# Patient Record
Sex: Female | Born: 1950 | Race: Black or African American | Hispanic: No | Marital: Single | Smoking: Never smoker
Health system: Southern US, Community
[De-identification: ages and names within clinical notes are randomized; demographics above are authoritative.]

## PROBLEM LIST (undated history)

## (undated) DIAGNOSIS — I10 Essential (primary) hypertension: Secondary | ICD-10-CM

## (undated) DIAGNOSIS — E785 Hyperlipidemia, unspecified: Secondary | ICD-10-CM

## (undated) DIAGNOSIS — F0390 Unspecified dementia without behavioral disturbance: Secondary | ICD-10-CM

## (undated) DIAGNOSIS — E119 Type 2 diabetes mellitus without complications: Secondary | ICD-10-CM

---

## 2015-07-28 ENCOUNTER — Emergency Department (HOSPITAL_COMMUNITY)
Admission: EM | Admit: 2015-07-28 | Discharge: 2015-07-28 | Disposition: A | Discharge status: Home / Self Care | Payer: Self-pay | Attending: Emergency Medicine | Admitting: Emergency Medicine

## 2015-07-28 ENCOUNTER — Encounter (HOSPITAL_COMMUNITY): Payer: Self-pay

## 2015-07-28 DIAGNOSIS — Z79899 Other long term (current) drug therapy: Secondary | ICD-10-CM | POA: Insufficient documentation

## 2015-07-28 DIAGNOSIS — Z711 Person with feared health complaint in whom no diagnosis is made: Secondary | ICD-10-CM

## 2015-07-28 DIAGNOSIS — E785 Hyperlipidemia, unspecified: Secondary | ICD-10-CM | POA: Insufficient documentation

## 2015-07-28 DIAGNOSIS — I10 Essential (primary) hypertension: Secondary | ICD-10-CM | POA: Insufficient documentation

## 2015-07-28 DIAGNOSIS — E119 Type 2 diabetes mellitus without complications: Secondary | ICD-10-CM | POA: Insufficient documentation

## 2015-07-28 DIAGNOSIS — Z09 Encounter for follow-up examination after completed treatment for conditions other than malignant neoplasm: Secondary | ICD-10-CM | POA: Insufficient documentation

## 2015-07-28 DIAGNOSIS — F319 Bipolar disorder, unspecified: Secondary | ICD-10-CM | POA: Insufficient documentation

## 2015-07-28 DIAGNOSIS — Z7982 Long term (current) use of aspirin: Secondary | ICD-10-CM | POA: Insufficient documentation

## 2015-07-28 HISTORY — DX: Hyperlipidemia, unspecified: E78.5

## 2015-07-28 HISTORY — DX: Essential (primary) hypertension: I10

## 2015-07-28 LAB — CBC WITH DIFFERENTIAL/PLATELET
BASOS ABS: 0 10*3/uL (ref 0.0–0.1)
BASOS PCT: 0 %
EOS PCT: 0 %
Eosinophils Absolute: 0 10*3/uL (ref 0.0–0.7)
HCT: 38.8 % (ref 36.0–46.0)
Hemoglobin: 12 g/dL (ref 12.0–15.0)
Lymphocytes Relative: 34 %
Lymphs Abs: 1.6 10*3/uL (ref 0.7–4.0)
MCH: 27.6 pg (ref 26.0–34.0)
MCHC: 30.9 g/dL (ref 30.0–36.0)
MCV: 89.2 fL (ref 78.0–100.0)
MONO ABS: 0.2 10*3/uL (ref 0.1–1.0)
Monocytes Relative: 5 %
NEUTROS ABS: 2.8 10*3/uL (ref 1.7–7.7)
Neutrophils Relative %: 61 %
PLATELETS: 187 10*3/uL (ref 150–400)
RBC: 4.35 MIL/uL (ref 3.87–5.11)
RDW: 13.1 % (ref 11.5–15.5)
WBC: 4.7 10*3/uL (ref 4.0–10.5)

## 2015-07-28 LAB — LITHIUM LEVEL: Lithium Lvl: 0.22 mmol/L — ABNORMAL LOW (ref 0.60–1.20)

## 2015-07-28 LAB — I-STAT CHEM 8, ED
BUN: 17 mg/dL (ref 6–20)
CALCIUM ION: 1.22 mmol/L (ref 1.13–1.30)
CHLORIDE: 106 mmol/L (ref 101–111)
CREATININE: 0.9 mg/dL (ref 0.44–1.00)
GLUCOSE: 98 mg/dL (ref 65–99)
HCT: 39 % (ref 36.0–46.0)
Hemoglobin: 13.3 g/dL (ref 12.0–15.0)
Potassium: 4 mmol/L (ref 3.5–5.1)
Sodium: 142 mmol/L (ref 135–145)
TCO2: 26 mmol/L (ref 0–100)

## 2015-07-28 LAB — COMPREHENSIVE METABOLIC PANEL
ALBUMIN: 3.6 g/dL (ref 3.5–5.0)
ALT: 34 U/L (ref 14–54)
AST: 21 U/L (ref 15–41)
Alkaline Phosphatase: 89 U/L (ref 38–126)
Anion gap: 8 (ref 5–15)
BUN: 16 mg/dL (ref 6–20)
CHLORIDE: 105 mmol/L (ref 101–111)
CO2: 28 mmol/L (ref 22–32)
Calcium: 9.7 mg/dL (ref 8.9–10.3)
Creatinine, Ser: 0.91 mg/dL (ref 0.44–1.00)
GFR calc Af Amer: >60 mL/min (ref >60)
GFR calc non Af Amer: >60 mL/min (ref >60)
GLUCOSE: 104 mg/dL — AB (ref 65–99)
POTASSIUM: 4.1 mmol/L (ref 3.5–5.1)
Sodium: 141 mmol/L (ref 135–145)
Total Bilirubin: 0.5 mg/dL (ref 0.3–1.2)
Total Protein: 6.4 g/dL — ABNORMAL LOW (ref 6.5–8.1)

## 2015-07-28 LAB — I-STAT TROPONIN, ED: Troponin i, poc: 0 ng/mL (ref 0.00–0.08)

## 2015-07-28 NOTE — Discharge Instructions (Signed)
Your potassium level is normal today.   See your doctor.   Return to ER if you have vomiting, diarrhea, chest pain, shortness of breath, fever.

## 2015-07-28 NOTE — ED Notes (Signed)
[facility]- pt here from [facility] with reported elevated potassium. Pt has no complaints but staff reported her K+ level was 10.

## 2015-07-28 NOTE — ED Provider Notes (Signed)
CSN: [id number]     Arrival date & time 07/28/15  1525 History   First MD Initiated Contact with Patient 07/28/15 1532     Chief Complaint  Patient presents with  . Abnormal Lab     (Consider location/radiation/quality/duration/timing/severity/associated sxs/prior Treatment) The history is provided by the EMS personnel.  Jenna Powell is a 64 y.o. female hx of HTN, HL, DM, bipolar here with elevated potassium. Patient had labs drawn recently and results came that K was greater than 10. Patient unable to give much history. She denies vomiting or diarrhea or chest pain or shortness of breath. Patient is from [place] home.   Level V caveat- condition of patient    Past Medical History  Diagnosis Date  . Hypertension   . Hyperlipidemia    History reviewed. No pertinent past surgical history. History reviewed. No pertinent family history. Social History  Substance Use Topics  . Smoking status: Never Smoker   . Smokeless tobacco: None  . Alcohol Use: No   OB History    No data available     Review of Systems  All other systems reviewed and are negative.     Allergies  Review of patient's allergies indicates no known allergies.  Home Medications   Prior to Admission medications   Medication Sig Start Date End Date Taking? Authorizing Provider  aspirin 81 MG EC tablet Take 81 mg by mouth daily. Swallow whole.   Yes Historical Provider, MD  benztropine (COGENTIN) 1 MG tablet Take 1 mg by mouth 2 (two) times daily.   Yes Historical Provider, MD  Calcium Polycarbophil (FIBER LAXATIVE PO) Take 1 tablet by mouth 2 (two) times daily.   Yes Historical Provider, MD  cloZAPine (CLOZARIL) 100 MG tablet Take 100-200 mg by mouth 2 (two) times daily. Takes 1 tab in am and  2 tabs in pm   Yes Historical Provider, MD  docusate (COLACE) 50 MG/5ML liquid Take 100 mg by mouth 2 (two) times daily as needed for mild constipation.   Yes Historical Provider, MD  lithium carbonate  (LITHOBID) 300 MG CR tablet Take 600 mg by mouth at bedtime.   Yes Historical Provider, MD  metFORMIN (GLUCOPHAGE) 1000 MG tablet Take 1,000 mg by mouth at bedtime.   Yes Historical Provider, MD  mirabegron ER (MYRBETRIQ) 50 MG TB24 tablet Take 50 mg by mouth daily.   Yes Historical Provider, MD  pravastatin (PRAVACHOL) 20 MG tablet Take 20 mg by mouth every evening.    Yes Historical Provider, MD  ramipril (ALTACE) 2.5 MG capsule Take 2.5 mg by mouth daily.   Yes Historical Provider, MD  risperiDONE (RISPERDAL) 3 MG tablet Take 3 mg by mouth every morning.   Yes Historical Provider, MD  risperidone (RISPERDAL) 4 MG tablet Take 4 mg by mouth at bedtime.   Yes Historical Provider, MD  sertraline (ZOLOFT) 50 MG tablet Take 50 mg by mouth daily.   Yes Historical Provider, MD   BP 114/86 mmHg  Pulse 89  Temp(Src) 98.7 F (37.1 C) (Oral)  Resp 18  SpO2 100% Physical Exam  Constitutional:  Chronically ill, NAD   HENT:  Head: Normocephalic.  Mouth/Throat: Oropharynx is clear and moist.  Eyes: Conjunctivae are normal. Pupils are equal, round, and reactive to light.  Neck: Normal range of motion. Neck supple.  Cardiovascular: Normal rate, regular rhythm and normal heart sounds.   Pulmonary/Chest: Effort normal and breath sounds normal. No respiratory distress. She has no wheezes. She has no  rales.  Abdominal: Soft. Bowel sounds are normal. She exhibits no distension. There is no tenderness. There is no rebound and no guarding.  Musculoskeletal: Normal range of motion. She exhibits no edema or tenderness.  Neurological:  Alert, moving all extremities   Skin: Skin is warm and dry.  Psychiatric:  Unable   Nursing note and vitals reviewed.   ED Course  Procedures (including critical care time) Labs Review Labs Reviewed  COMPREHENSIVE METABOLIC PANEL - Abnormal; Notable for the following:    Glucose, Bld 104 (*)    Total Protein 6.4 (*)    All other components within normal limits   LITHIUM LEVEL - Abnormal; Notable for the following:    Lithium Lvl 0.22 (*)    All other components within normal limits  CBC WITH DIFFERENTIAL/PLATELET  I-STAT CHEM 8, ED  I-STAT TROPOININ, ED    Imaging Review No results found. I have personally reviewed and evaluated these images and lab results as part of my medical decision-making.   EKG Interpretation   Date/Time:  Saturday July 28 2015 15:59:28 EST Ventricular Rate:  87 PR Interval:  179 QRS Duration: 84 QT Interval:  295 QTC Calculation: 355 R Axis:   50 Text Interpretation:  Sinus rhythm Borderline low voltage, extremity leads  Abnormal R-wave progression, early transition No previous ECGs available  Confirmed by YAO  MD, DAVID ([id number]) on 07/28/2015 4:03:06 PM      MDM   Final diagnoses:  None   Jenna Powell is a 64 y.o. female here with hyperkalemia. Hemodynamically stable. Not sure if its hemolyzed or not. Will recheck labs, EKG.   5:16 PM  K 4.0. Lithium 0.22 slightly subtherapeutic. Stable for dc.     Wandra Arthurs, MD 07/28/15 [phone]

## 2015-07-28 NOTE — ED Notes (Signed)
PT DEPARTED IN NAD.

## 2015-08-21 ENCOUNTER — Encounter (HOSPITAL_COMMUNITY): Payer: Self-pay | Admitting: *Deleted

## 2015-08-21 ENCOUNTER — Inpatient Hospital Stay (HOSPITAL_COMMUNITY): Payer: Medicare Other

## 2015-08-21 ENCOUNTER — Inpatient Hospital Stay (HOSPITAL_COMMUNITY)
Admission: AD | Admit: 2015-08-21 | Discharge: 2015-08-21 | Disposition: A | Discharge status: Skilled Nursing Facility | Payer: Medicare Other | Source: Ambulatory Visit | Attending: Family Medicine | Admitting: Family Medicine

## 2015-08-21 DIAGNOSIS — N362 Urethral caruncle: Secondary | ICD-10-CM

## 2015-08-21 DIAGNOSIS — I10 Essential (primary) hypertension: Secondary | ICD-10-CM | POA: Diagnosis not present

## 2015-08-21 DIAGNOSIS — R319 Hematuria, unspecified: Secondary | ICD-10-CM | POA: Diagnosis present

## 2015-08-21 DIAGNOSIS — N950 Postmenopausal bleeding: Secondary | ICD-10-CM | POA: Diagnosis not present

## 2015-08-21 DIAGNOSIS — N814 Uterovaginal prolapse, unspecified: Secondary | ICD-10-CM | POA: Diagnosis not present

## 2015-08-21 DIAGNOSIS — E785 Hyperlipidemia, unspecified: Secondary | ICD-10-CM | POA: Insufficient documentation

## 2015-08-21 LAB — URINE MICROSCOPIC-ADD ON

## 2015-08-21 LAB — URINALYSIS, ROUTINE W REFLEX MICROSCOPIC
BILIRUBIN URINE: NEGATIVE
Glucose, UA: NEGATIVE mg/dL
KETONES UR: NEGATIVE mg/dL
NITRITE: NEGATIVE
PH: 7 (ref 5.0–8.0)
PROTEIN: NEGATIVE mg/dL
Specific Gravity, Urine: 1.015 (ref 1.005–1.030)

## 2015-08-21 MED ORDER — SULFAMETHOXAZOLE-TRIMETHOPRIM 800-160 MG PO TABS: 1.0000 | ORAL_TABLET | Freq: Two times a day (BID) | ORAL | Status: DC

## 2015-08-21 NOTE — Progress Notes (Signed)
Jenna Powell CNM in to discuss test results and d/c plan with pt. Will send written info with pt to Assisted living facility.

## 2015-08-21 NOTE — MAU Note (Addendum)
Seeing blood in urine for couple days. Pt arrived via stretcher from assisted living facility. Pt speaks French-Creole but does speak [place] as well.

## 2015-08-21 NOTE — Progress Notes (Signed)
Pt d/c with [facility] to return to [facility]. EMS personnel has d/c papers and prescription. Report called to RN at [facility] by M. Jimmye Norman CNM

## 2015-08-21 NOTE — MAU Provider Note (Signed)
Chief Complaint:  Hematuria  First Provider Initiated Contact at 1200    HPI  Jenna Powell is a 65 y.o. G7P7 who presents to maternity admissions reporting bleeding when urinating.  Has some blood on clothing, but is thinking it is from urination.  States it started a few days ago. Denies pain or difficulty urinating.  She reports no vaginal bleeding, vaginal itching/burning, urinary symptoms, h/a, dizziness, n/v, or fever/chills.    She arrived by ambulance from [facility].   RN Note: Seeing blood in urine for couple days. Pt arrived via stretcher from assisted living facility. Pt speaks French-Creole but does speak [place] as well.          Past Medical History: Past Medical History  Diagnosis Date  . Hypertension   . Hyperlipidemia     Past obstetric history: OB History  Gravida Para Term Preterm AB SAB TAB Ectopic Multiple Living  7         7    # Outcome Date GA Lbr Len/2nd Weight Sex Delivery Anes PTL Lv  7 Gravida           6 Gravida           5 Gravida           4 Gravida           3 Gravida           2 Gravida           1 [place]               Past Surgical History: History reviewed. No pertinent past surgical history.  Family History: History reviewed. No pertinent family history.  Social History: Social History  Substance Use Topics  . Smoking status: Never Smoker   . Smokeless tobacco: None  . Alcohol Use: No    Allergies: No Known Allergies  Meds:  Prescriptions prior to admission  Medication Sig Dispense Refill Last Dose  . aspirin 81 MG EC tablet Take 81 mg by mouth daily. Swallow whole.   08/21/2015 at Unknown time  . benztropine (COGENTIN) 1 MG tablet Take 1 mg by mouth 2 (two) times daily.   08/21/2015 at Unknown time  . Calcium Carbonate-Vitamin D (CALCIUM 600+D) 600-400 MG-UNIT tablet Take 1 tablet by mouth every 12 (twelve) hours.   08/21/2015 at Unknown time  . Calcium Polycarbophil (FIBER LAXATIVE PO) Take 1 tablet by mouth 2  (two) times daily.   08/21/2015 at Unknown time  . cloZAPine (CLOZARIL) 100 MG tablet Take 100-200 mg by mouth 2 (two) times daily. Takes 1 tab in am and  2 tabs in pm   08/21/2015 at Unknown time  . docusate (COLACE) 50 MG/5ML liquid Take 100 mg by mouth 3 (three) times daily.    08/21/2015 at Unknown time  . donepezil (ARICEPT) 10 MG tablet Take 10 mg by mouth at bedtime.   08/20/2015 at Unknown time  . lithium carbonate (LITHOBID) 300 MG CR tablet Take 600 mg by mouth at bedtime.   08/20/2015 at Unknown time  . metFORMIN (GLUCOPHAGE) 1000 MG tablet Take 1,000 mg by mouth at bedtime.   08/20/2015 at Unknown time  . metoprolol tartrate (LOPRESSOR) 25 MG tablet Take 12.5 mg by mouth 2 (two) times daily.   08/21/2015 at Unknown time  . mirabegron ER (MYRBETRIQ) 50 MG TB24 tablet Take 50 mg by mouth daily.   08/20/2015 at Unknown time  . pravastatin (PRAVACHOL) 20 MG tablet Take 20  mg by mouth every evening.    08/20/2015 at Unknown time  . ramipril (ALTACE) 2.5 MG capsule Take 2.5 mg by mouth daily.   08/21/2015 at Unknown time  . risperidone (RISPERDAL) 4 MG tablet Take 4 mg by mouth 2 (two) times daily.    08/21/2015 at Unknown time  . sertraline (ZOLOFT) 50 MG tablet Take 50 mg by mouth daily.   08/21/2015 at Unknown time   I have reviewed patient's Past Medical Hx, Surgical Hx, Family Hx, Social Hx, medications and allergies.   ROS:  Review of Systems  Constitutional: Negative for fever, chills and fatigue.  Gastrointestinal: Negative for nausea, vomiting, abdominal pain, diarrhea and constipation.  Genitourinary: Positive for vaginal bleeding (questionable if it is vaginal or urinary). Negative for dysuria, frequency, flank pain, vaginal discharge, difficulty urinating and pelvic pain.  Musculoskeletal: Negative for myalgias and back pain.  Other systems negative  Physical Exam  Patient Vitals for the past 24 hrs:  BP Temp Pulse Resp SpO2  08/21/15 1118 - - - - 100 %  08/21/15 1113 106/55 mmHg -  73 - -  08/21/15 1111 - 99.2 F (37.3 C) - 18 -   Constitutional: Well-developed, well-nourished female in no acute distress.  Cardiovascular: normal rate and rhythm, no ectopy audible, S1 & S2 heard Respiratory: normal effort, no distress. Lungs CTAB with no wheezes or crackles GI: Abd soft, non-tender.  Nondistended.  No rebound, No guarding.  Bowel Sounds audible  MS: Extremities nontender, no edema, normal ROM Neurologic: Alert and oriented x 4.   Grossly nonfocal. GU: Neg CVAT. Skin:  Warm and Dry Psych:  Affect appropriate.  PELVIC EXAM: Cervix pink, Protruding through introitus, closed, without lesion, scant clear discharge, vaginal walls and external genitalia normal except for a very small reddened tissue on clitoral hood, to patient's right.  Urethra is normal except for small polypoid tissue at posterior edge.  Bimanual exam: Cervix firm, completely prolapsed at rest, but reduces easily, nontender, pelvic exam limited by habitus    Labs: Results for orders placed or performed during the hospital encounter of 08/21/15 (from the past 24 hour(s))  Urinalysis, Routine w reflex microscopic (not at [facility])     Status: Abnormal   Collection Time: 08/21/15 11:35 AM  Result Value Ref Range   Color, Urine YELLOW YELLOW   APPearance CLEAR CLEAR   Specific Gravity, Urine 1.015 1.005 - 1.030   pH 7.0 5.0 - 8.0   Glucose, UA NEGATIVE NEGATIVE mg/dL   Hgb urine dipstick TRACE (A) NEGATIVE   Bilirubin Urine NEGATIVE NEGATIVE   Ketones, ur NEGATIVE NEGATIVE mg/dL   Protein, ur NEGATIVE NEGATIVE mg/dL   Nitrite NEGATIVE NEGATIVE   Leukocytes, UA SMALL (A) NEGATIVE  Urine microscopic-add on     Status: Abnormal   Collection Time: 08/21/15 11:35 AM  Result Value Ref Range   Squamous Epithelial / LPF 0-5 (A) NONE SEEN   WBC, UA 0-5 0 - 5 WBC/hpf   RBC / HPF 0-5 0 - 5 RBC/hpf   Bacteria, UA FEW (A) NONE SEEN    Imaging:   MAU Course/MDM: I have ordered labs as follows:  UA   Imaging ordered: Pelvic ultrasound, primarily to measure endometrial stripe Results reviewed.   Consult Dr Kennon Rounds.  Will not need referral to clinic for endometrial biopsy since endometrium so thin, but will probably need urological evaluation of possible urethral polyp.   Treatments in MAU included Ultrasound.   Pt stable at time of discharge.  Assessment: 1. Postmenopausal vaginal bleeding   2.    Doubt vaginal source of bleeding. Exam was negative (no blood seen) and endometrial stripe was very thin 3.    Uterine Prolapse with cervix protruding.  This could cause some irritation but I see no signs of irritation 4.    Possible urethral polyp or diverticulum  Small area of tissue at 6:00 position on urethra, if it is a polyp, could bleed 5.    Hematuria and small leukocytes in urine, Possible cystitis.   Plan: Discharge home Will send urine to culture Will treat presumptively for UTI just incase  Recommend Referral to Urology to evaluate urethra Rx sent for Bactrim DS for 3 day treatment of possible UTI      Medication List    ASK your doctor about these medications        aspirin 81 MG EC tablet  Take 81 mg by mouth daily. Swallow whole.     benztropine 1 MG tablet  Commonly known as:  COGENTIN  Take 1 mg by mouth 2 (two) times daily.     CALCIUM 600+D 600-400 MG-UNIT tablet  Generic drug:  Calcium Carbonate-Vitamin D  Take 1 tablet by mouth every 12 (twelve) hours.     cloZAPine 100 MG tablet  Commonly known as:  CLOZARIL  Take 100-200 mg by mouth 2 (two) times daily. Takes 1 tab in am and  2 tabs in pm     docusate 50 MG/5ML liquid  Commonly known as:  COLACE  Take 100 mg by mouth 3 (three) times daily.     donepezil 10 MG tablet  Commonly known as:  ARICEPT  Take 10 mg by mouth at bedtime.     FIBER LAXATIVE PO  Take 1 tablet by mouth 2 (two) times daily.     lithium carbonate 300 MG CR tablet  Commonly known as:  LITHOBID  Take 600 mg by mouth at  bedtime.     metFORMIN 1000 MG tablet  Commonly known as:  GLUCOPHAGE  Take 1,000 mg by mouth at bedtime.     metoprolol tartrate 25 MG tablet  Commonly known as:  LOPRESSOR  Take 12.5 mg by mouth 2 (two) times daily.     MYRBETRIQ 50 MG Tb24 tablet  Generic drug:  mirabegron ER  Take 50 mg by mouth daily.     pravastatin 20 MG tablet  Commonly known as:  PRAVACHOL  Take 20 mg by mouth every evening.     ramipril 2.5 MG capsule  Commonly known as:  ALTACE  Take 2.5 mg by mouth daily.     risperidone 4 MG tablet  Commonly known as:  RISPERDAL  Take 4 mg by mouth 2 (two) times daily.     sertraline 50 MG tablet  Commonly known as:  ZOLOFT  Take 50 mg by mouth daily.       Encouraged to return here or to other Urgent Care/ED if she develops worsening of symptoms, increase in pain, fever, or other concerning symptoms.   Hansel Feinstein CNM, MSN Certified Nurse-Midwife 08/21/2015 12:04 PM

## 2015-08-21 NOTE — MAU Note (Signed)
[facility] called to transport pt back to [facility]. Hansel Feinstein CNM calling [place] to give report to nursing staff regarding pt's dx and plan of care

## 2015-08-21 NOTE — Progress Notes (Signed)
Prolapsed uterus.

## 2015-08-21 NOTE — Discharge Instructions (Signed)
Hematuria, Adult Hematuria is blood in your urine. It can be caused by a bladder infection, kidney infection, prostate infection, kidney stone, or cancer of your urinary tract. Infections can usually be treated with medicine, and a kidney stone usually will pass through your urine. If neither of these is the cause of your hematuria, further workup to find out the reason may be needed. It is very important that you tell your health care provider about any blood you see in your urine, even if the blood stops without treatment or happens without causing pain. Blood in your urine that happens and then stops and then happens again can be a symptom of a very serious condition. Also, pain is not a symptom in the initial stages of many urinary cancers. HOME CARE INSTRUCTIONS   Drink lots of fluid, 3-4 quarts a day. If you have been diagnosed with an infection, cranberry juice is especially recommended, in addition to large amounts of water.  Avoid caffeine, tea, and carbonated beverages because they tend to irritate the bladder.  Avoid alcohol because it may irritate the prostate.  Take all medicines as directed by your health care provider.  If you were prescribed an antibiotic medicine, finish it all even if you start to feel better.  If you have been diagnosed with a kidney stone, follow your health care provider's instructions regarding straining your urine to catch the stone.  Empty your bladder often. Avoid holding urine for long periods of time.  After a bowel movement, women should cleanse front to back. Use each tissue only once.  Empty your bladder before and after sexual intercourse if you are a female. SEEK MEDICAL CARE IF:  You develop back pain.  You have a fever.  You have a feeling of sickness in your stomach (nausea) or vomiting.  Your symptoms are not better in 3 days. Return sooner if you are getting worse. SEEK IMMEDIATE MEDICAL CARE IF:   You develop severe vomiting and  are unable to keep the medicine down.  You develop severe back or abdominal pain despite taking your medicines.  You begin passing a large amount of blood or clots in your urine.  You feel extremely weak or faint, or you pass out. MAKE SURE YOU:   Understand these instructions.  Will watch your condition.  Will get help right away if you are not doing well or get worse.   This information is not intended to replace advice given to you by your health care provider. Make sure you discuss any questions you have with your health care provider.   Document Released: 07/21/2005 Document Revised: 08/11/2014 Document Reviewed: 03/21/2013 Elsevier Interactive Patient Education 2016 [employer].  Asymptomatic Bacteriuria, Female Asymptomatic bacteriuria is the presence of a large number of bacteria in your urine without the usual symptoms of burning or frequent urination. The following conditions increase the risk of asymptomatic bacteriuria:  Diabetes mellitus.  Advanced age.  Pregnancy in the first trimester.  Kidney stones.  Kidney transplants.  Leaky kidney tube valve in young children (reflux). Treatment for this condition is not needed in most people and can lead to other problems such as too much yeast and growth of resistant bacteria. However, some people, such as pregnant women, do need treatment to prevent kidney infection. Asymptomatic bacteriuria in pregnancy is also associated with fetal growth restriction, premature labor, and newborn death. HOME CARE INSTRUCTIONS Monitor your condition for any changes. The following actions may help to relieve any discomfort you are  feeling:  Drink enough water and fluids to keep your urine clear or pale yellow. Go to the bathroom more often to keep your bladder empty.  Keep the area around your vagina and rectum clean. Wipe yourself from front to back after urinating. SEEK IMMEDIATE MEDICAL CARE IF:  You develop signs of an infection  such as:  Burning with urination.  Frequency of voiding.  Back pain.  Fever.  You have blood in the urine.  You develop a fever. MAKE SURE YOU:  Understand these instructions.  Will watch your condition.  Will get help right away if you are not doing well or get worse.   This information is not intended to replace advice given to you by your health care provider. Make sure you discuss any questions you have with your health care provider.   Document Released: 07/21/2005 Document Revised: 08/11/2014 Document Reviewed: 01/10/2013 Elsevier Interactive Patient Education [employer].

## 2015-08-22 LAB — URINE CULTURE: SPECIAL REQUESTS: NORMAL

## 2016-01-11 ENCOUNTER — Encounter (HOSPITAL_COMMUNITY): Payer: Self-pay

## 2016-01-11 ENCOUNTER — Emergency Department (HOSPITAL_COMMUNITY)
Admission: EM | Admit: 2016-01-11 | Discharge: 2016-01-11 | Disposition: A | Discharge status: Home / Self Care | Payer: Medicare Other | Attending: Emergency Medicine | Admitting: Emergency Medicine

## 2016-01-11 DIAGNOSIS — Z7982 Long term (current) use of aspirin: Secondary | ICD-10-CM | POA: Diagnosis not present

## 2016-01-11 DIAGNOSIS — Z7984 Long term (current) use of oral hypoglycemic drugs: Secondary | ICD-10-CM | POA: Diagnosis not present

## 2016-01-11 DIAGNOSIS — E785 Hyperlipidemia, unspecified: Secondary | ICD-10-CM | POA: Diagnosis not present

## 2016-01-11 DIAGNOSIS — Z79899 Other long term (current) drug therapy: Secondary | ICD-10-CM | POA: Diagnosis not present

## 2016-01-11 DIAGNOSIS — I10 Essential (primary) hypertension: Secondary | ICD-10-CM | POA: Diagnosis not present

## 2016-01-11 DIAGNOSIS — Z792 Long term (current) use of antibiotics: Secondary | ICD-10-CM | POA: Diagnosis not present

## 2016-01-11 DIAGNOSIS — F0390 Unspecified dementia without behavioral disturbance: Secondary | ICD-10-CM | POA: Insufficient documentation

## 2016-01-11 DIAGNOSIS — R319 Hematuria, unspecified: Secondary | ICD-10-CM | POA: Diagnosis present

## 2016-01-11 HISTORY — DX: Unspecified dementia, unspecified severity, without behavioral disturbance, psychotic disturbance, mood disturbance, and anxiety: F03.90

## 2016-01-11 LAB — CBC WITH DIFFERENTIAL/PLATELET
Basophils Absolute: 0 10*3/uL (ref 0.0–0.1)
Basophils Relative: 0 %
EOS PCT: 0 %
Eosinophils Absolute: 0 10*3/uL (ref 0.0–0.7)
HEMATOCRIT: 36.3 % (ref 36.0–46.0)
Hemoglobin: 11.3 g/dL — ABNORMAL LOW (ref 12.0–15.0)
LYMPHS PCT: 30 %
Lymphs Abs: 1.7 10*3/uL (ref 0.7–4.0)
MCH: 26.9 pg (ref 26.0–34.0)
MCHC: 31.1 g/dL (ref 30.0–36.0)
MCV: 86.4 fL (ref 78.0–100.0)
MONO ABS: 0.4 10*3/uL (ref 0.1–1.0)
MONOS PCT: 7 %
NEUTROS ABS: 3.6 10*3/uL (ref 1.7–7.7)
Neutrophils Relative %: 63 %
PLATELETS: 190 10*3/uL (ref 150–400)
RBC: 4.2 MIL/uL (ref 3.87–5.11)
RDW: 12.5 % (ref 11.5–15.5)
WBC: 5.6 10*3/uL (ref 4.0–10.5)

## 2016-01-11 LAB — BASIC METABOLIC PANEL
Anion gap: 7 (ref 5–15)
BUN: 14 mg/dL (ref 6–20)
CALCIUM: 9.6 mg/dL (ref 8.9–10.3)
CO2: 24 mmol/L (ref 22–32)
CREATININE: 0.91 mg/dL (ref 0.44–1.00)
Chloride: 106 mmol/L (ref 101–111)
GFR calc Af Amer: >60 mL/min (ref >60)
GFR calc non Af Amer: >60 mL/min (ref >60)
Glucose, Bld: 101 mg/dL — ABNORMAL HIGH (ref 65–99)
Potassium: 4.4 mmol/L (ref 3.5–5.1)
Sodium: 137 mmol/L (ref 135–145)

## 2016-01-11 LAB — URINALYSIS, ROUTINE W REFLEX MICROSCOPIC
BILIRUBIN URINE: NEGATIVE
GLUCOSE, UA: NEGATIVE mg/dL
KETONES UR: NEGATIVE mg/dL
Leukocytes, UA: NEGATIVE
Nitrite: NEGATIVE
PH: 6.5 (ref 5.0–8.0)
Protein, ur: NEGATIVE mg/dL
Specific Gravity, Urine: 1.01 (ref 1.005–1.030)

## 2016-01-11 LAB — URINE MICROSCOPIC-ADD ON: WBC, UA: NONE SEEN WBC/hpf (ref 0–5)

## 2016-01-11 NOTE — ED Provider Notes (Signed)
CSN: [id number]     Arrival date & time 01/11/16  1732 History   First MD Initiated Contact with Patient 01/11/16 1737     Chief Complaint  Patient presents with  . Hematuria     (Consider location/radiation/quality/duration/timing/severity/associated sxs/prior Treatment) HPI Comments: Patient with past medical history of hypertension, hyperlipidemia, and dementia presents emergency department from SNF with chief complaint of hematuria. Per the nursing home, the patient has been acting more altered than normal. They note that she had some blood in her urine which was seen in a diaper today. No reported blood in stool. No bleeding on exam. Patient is fluid and several languages, and is able to communicate in English if redirected. She states that she is not in pain. She denies chest pain, shortness breath, or abdominal pain. Denies any symptoms at this time. States that she does need to urinate.  The history is provided by the patient. No language interpreter was used.    Past Medical History  Diagnosis Date  . Hypertension   . Hyperlipidemia   . Dementia    History reviewed. No pertinent past surgical history. History reviewed. No pertinent family history. Social History  Substance Use Topics  . Smoking status: Never Smoker   . Smokeless tobacco: None  . Alcohol Use: No   OB History    Gravida Para Term Preterm AB TAB SAB Ectopic Multiple Living   7         7     Review of Systems  Constitutional: Negative for fever and chills.  Respiratory: Negative for shortness of breath.   Cardiovascular: Negative for chest pain.  Gastrointestinal: Negative for nausea, vomiting, diarrhea and constipation.  Genitourinary: Positive for hematuria. Negative for dysuria.  All other systems reviewed and are negative.     Allergies  Review of patient's allergies indicates no known allergies.  Home Medications   Prior to Admission medications   Medication Sig Start Date End Date Taking?  Authorizing Provider  aspirin 81 MG EC tablet Take 81 mg by mouth daily. Swallow whole.    Historical Provider, MD  benztropine (COGENTIN) 1 MG tablet Take 1 mg by mouth 2 (two) times daily.    Historical Provider, MD  Calcium Carbonate-Vitamin D (CALCIUM 600+D) 600-400 MG-UNIT tablet Take 1 tablet by mouth every 12 (twelve) hours.    Historical Provider, MD  Calcium Polycarbophil (FIBER LAXATIVE PO) Take 1 tablet by mouth 2 (two) times daily.    Historical Provider, MD  cloZAPine (CLOZARIL) 100 MG tablet Take 100-200 mg by mouth 2 (two) times daily. Takes 1 tab in am and  2 tabs in pm    Historical Provider, MD  docusate (COLACE) 50 MG/5ML liquid Take 100 mg by mouth 3 (three) times daily.     Historical Provider, MD  donepezil (ARICEPT) 10 MG tablet Take 10 mg by mouth at bedtime.    Historical Provider, MD  lithium carbonate (LITHOBID) 300 MG CR tablet Take 600 mg by mouth at bedtime.    Historical Provider, MD  metFORMIN (GLUCOPHAGE) 1000 MG tablet Take 1,000 mg by mouth at bedtime.    Historical Provider, MD  metoprolol tartrate (LOPRESSOR) 25 MG tablet Take 12.5 mg by mouth 2 (two) times daily.    Historical Provider, MD  mirabegron ER (MYRBETRIQ) 50 MG TB24 tablet Take 50 mg by mouth daily.    Historical Provider, MD  pravastatin (PRAVACHOL) 20 MG tablet Take 20 mg by mouth every evening.     Historical Provider,  MD  ramipril (ALTACE) 2.5 MG capsule Take 2.5 mg by mouth daily.    Historical Provider, MD  risperidone (RISPERDAL) 4 MG tablet Take 4 mg by mouth 2 (two) times daily.     Historical Provider, MD  sertraline (ZOLOFT) 50 MG tablet Take 50 mg by mouth daily.    Historical Provider, MD  sulfamethoxazole-trimethoprim (BACTRIM DS,SEPTRA DS) 800-160 MG tablet Take 1 tablet by mouth 2 (two) times daily. 08/21/15   Seabron Spates, CNM   BP 111/78 mmHg  Pulse 83  Temp(Src) 97.6 F (36.4 C) (Oral)  Resp 16  Ht 5\' 1"  (1.549 m)  Wt 99.1 kg  BMI 41.30 kg/m2  SpO2 98% Physical Exam   Constitutional: She appears well-developed and well-nourished.  HENT:  Head: Normocephalic and atraumatic.  Eyes: Conjunctivae and EOM are normal. Pupils are equal, round, and reactive to light.  Neck: Normal range of motion. Neck supple.  Cardiovascular: Normal rate and regular rhythm.  Exam reveals no gallop and no friction rub.   No murmur heard. Pulmonary/Chest: Effort normal and breath sounds normal. No respiratory distress. She has no wheezes. She has no rales. She exhibits no tenderness.  CTAB  Abdominal: Soft. Bowel sounds are normal. She exhibits no distension and no mass. There is no tenderness. There is no rebound and no guarding.  No focal abdominal tenderness, no RLQ tenderness or pain at McBurney's point, no RUQ tenderness or Murphy's sign, no left-sided abdominal tenderness, no fluid wave, or signs of peritonitis   Musculoskeletal: Normal range of motion. She exhibits no edema or tenderness.  Neurological: She is alert.  Pleasantly demented  Skin: Skin is warm and dry.  No rashes or lesions  Psychiatric: She has a normal mood and affect. Her behavior is normal. Judgment and thought content normal.  Nursing note and vitals reviewed.   ED Course  Procedures (including critical care time) Results for orders placed or performed during the hospital encounter of 01/11/16  Urinalysis, Routine w reflex microscopic- may I&O cath if menses  Result Value Ref Range   Color, Urine YELLOW YELLOW   APPearance CLEAR CLEAR   Specific Gravity, Urine 1.010 1.005 - 1.030   pH 6.5 5.0 - 8.0   Glucose, UA NEGATIVE NEGATIVE mg/dL   Hgb urine dipstick MODERATE (A) NEGATIVE   Bilirubin Urine NEGATIVE NEGATIVE   Ketones, ur NEGATIVE NEGATIVE mg/dL   Protein, ur NEGATIVE NEGATIVE mg/dL   Nitrite NEGATIVE NEGATIVE   Leukocytes, UA NEGATIVE NEGATIVE  Basic metabolic panel  Result Value Ref Range   Sodium 137 135 - 145 mmol/L   Potassium 4.4 3.5 - 5.1 mmol/L   Chloride 106 101 - 111  mmol/L   CO2 24 22 - 32 mmol/L   Glucose, Bld 101 (H) 65 - 99 mg/dL   BUN 14 6 - 20 mg/dL   Creatinine, Ser 0.91 0.44 - 1.00 mg/dL   Calcium 9.6 8.9 - 10.3 mg/dL   GFR calc non Af Amer >60 >60 mL/min   GFR calc Af Amer >60 >60 mL/min   Anion gap 7 5 - 15  CBC with Differential/Platelet  Result Value Ref Range   WBC 5.6 4.0 - 10.5 K/uL   RBC 4.20 3.87 - 5.11 MIL/uL   Hemoglobin 11.3 (L) 12.0 - 15.0 g/dL   HCT 36.3 36.0 - 46.0 %   MCV 86.4 78.0 - 100.0 fL   MCH 26.9 26.0 - 34.0 pg   MCHC 31.1 30.0 - 36.0 g/dL   RDW 12.5  11.5 - 15.5 %   Platelets 190 150 - 400 K/uL   Neutrophils Relative % 63 %   Neutro Abs 3.6 1.7 - 7.7 K/uL   Lymphocytes Relative 30 %   Lymphs Abs 1.7 0.7 - 4.0 K/uL   Monocytes Relative 7 %   Monocytes Absolute 0.4 0.1 - 1.0 K/uL   Eosinophils Relative 0 %   Eosinophils Absolute 0.0 0.0 - 0.7 K/uL   Basophils Relative 0 %   Basophils Absolute 0.0 0.0 - 0.1 K/uL  Urine microscopic-add on  Result Value Ref Range   Squamous Epithelial / LPF 0-5 (A) NONE SEEN   WBC, UA NONE SEEN 0 - 5 WBC/hpf   RBC / HPF 0-5 0 - 5 RBC/hpf   Bacteria, UA RARE (A) NONE SEEN   No results found.  I have personally reviewed and evaluated these images and lab results as part of my medical decision-making.   MDM   Final diagnoses:  Hematuria    Patient with history of dementia, thought to be acting more confused than normal per nursing home staff. There was concerns over hematuria today. Check labs and urinalysis. Patient is pleasantly demented on exam, but denies any pain.  HGB is baseline.  UA positive for hematuria, but no evidence of infection.  Will check urine culture.  Recommend urology follow-up.  Patient seen by and discussed with Dr. Eulis Foster, who agrees with the plan.   Montine Circle, PA-C 01/11/16 2008  Daleen Bo, MD 01/12/16 [phone]

## 2016-01-11 NOTE — ED Notes (Signed)
Rob PA in room 

## 2016-01-11 NOTE — ED Notes (Signed)
This Rn and EMT assisted patient to bedside commode. Upon cleaning patient this RN noted blood on the kleenex. Pt assisted back to bed.

## 2016-01-11 NOTE — Discharge Instructions (Signed)
You laboratory tests today look good.  There is no evidence of infection in your urine.  You will need to follow-up with a urologist to find what the cause of the blood in your urine is.  Hematuria, Adult Hematuria is blood in your urine. It can be caused by a bladder infection, kidney infection, prostate infection, kidney stone, or cancer of your urinary tract. Infections can usually be treated with medicine, and a kidney stone usually will pass through your urine. If neither of these is the cause of your hematuria, further workup to find out the reason may be needed. It is very important that you tell your health care provider about any blood you see in your urine, even if the blood stops without treatment or happens without causing pain. Blood in your urine that happens and then stops and then happens again can be a symptom of a very serious condition. Also, pain is not a symptom in the initial stages of many urinary cancers. HOME CARE INSTRUCTIONS   Drink lots of fluid, 3-4 quarts a day. If you have been diagnosed with an infection, cranberry juice is especially recommended, in addition to large amounts of water.  Avoid caffeine, tea, and carbonated beverages because they tend to irritate the bladder.  Avoid alcohol because it may irritate the prostate.  Take all medicines as directed by your health care provider.  If you were prescribed an antibiotic medicine, finish it all even if you start to feel better.  If you have been diagnosed with a kidney stone, follow your health care provider's instructions regarding straining your urine to catch the stone.  Empty your bladder often. Avoid holding urine for long periods of time.  After a bowel movement, women should cleanse front to back. Use each tissue only once.  Empty your bladder before and after sexual intercourse if you are a female. SEEK MEDICAL CARE IF:  You develop back pain.  You have a fever.  You have a feeling of sickness  in your stomach (nausea) or vomiting.  Your symptoms are not better in 3 days. Return sooner if you are getting worse. SEEK IMMEDIATE MEDICAL CARE IF:   You develop severe vomiting and are unable to keep the medicine down.  You develop severe back or abdominal pain despite taking your medicines.  You begin passing a large amount of blood or clots in your urine.  You feel extremely weak or faint, or you pass out. MAKE SURE YOU:   Understand these instructions.  Will watch your condition.  Will get help right away if you are not doing well or get worse.   This information is not intended to replace advice given to you by your health care provider. Make sure you discuss any questions you have with your health care provider.   Document Released: 07/21/2005 Document Revised: 08/11/2014 Document Reviewed: 03/21/2013 Elsevier Interactive Patient Education [employer].

## 2016-01-11 NOTE — ED Provider Notes (Signed)
  Face-to-face evaluation   History: She presents for evaluation of blood noted, on routine diaper care. No other reported problem. Patient has dementia. Patient denies pain. She is comfortable.  Physical exam: Alert, elderly female. Abdomen nontender to palpation. No respiratory distress.  Medical screening examination/treatment/procedure(s) were conducted as a shared visit with non-physician practitioner(s) and myself.  I personally evaluated the patient during the encounter  Daleen Bo, MD 01/12/16 [phone]

## 2016-01-11 NOTE — ED Notes (Signed)
Per EMS, Pt from [facility], Blood found in diaper this morning by nursing staff. Staff states that the pt is a little more confused than normal. Pt has dementia. VSS. BP 118/68, HR 70, CBG 104, 97% on RA. Oriented to self.

## 2016-01-13 LAB — URINE CULTURE

## 2016-03-27 ENCOUNTER — Encounter: Payer: Self-pay | Admitting: Obstetrics & Gynecology

## 2016-05-08 ENCOUNTER — Encounter: Payer: Medicare Other | Admitting: Obstetrics & Gynecology

## 2016-10-22 ENCOUNTER — Encounter (HOSPITAL_COMMUNITY): Payer: Self-pay | Admitting: Emergency Medicine

## 2016-10-22 ENCOUNTER — Emergency Department (HOSPITAL_COMMUNITY): Payer: Medicare Other

## 2016-10-22 ENCOUNTER — Emergency Department (HOSPITAL_COMMUNITY)
Admission: EM | Admit: 2016-10-22 | Discharge: 2016-10-22 | Disposition: A | Discharge status: Home / Self Care | Payer: Medicare Other | Attending: Emergency Medicine | Admitting: Emergency Medicine

## 2016-10-22 DIAGNOSIS — R401 Stupor: Secondary | ICD-10-CM | POA: Diagnosis not present

## 2016-10-22 DIAGNOSIS — I10 Essential (primary) hypertension: Secondary | ICD-10-CM | POA: Diagnosis not present

## 2016-10-22 DIAGNOSIS — Z79899 Other long term (current) drug therapy: Secondary | ICD-10-CM | POA: Diagnosis not present

## 2016-10-22 DIAGNOSIS — Z7982 Long term (current) use of aspirin: Secondary | ICD-10-CM | POA: Insufficient documentation

## 2016-10-22 DIAGNOSIS — R4020 Unspecified coma: Secondary | ICD-10-CM | POA: Diagnosis present

## 2016-10-22 LAB — CBC
HEMATOCRIT: 36.8 % (ref 36.0–46.0)
Hemoglobin: 11.7 g/dL — ABNORMAL LOW (ref 12.0–15.0)
MCH: 27.3 pg (ref 26.0–34.0)
MCHC: 31.8 g/dL (ref 30.0–36.0)
MCV: 86 fL (ref 78.0–100.0)
Platelets: 166 10*3/uL (ref 150–400)
RBC: 4.28 MIL/uL (ref 3.87–5.11)
RDW: 13.1 % (ref 11.5–15.5)
WBC: 3.3 10*3/uL — ABNORMAL LOW (ref 4.0–10.5)

## 2016-10-22 LAB — SALICYLATE LEVEL

## 2016-10-22 LAB — URINALYSIS, ROUTINE W REFLEX MICROSCOPIC
Bilirubin Urine: NEGATIVE
GLUCOSE, UA: NEGATIVE mg/dL
HGB URINE DIPSTICK: NEGATIVE
Ketones, ur: NEGATIVE mg/dL
Leukocytes, UA: NEGATIVE
Nitrite: NEGATIVE
PH: 6 (ref 5.0–8.0)
PROTEIN: NEGATIVE mg/dL
SPECIFIC GRAVITY, URINE: 1.004 — AB (ref 1.005–1.030)

## 2016-10-22 LAB — CK TOTAL AND CKMB (NOT AT ARMC)
CK TOTAL: 97 U/L (ref 38–234)
CK, MB: 1.1 ng/mL (ref 0.5–5.0)
RELATIVE INDEX: INVALID (ref 0.0–2.5)

## 2016-10-22 LAB — COMPREHENSIVE METABOLIC PANEL
ALBUMIN: 3.9 g/dL (ref 3.5–5.0)
ALK PHOS: 85 U/L (ref 38–126)
ALT: 18 U/L (ref 14–54)
ANION GAP: 6 (ref 5–15)
AST: 21 U/L (ref 15–41)
BILIRUBIN TOTAL: 0.3 mg/dL (ref 0.3–1.2)
BUN: 11 mg/dL (ref 6–20)
CALCIUM: 9.3 mg/dL (ref 8.9–10.3)
CO2: 28 mmol/L (ref 22–32)
Chloride: 106 mmol/L (ref 101–111)
Creatinine, Ser: 0.89 mg/dL (ref 0.44–1.00)
GFR calc non Af Amer: >60 mL/min (ref >60)
GLUCOSE: 146 mg/dL — AB (ref 65–99)
POTASSIUM: 3.8 mmol/L (ref 3.5–5.1)
SODIUM: 140 mmol/L (ref 135–145)
TOTAL PROTEIN: 6.7 g/dL (ref 6.5–8.1)

## 2016-10-22 LAB — ACETAMINOPHEN LEVEL: ACETAMINOPHEN (TYLENOL), SERUM: 11 ug/mL (ref 10–30)

## 2016-10-22 LAB — LITHIUM LEVEL: Lithium Lvl: 0.18 mmol/L — ABNORMAL LOW (ref 0.60–1.20)

## 2016-10-22 LAB — ETHANOL: Alcohol, Ethyl (B): <5 mg/dL (ref <5)

## 2016-10-22 MED ORDER — SODIUM CHLORIDE 0.9 % IV BOLUS (SEPSIS)
1000.0000 mL | Freq: Once | INTRAVENOUS | Status: AC
  Administered 2016-10-22: 1000 mL via INTRAVENOUS

## 2016-10-22 NOTE — ED Notes (Signed)
[facility]AR at bedside to transport patient to facility.

## 2016-10-22 NOTE — ED Notes (Signed)
Patient transported to CT 

## 2016-10-22 NOTE — ED Notes (Signed)
ED Provider at bedside. 

## 2016-10-22 NOTE — Discharge Instructions (Signed)
As discussed, it is important that you follow up as soon as possible with your physician for continued management of your condition. ° °If you develop any new, or concerning changes in your condition, please return to the emergency department immediately. ° °

## 2016-10-22 NOTE — ED Provider Notes (Signed)
[facility] DEPT Provider Note   CSN: [id number] Arrival date & time: 10/22/16  1013     History   Chief Complaint Chief Complaint  Patient presents with  . Drug Overdose    HPI Jenna Powell is a 66 y.o. female.  HPI  Patient presents from her nursing facility after being found unresponsive. Per reports patient has multiple medical issues including dementia, psychiatric disease. Today, after breakfast patient was found slumped over, with no responsiveness. EMS reports that with Narcan patient had transient improvement, though nothing sustained. Level V caveat secondary to unresponsiveness.   Past Medical History:  Diagnosis Date  . Dementia   . Hyperlipidemia   . Hypertension     There are no active problems to display for this patient.   History reviewed. No pertinent surgical history.  OB History    Gravida Para Term Preterm AB Living   7         7   SAB TAB Ectopic Multiple Live Births                   Home Medications    Prior to Admission medications   Medication Sig Start Date End Date Taking? Authorizing Provider  aspirin 81 MG EC tablet Take 81 mg by mouth daily. Swallow whole.    Historical Provider, MD  benztropine (COGENTIN) 1 MG tablet Take 1 mg by mouth 2 (two) times daily.    Historical Provider, MD  Calcium Carbonate-Vitamin D (CALCIUM 600+D) 600-400 MG-UNIT tablet Take 1 tablet by mouth every 12 (twelve) hours.    Historical Provider, MD  Calcium Polycarbophil (FIBER LAXATIVE PO) Take 1 tablet by mouth 2 (two) times daily.    Historical Provider, MD  cloZAPine (CLOZARIL) 100 MG tablet Take 100-200 mg by mouth 2 (two) times daily. Takes 1 tab in am and  2 tabs in pm    Historical Provider, MD  docusate (COLACE) 50 MG/5ML liquid Take 100 mg by mouth 3 (three) times daily.     Historical Provider, MD  donepezil (ARICEPT) 10 MG tablet Take 10 mg by mouth at bedtime.    Historical Provider, MD  lithium carbonate (LITHOBID) 300 MG CR tablet  Take 600 mg by mouth at bedtime.    Historical Provider, MD  metFORMIN (GLUCOPHAGE) 1000 MG tablet Take 1,000 mg by mouth at bedtime.    Historical Provider, MD  metoprolol tartrate (LOPRESSOR) 25 MG tablet Take 12.5 mg by mouth 2 (two) times daily.    Historical Provider, MD  mirabegron ER (MYRBETRIQ) 50 MG TB24 tablet Take 50 mg by mouth daily.    Historical Provider, MD  pravastatin (PRAVACHOL) 20 MG tablet Take 20 mg by mouth every evening.     Historical Provider, MD  ramipril (ALTACE) 2.5 MG capsule Take 2.5 mg by mouth daily.    Historical Provider, MD  risperidone (RISPERDAL) 4 MG tablet Take 4 mg by mouth 2 (two) times daily.     Historical Provider, MD  sertraline (ZOLOFT) 50 MG tablet Take 50 mg by mouth daily.    Historical Provider, MD  sulfamethoxazole-trimethoprim (BACTRIM DS,SEPTRA DS) 800-160 MG tablet Take 1 tablet by mouth 2 (two) times daily. 08/21/15   Seabron Spates, CNM    Family History No family history on file.  Social History Social History  Substance Use Topics  . Smoking status: Never Smoker  . Smokeless tobacco: Not on file  . Alcohol use No     Allergies   Patient has no  known allergies.   Review of Systems Review of Systems  Unable to perform ROS: Patient unresponsive     Physical Exam Updated Vital Signs BP (!) 118/59 (BP Location: Left Arm)   Pulse 83   Temp 98 F (36.7 C) (Oral)   Resp 20   SpO2 94%   Physical Exam  Constitutional: She appears well-developed and well-nourished.  Unresponsive, obese F, responds to painful stimuli.  HENT:  Head: Normocephalic and atraumatic.  Eyes: Conjunctivae and EOM are normal.  Cardiovascular: Normal rate and regular rhythm.   Pulmonary/Chest: Effort normal and breath sounds normal. No stridor. No respiratory distress.  Abdominal: She exhibits no distension.  Musculoskeletal: She exhibits no edema.  Neurological: She is unresponsive.  MAES w painful stimuli, responds briefly, falls asleep  again quickly.  Skin: Skin is warm and dry.  Psychiatric: Cognition and memory are impaired.  unresponsive  Nursing note and vitals reviewed.    ED Treatments / Results  Labs (all labs ordered are listed, but only abnormal results are displayed) Labs Reviewed  ACETAMINOPHEN LEVEL  CBC  COMPREHENSIVE METABOLIC PANEL  ETHANOL  URINE DRUGS OF ABUSE SCREEN W ALC, ROUTINE (REF LAB)  CK TOTAL AND CKMB (NOT AT [facility])  SALICYLATE LEVEL  URINALYSIS, ROUTINE W REFLEX MICROSCOPIC  LITHIUM LEVEL    EKG  EKG Interpretation  Date/Time:  Wednesday October 22 2016 10:30:34 EDT Ventricular Rate:  80 PR Interval:    QRS Duration: 85 QT Interval:  382 QTC Calculation: 441 R Axis:   26 Text Interpretation:  Sinus rhythm Low voltage, precordial leads Abnormal R-wave progression, early transition Borderline T abnormalities, anterior leads Abnormal ekg Confirmed by Carmin Muskrat  MD [phone]) on 10/22/2016 10:52:38 AM Also confirmed by Carmin Muskrat  MD ([id number]), editor Sherral Hammers, Mel Almond [phone])  on 10/22/2016 10:59:20 AM       Radiology Dg Chest Port 1 View  Result Date: 10/22/2016 CLINICAL DATA:  Altered mental status/unresponsive EXAM: PORTABLE CHEST 1 VIEW COMPARISON:  None. FINDINGS: There is no edema or consolidation. Heart size and pulmonary vascularity are normal. No adenopathy. No pneumothorax. There is evidence of eventration along the left hemidiaphragm. IMPRESSION: No edema or consolidation. Electronically Signed   By: Lowella Grip III M.D.   On: 10/22/2016 10:45    Procedures Procedures (including critical care time)  Medications Ordered in ED Medications  sodium chloride 0.9 % bolus 1,000 mL (1,000 mLs Intravenous New Bag/Given 10/22/16 1046)     Initial Impression / Assessment and Plan / ED Course  I have reviewed the triage vital signs and the nursing notes.  Pertinent labs & imaging results that were available during my care of the patient were reviewed by me and  considered in my medical decision making (see chart for details).  2:48 PM Patient awakens easily, speaks briefly, but clearly, denies pain, states that she is ready to go back to her nursing facility. Review of labs, findings, x-ray, CT all reassuring. Given the patient's baseline cognitive status, there is some suspicion for iatrogenic cause, possible medication mixup, though this is not able to be substantiated. With otherwise reassuring findings, no evidence for stroke, emergent, substantial metabolic abnormalities, no evidence for infection, patient will return to her nursing facility.   Final Clinical Impressions(s) / ED Diagnoses  Altered mental status   Carmin Muskrat, MD 10/22/16 [phone]

## 2016-10-22 NOTE — ED Triage Notes (Addendum)
Per EMS-states she was found unresponsive by RN at nursing home-states she might have been given too many of her pain meds-pinpoint pupils upon EMS arriving-given Narcan 2 mg given intranasal and 2 mg given IV-minimal response-still remains unresponsive, swallow breathing-respond to pain, minimal verbal response-nursing facility states she was "normal" prior to going to breakfast and during her am med pass-CBG 259

## 2016-10-22 NOTE — ED Notes (Signed)
Bed: RESB Expected date:  Expected time:  Means of arrival:  Comments: EMS-unresponsive/narcan

## 2016-10-23 LAB — URINE DRUGS OF ABUSE SCREEN W ALC, ROUTINE (REF LAB)
AMPHETAMINES, URINE: NEGATIVE ng/mL
BARBITURATE, UR: NEGATIVE ng/mL
Benzodiazepine Quant, Ur: NEGATIVE ng/mL
CANNABINOID QUANT UR: NEGATIVE ng/mL
COCAINE (METAB.): NEGATIVE ng/mL
ETHANOL U, QUAN: NEGATIVE %
METHADONE SCREEN, URINE: NEGATIVE ng/mL
Opiate Quant, Ur: NEGATIVE ng/mL
Phencyclidine, Ur: NEGATIVE ng/mL
Propoxyphene, Urine: NEGATIVE ng/mL

## 2017-02-23 ENCOUNTER — Ambulatory Visit (INDEPENDENT_AMBULATORY_CARE_PROVIDER_SITE_OTHER): Payer: Medicare Other | Admitting: Podiatry

## 2017-02-23 DIAGNOSIS — B351 Tinea unguium: Secondary | ICD-10-CM | POA: Diagnosis not present

## 2017-02-23 DIAGNOSIS — E0842 Diabetes mellitus due to underlying condition with diabetic polyneuropathy: Secondary | ICD-10-CM

## 2017-02-23 DIAGNOSIS — M79676 Pain in unspecified toe(s): Secondary | ICD-10-CM

## 2017-02-23 NOTE — Progress Notes (Signed)
   SUBJECTIVE Patient with a history of diabetes mellitus presents to office today complaining of elongated, thickened nails. Pain while ambulating in shoes. Patient is unable to trim their own nails.   OBJECTIVE General Patient is awake, alert, and oriented x 3 and in no acute distress. Derm Skin is dry and supple bilateral. Negative open lesions or macerations. Remaining integument unremarkable. Nails are tender, long, thickened and dystrophic with subungual debris, consistent with onychomycosis, 1-5 bilateral. No signs of infection noted. Vasc  DP and PT pedal pulses palpable bilaterally. Temperature gradient within normal limits.  Neuro Epicritic and protective threshold sensation diminished bilaterally.  Musculoskeletal Exam No symptomatic pedal deformities noted bilateral. Muscular strength within normal limits.  ASSESSMENT 1. Diabetes Mellitus w/ peripheral neuropathy 2. Onychomycosis of nail due to dermatophyte bilateral 3. Pain in foot bilateral  PLAN OF CARE 1. Patient evaluated today. 2. Instructed to maintain good pedal hygiene and foot care. Stressed importance of controlling blood sugar.  3. Mechanical debridement of nails 1-5 bilaterally performed using a nail nipper. Filed with dremel without incident.  4. Return to clinic in 3 mos.     Brent M. Evans, DPM [facility]  Dr. Brent M. Evans, DPM    [address]                                        [place], [place] [address]                Office [phone]  Fax [phone]       

## 2017-05-25 ENCOUNTER — Ambulatory Visit: Payer: Medicare Other | Admitting: Podiatry

## 2017-06-21 ENCOUNTER — Encounter (HOSPITAL_COMMUNITY): Payer: Self-pay

## 2017-06-21 ENCOUNTER — Emergency Department (HOSPITAL_COMMUNITY)
Admission: EM | Admit: 2017-06-21 | Discharge: 2017-06-21 | Disposition: A | Discharge status: Home / Self Care | Payer: Medicare Other | Attending: Emergency Medicine | Admitting: Emergency Medicine

## 2017-06-21 ENCOUNTER — Emergency Department (HOSPITAL_COMMUNITY): Payer: Medicare Other

## 2017-06-21 ENCOUNTER — Other Ambulatory Visit: Payer: Self-pay

## 2017-06-21 DIAGNOSIS — F0390 Unspecified dementia without behavioral disturbance: Secondary | ICD-10-CM | POA: Diagnosis not present

## 2017-06-21 DIAGNOSIS — R509 Fever, unspecified: Secondary | ICD-10-CM | POA: Diagnosis present

## 2017-06-21 DIAGNOSIS — R531 Weakness: Secondary | ICD-10-CM

## 2017-06-21 DIAGNOSIS — I10 Essential (primary) hypertension: Secondary | ICD-10-CM | POA: Diagnosis not present

## 2017-06-21 DIAGNOSIS — Z7982 Long term (current) use of aspirin: Secondary | ICD-10-CM | POA: Diagnosis not present

## 2017-06-21 DIAGNOSIS — Z79899 Other long term (current) drug therapy: Secondary | ICD-10-CM | POA: Diagnosis not present

## 2017-06-21 LAB — COMPREHENSIVE METABOLIC PANEL
ALBUMIN: 4 g/dL (ref 3.5–5.0)
ALT: 16 U/L (ref 14–54)
AST: 22 U/L (ref 15–41)
Alkaline Phosphatase: 114 U/L (ref 38–126)
Anion gap: 8 (ref 5–15)
BUN: 13 mg/dL (ref 6–20)
CHLORIDE: 108 mmol/L (ref 101–111)
CO2: 25 mmol/L (ref 22–32)
CREATININE: 0.98 mg/dL (ref 0.44–1.00)
Calcium: 9.7 mg/dL (ref 8.9–10.3)
GFR calc non Af Amer: 59 mL/min — ABNORMAL LOW (ref >60)
GLUCOSE: 80 mg/dL (ref 65–99)
Potassium: 3.9 mmol/L (ref 3.5–5.1)
SODIUM: 141 mmol/L (ref 135–145)
Total Bilirubin: 0.6 mg/dL (ref 0.3–1.2)
Total Protein: 6.5 g/dL (ref 6.5–8.1)

## 2017-06-21 LAB — I-STAT CG4 LACTIC ACID, ED: LACTIC ACID, VENOUS: 0.78 mmol/L (ref 0.5–1.9)

## 2017-06-21 LAB — CBC WITH DIFFERENTIAL/PLATELET
Basophils Absolute: 0 10*3/uL (ref 0.0–0.1)
Basophils Relative: 0 %
Eosinophils Absolute: 0 10*3/uL (ref 0.0–0.7)
Eosinophils Relative: 0 %
HEMATOCRIT: 38.3 % (ref 36.0–46.0)
HEMOGLOBIN: 12 g/dL (ref 12.0–15.0)
LYMPHS ABS: 1.3 10*3/uL (ref 0.7–4.0)
Lymphocytes Relative: 25 %
MCH: 27.3 pg (ref 26.0–34.0)
MCHC: 31.3 g/dL (ref 30.0–36.0)
MCV: 87.2 fL (ref 78.0–100.0)
MONOS PCT: 5 %
Monocytes Absolute: 0.3 10*3/uL (ref 0.1–1.0)
NEUTROS ABS: 3.6 10*3/uL (ref 1.7–7.7)
NEUTROS PCT: 70 %
Platelets: 208 10*3/uL (ref 150–400)
RBC: 4.39 MIL/uL (ref 3.87–5.11)
RDW: 13.1 % (ref 11.5–15.5)
WBC: 5.2 10*3/uL (ref 4.0–10.5)

## 2017-06-21 LAB — URINALYSIS, ROUTINE W REFLEX MICROSCOPIC
BILIRUBIN URINE: NEGATIVE
Glucose, UA: NEGATIVE mg/dL
HGB URINE DIPSTICK: NEGATIVE
Ketones, ur: 20 mg/dL — AB
Leukocytes, UA: NEGATIVE
Nitrite: NEGATIVE
PH: 5 (ref 5.0–8.0)
Protein, ur: NEGATIVE mg/dL
SPECIFIC GRAVITY, URINE: 1.021 (ref 1.005–1.030)

## 2017-06-21 LAB — LITHIUM LEVEL: Lithium Lvl: 0.25 mmol/L — ABNORMAL LOW (ref 0.60–1.20)

## 2017-06-21 NOTE — ED Notes (Signed)
Pt unable to sign due to advanced dementia

## 2017-06-21 NOTE — ED Notes (Signed)
Patient transported to X-ray 

## 2017-06-21 NOTE — Discharge Instructions (Signed)
It was our pleasure to provide your ER care today - we hope that you feel better.  Your lab tests look good/normal.  Follow up with your primary care doctor in the next few days.  Return to ER if worse, new symptoms, trouble breathing, other concern.

## 2017-06-21 NOTE — ED Provider Notes (Signed)
[place] EMERGENCY DEPARTMENT Provider Note   CSN: [id number] Arrival date & time: 06/21/17  1121     History   Chief Complaint Chief Complaint  Patient presents with  . Fever  . Weakness    HPI Jenna Powell is a 66 y.o. female.  Patient w hx dementia, from ECF with report of fever.  Pt is difficult historian/confused/dementia - level 5 caveat.  Pt is awake, alert, smiling, denies specific complaints.   No report of trauma/injury. Pt denies headache. Denies cough or uri symptoms. Denies abd pain. Denies urinary symptoms.    The history is provided by the patient and the EMS personnel. The history is limited by the condition of the patient.  Fever   Pertinent negatives include no chest pain, no headaches and no cough.  Weakness  Pertinent negatives include no chest pain and no headaches.    Past Medical History:  Diagnosis Date  . Dementia   . Hyperlipidemia   . Hypertension     There are no active problems to display for this patient.   History reviewed. No pertinent surgical history.  OB History    Gravida Para Term Preterm AB Living   7         7   SAB TAB Ectopic Multiple Live Births                   Home Medications    Prior to Admission medications   Medication Sig Start Date End Date Taking? Authorizing Provider  aspirin 81 MG EC tablet Take 81 mg by mouth daily. Swallow whole.    [provider]  benztropine (COGENTIN) 1 MG tablet Take 1 mg by mouth 2 (two) times daily.    [provider]  Calcium Carbonate-Vitamin D (CALCIUM 600+D) 600-400 MG-UNIT tablet Take 1 tablet by mouth every 12 (twelve) hours.    [provider]  Calcium Polycarbophil (FIBER LAXATIVE PO) Take 1 tablet by mouth 2 (two) times daily.    [provider]  cloZAPine (CLOZARIL) 100 MG tablet Take 100-200 mg by mouth 2 (two) times daily. Takes 1 tab in am and  2 tabs in pm    [provider]  docusate (COLACE) 50  MG/5ML liquid Take 100 mg by mouth 3 (three) times daily.     [provider]  donepezil (ARICEPT) 10 MG tablet Take 10 mg by mouth at bedtime.    [provider]  lithium carbonate (LITHOBID) 300 MG CR tablet Take 600 mg by mouth at bedtime.    [provider]  mirabegron ER (MYRBETRIQ) 50 MG TB24 tablet Take 50 mg by mouth daily.    [provider]  pravastatin (PRAVACHOL) 20 MG tablet Take 20 mg by mouth every evening.     [provider]  ramipril (ALTACE) 2.5 MG capsule Take 2.5 mg by mouth daily.    [provider]  risperidone (RISPERDAL) 4 MG tablet Take 4 mg by mouth 2 (two) times daily.     [provider]  sertraline (ZOLOFT) 50 MG tablet Take 50 mg by mouth daily.    [provider]  traZODone (DESYREL) 100 MG tablet Take 100 mg by mouth at bedtime.    [provider]  traZODone (DESYREL) 50 MG tablet Take 50 mg by mouth at bedtime.    [provider]    Family History History reviewed. No pertinent family history.  Social History Social History   Tobacco Use  .  Smoking status: Never Smoker  . Smokeless tobacco: Never Used  Substance Use Topics  . Alcohol use: No  . Drug use: Not on file     Allergies   Patient has no known allergies.   Review of Systems Review of Systems  Unable to perform ROS: Dementia  Constitutional: Positive for fever.  Respiratory: Negative for cough.   Cardiovascular: Negative for chest pain.  Gastrointestinal: Negative for abdominal pain.  Musculoskeletal: Negative for neck pain and neck stiffness.  Skin: Negative for rash and wound.  Neurological: Positive for weakness. Negative for headaches.  level 5 caveat - dementia   Physical Exam Updated Vital Signs BP 132/85   Pulse 86   Temp 98.5 F (36.9 C) (Oral)   Resp (!) 22   SpO2 98%   Physical Exam  Constitutional: She appears well-developed and well-nourished. No distress.  HENT:  Head:  Atraumatic.  Nose: Nose normal.  Mouth/Throat: Oropharynx is clear and moist.  Eyes: Conjunctivae and EOM are normal. Pupils are equal, round, and reactive to light. No scleral icterus.  Neck: Neck supple. No tracheal deviation present.  No stiffness or rigidity.  No bruit.   Cardiovascular: Normal rate, regular rhythm, normal heart sounds and intact distal pulses. Exam reveals no gallop and no friction rub.  No murmur heard. Pulmonary/Chest: Effort normal and breath sounds normal. No respiratory distress.  Abdominal: Soft. Normal appearance and bowel sounds are normal. She exhibits no distension. There is no tenderness. There is no guarding.  Genitourinary:  Genitourinary Comments: No cva tenderness  Musculoskeletal: She exhibits no edema or tenderness.  Neurological: She is alert.  Alert, speech clear/fluent. No facial droop. Motor intact bil, stre 5/5 bil. No pronator drift. sens intact bil.     Skin: Skin is warm and dry. No rash noted. She is not diaphoretic.  Psychiatric: She has a normal mood and affect.  Nursing note and vitals reviewed.    ED Treatments / Results  Labs (all labs ordered are listed, but only abnormal results are displayed) Results for orders placed or performed during the hospital encounter of 06/21/17  Comprehensive metabolic panel  Result Value Ref Range   Sodium 141 135 - 145 mmol/L   Potassium 3.9 3.5 - 5.1 mmol/L   Chloride 108 101 - 111 mmol/L   CO2 25 22 - 32 mmol/L   Glucose, Bld 80 65 - 99 mg/dL   BUN 13 6 - 20 mg/dL   Creatinine, Ser 0.98 0.44 - 1.00 mg/dL   Calcium 9.7 8.9 - 10.3 mg/dL   Total Protein 6.5 6.5 - 8.1 g/dL   Albumin 4.0 3.5 - 5.0 g/dL   AST 22 15 - 41 U/L   ALT 16 14 - 54 U/L   Alkaline Phosphatase 114 38 - 126 U/L   Total Bilirubin 0.6 0.3 - 1.2 mg/dL   GFR calc non Af Amer 59 (L) >60 mL/min   GFR calc Af Amer >60 >60 mL/min   Anion gap 8 5 - 15  CBC WITH DIFFERENTIAL  Result Value Ref Range   WBC 5.2 4.0 - 10.5 K/uL     RBC 4.39 3.87 - 5.11 MIL/uL   Hemoglobin 12.0 12.0 - 15.0 g/dL   HCT 38.3 36.0 - 46.0 %   MCV 87.2 78.0 - 100.0 fL   MCH 27.3 26.0 - 34.0 pg   MCHC 31.3 30.0 - 36.0 g/dL   RDW 13.1 11.5 - 15.5 %   Platelets 208 150 - 400 K/uL  Neutrophils Relative % 70 %   Neutro Abs 3.6 1.7 - 7.7 K/uL   Lymphocytes Relative 25 %   Lymphs Abs 1.3 0.7 - 4.0 K/uL   Monocytes Relative 5 %   Monocytes Absolute 0.3 0.1 - 1.0 K/uL   Eosinophils Relative 0 %   Eosinophils Absolute 0.0 0.0 - 0.7 K/uL   Basophils Relative 0 %   Basophils Absolute 0.0 0.0 - 0.1 K/uL  Urinalysis, Routine w reflex microscopic  Result Value Ref Range   Color, Urine YELLOW YELLOW   APPearance CLEAR CLEAR   Specific Gravity, Urine 1.021 1.005 - 1.030   pH 5.0 5.0 - 8.0   Glucose, UA NEGATIVE NEGATIVE mg/dL   Hgb urine dipstick NEGATIVE NEGATIVE   Bilirubin Urine NEGATIVE NEGATIVE   Ketones, ur 20 (A) NEGATIVE mg/dL   Protein, ur NEGATIVE NEGATIVE mg/dL   Nitrite NEGATIVE NEGATIVE   Leukocytes, UA NEGATIVE NEGATIVE  Lithium level  Result Value Ref Range   Lithium Lvl 0.25 (L) 0.60 - 1.20 mmol/L  I-Stat CG4 Lactic Acid, ED  (not at  [facility])  Result Value Ref Range   Lactic Acid, Venous 0.78 0.5 - 1.9 mmol/L   Dg Chest 2 View  Result Date: 06/21/2017 CLINICAL DATA:  Fever EXAM: CHEST  2 VIEW COMPARISON:  10/22/2016 FINDINGS: Eventration of the left hemidiaphragm. Bibasilar atelectasis and mild vascular congestion. No overt edema. Heart is upper limits normal in size. No effusions. IMPRESSION: Mild vascular congestion.  Bibasilar atelectasis. Electronically Signed   By: Rolm Baptise M.D.   On: 06/21/2017 13:24    EKG  EKG Interpretation None       Radiology Dg Chest 2 View  Result Date: 06/21/2017 CLINICAL DATA:  Fever EXAM: CHEST  2 VIEW COMPARISON:  10/22/2016 FINDINGS: Eventration of the left hemidiaphragm. Bibasilar atelectasis and mild vascular congestion. No overt edema. Heart is upper limits normal in  size. No effusions. IMPRESSION: Mild vascular congestion.  Bibasilar atelectasis. Electronically Signed   By: Rolm Baptise M.D.   On: 06/21/2017 13:24    Procedures Procedures (including critical care time)  Medications Ordered in ED Medications - No data to display   Initial Impression / Assessment and Plan / ED Course  I have reviewed the triage vital signs and the nursing notes.  Pertinent labs & imaging results that were available during my care of the patient were reviewed by me and considered in my medical decision making (see chart for details).  Iv ns. Labs sent. Cultures.  Reviewed nursing notes and prior charts for additional history.   Chest xray.   Labs and xrays unremarkable.   No fever in ED. No focal weakness on exam.   Patient denies current pain/symptoms.  Patient appears stable for d/c.     Final Clinical Impressions(s) / ED Diagnoses   Final diagnoses:  None    ED Discharge Orders    None       Lajean Saver, MD 06/21/17 1334

## 2017-06-21 NOTE — ED Notes (Signed)
Pt going back and forth between speaking french, [place] and [place]

## 2017-06-21 NOTE — ED Triage Notes (Signed)
To room via EMS.  [facility] called EMS for stroke right side lean.  Time unknown.  Negative stroke screen by EMS.  Pt has advanced dementia and switches from [place] to [place] mid sentence.  Pt is alert to person, DOB  - except year, place.  Facility reported pt has had fever, unknown time.

## 2017-06-22 LAB — URINE CULTURE: CULTURE: NO GROWTH

## 2017-06-24 ENCOUNTER — Encounter (HOSPITAL_COMMUNITY): Payer: Self-pay | Admitting: Emergency Medicine

## 2017-06-24 ENCOUNTER — Emergency Department (HOSPITAL_COMMUNITY)
Admission: EM | Admit: 2017-06-24 | Discharge: 2017-06-25 | Disposition: A | Discharge status: Skilled Nursing Facility | Payer: Medicare Other | Attending: Emergency Medicine | Admitting: Emergency Medicine

## 2017-06-24 DIAGNOSIS — F0391 Unspecified dementia with behavioral disturbance: Secondary | ICD-10-CM | POA: Insufficient documentation

## 2017-06-24 DIAGNOSIS — Z79899 Other long term (current) drug therapy: Secondary | ICD-10-CM | POA: Diagnosis not present

## 2017-06-24 DIAGNOSIS — E119 Type 2 diabetes mellitus without complications: Secondary | ICD-10-CM | POA: Insufficient documentation

## 2017-06-24 DIAGNOSIS — G4751 Confusional arousals: Secondary | ICD-10-CM | POA: Diagnosis present

## 2017-06-24 DIAGNOSIS — Z7982 Long term (current) use of aspirin: Secondary | ICD-10-CM | POA: Insufficient documentation

## 2017-06-24 DIAGNOSIS — I10 Essential (primary) hypertension: Secondary | ICD-10-CM | POA: Insufficient documentation

## 2017-06-24 HISTORY — DX: Type 2 diabetes mellitus without complications: E11.9

## 2017-06-24 LAB — CBC WITH DIFFERENTIAL/PLATELET
BASOS ABS: 0 10*3/uL (ref 0.0–0.1)
Basophils Relative: 0 %
Eosinophils Absolute: 0 10*3/uL (ref 0.0–0.7)
Eosinophils Relative: 0 %
HEMATOCRIT: 37 % (ref 36.0–46.0)
HEMOGLOBIN: 11.7 g/dL — AB (ref 12.0–15.0)
LYMPHS PCT: 29 %
Lymphs Abs: 1.5 10*3/uL (ref 0.7–4.0)
MCH: 27.6 pg (ref 26.0–34.0)
MCHC: 31.6 g/dL (ref 30.0–36.0)
MCV: 87.3 fL (ref 78.0–100.0)
MONO ABS: 0.2 10*3/uL (ref 0.1–1.0)
Monocytes Relative: 4 %
NEUTROS ABS: 3.5 10*3/uL (ref 1.7–7.7)
Neutrophils Relative %: 67 %
Platelets: 196 10*3/uL (ref 150–400)
RBC: 4.24 MIL/uL (ref 3.87–5.11)
RDW: 13.2 % (ref 11.5–15.5)
WBC: 5.3 10*3/uL (ref 4.0–10.5)

## 2017-06-24 LAB — URINALYSIS, ROUTINE W REFLEX MICROSCOPIC
Bilirubin Urine: NEGATIVE
GLUCOSE, UA: NEGATIVE mg/dL
Hgb urine dipstick: NEGATIVE
KETONES UR: 20 mg/dL — AB
LEUKOCYTES UA: NEGATIVE
Nitrite: NEGATIVE
Protein, ur: NEGATIVE mg/dL
Specific Gravity, Urine: 1.02 (ref 1.005–1.030)
pH: 5 (ref 5.0–8.0)

## 2017-06-24 LAB — COMPREHENSIVE METABOLIC PANEL
ALBUMIN: 3.9 g/dL (ref 3.5–5.0)
ALT: 24 U/L (ref 14–54)
ANION GAP: 7 (ref 5–15)
AST: 37 U/L (ref 15–41)
Alkaline Phosphatase: 114 U/L (ref 38–126)
BILIRUBIN TOTAL: 0.8 mg/dL (ref 0.3–1.2)
BUN: 15 mg/dL (ref 6–20)
CO2: 26 mmol/L (ref 22–32)
Calcium: 9.3 mg/dL (ref 8.9–10.3)
Chloride: 106 mmol/L (ref 101–111)
Creatinine, Ser: 0.94 mg/dL (ref 0.44–1.00)
GFR calc Af Amer: >60 mL/min (ref >60)
GFR calc non Af Amer: >60 mL/min (ref >60)
GLUCOSE: 91 mg/dL (ref 65–99)
POTASSIUM: 3.5 mmol/L (ref 3.5–5.1)
SODIUM: 139 mmol/L (ref 135–145)
TOTAL PROTEIN: 6.8 g/dL (ref 6.5–8.1)

## 2017-06-24 LAB — I-STAT CG4 LACTIC ACID, ED: Lactic Acid, Venous: 0.81 mmol/L (ref 0.5–1.9)

## 2017-06-24 LAB — AMMONIA: Ammonia: 23 umol/L (ref 9–35)

## 2017-06-24 LAB — I-STAT TROPONIN, ED: Troponin i, poc: 0 ng/mL (ref 0.00–0.08)

## 2017-06-24 MED ORDER — OLANZAPINE 2.5 MG PO TABS: 2.5000 mg | ORAL_TABLET | Freq: Every day | ORAL | Status: DC

## 2017-06-24 NOTE — ED Triage Notes (Signed)
EMS called by facility due inability to draw blood on this resident. Staff reports increased weakness and confusion. Pt ate full dinner this evening.. Skin warm to touch. Pt is alert, oriented as "normal for this resident". Pt speaks [place], an some [place]. May need interpreter. Stated that patient has no specific complaints.

## 2017-06-24 NOTE — ED Notes (Signed)
This RN contacted [facility] where patient resides. Staff reports unsure of which blood work patient was sent for except for an ammonia. Staff unable to give more information on patient and situation at present time.

## 2017-06-24 NOTE — ED Provider Notes (Signed)
[place] DEPT Provider Note   CSN: [id number] Arrival date & time: 06/24/17  1737     History   Chief Complaint No chief complaint on file.   HPI Jenna Powell is a 66 y.o. female.  HPI   Staff reports that she has been calm, sweet, polite at baseline  Over the last 3 weeks she has been changing, acting differently  Now she has been throwing everything out of her drawers, is acting, is more agitated, tearing up her room, has been mumbling.  EMS had reported that staff reported she had been confused, but was alert oriented "as normal for this resident", ate all of her dinner this evening.    No known fevers, no cough, no history of trauma.   Level V caveat dementia  Patient denies pain when asked.  Switching between speaking english and [place], requires reminder to repeat in [place].   No known recent medication changes.     Past Medical History:  Diagnosis Date  . Dementia   . Diabetes mellitus without complication ([place])   . Hyperlipidemia   . Hypertension     There are no active problems to display for this patient.   History reviewed. No pertinent surgical history.  OB History    Gravida Para Term Preterm AB Living   7         7   SAB TAB Ectopic Multiple Live Births                   Home Medications    Prior to Admission medications   Medication Sig Start Date End Date Taking? Authorizing Provider  aspirin 81 MG EC tablet Take 81 mg by mouth daily. Swallow whole.   Yes [provider]  benztropine (COGENTIN) 1 MG tablet Take 1 mg 3 (three) times daily by mouth.    Yes [provider]  Calcium Carbonate-Vitamin D (CALCIUM 600+D) 600-400 MG-UNIT tablet Take 1 tablet by mouth every 12 (twelve) hours.   Yes [provider]  Calcium Polycarbophil (FIBER LAXATIVE PO) Take 1 tablet by mouth 2 (two) times daily.   Yes [provider]  clozapine (CLOZARIL) 200 MG tablet Take 200 mg 2 (two)  times daily by mouth. Take 1 tablet (200 mg) in the morning and Take 1 tablet (200mg ) with 25 mg in the evening (total evening dose = 225 mg)   Yes [provider]  cloZAPine (CLOZARIL) 25 MG tablet Take 25 mg every evening by mouth. Take with 200 mg for evening dose. (Total evening dose = 225 mg)   Yes [provider]  Docusate Sodium 100 MG capsule Take 100 mg by mouth 3 (three) times daily.    Yes [provider]  donepezil (ARICEPT) 10 MG tablet Take 10 mg 2 (two) times daily by mouth.    Yes [provider]  lithium carbonate (LITHOBID) 300 MG CR tablet Take 300 mg at bedtime by mouth.    Yes [provider]  mirabegron ER (MYRBETRIQ) 50 MG TB24 tablet Take 50 mg by mouth daily.   Yes [provider]  polycarbophil (FIBERCON) 625 MG tablet Take 625 mg 2 (two) times daily by mouth.   Yes [provider]  Polyvinyl Alcohol (LIQUID TEARS OP) Place 1 drop 3 (three) times daily into both eyes.   Yes [provider]  pravastatin (PRAVACHOL) 20 MG tablet Take 20 mg by mouth every evening.    Yes [provider]  risperidone (RISPERDAL) 4 MG tablet Take 4 mg by mouth 2 (two) times daily.    Yes [provider]  senna (SENOKOT) 8.6 MG TABS tablet Take 2 tablets at bedtime by mouth.   Yes [provider]  sertraline (ZOLOFT) 50 MG tablet Take 50 mg by mouth daily.   Yes [provider]  traZODone (DESYREL) 100 MG tablet Take 100 mg by mouth at bedtime.   Yes [provider]  traZODone (DESYREL) 50 MG tablet Take 25 mg daily by mouth. Taken Daily at 4pm   Yes [provider]    Family History No family history on file.  Social History Social History   Tobacco Use  . Smoking status: Never Smoker  . Smokeless tobacco: Never Used  Substance Use Topics  . Alcohol use: No  . Drug use: Not on file     Allergies   Patient has no known allergies.   Review of  Systems Review of Systems  Unable to perform ROS: Dementia  Constitutional: Negative for fever.  HENT: Negative for sore throat.   Eyes: Negative for visual disturbance.  Respiratory: Negative for cough and shortness of breath.   Cardiovascular: Negative for chest pain.  Gastrointestinal: Negative for abdominal pain, nausea and vomiting.  Genitourinary: Negative for difficulty urinating.  Musculoskeletal: Negative for back pain.  Skin: Negative for rash.  Neurological: Negative for syncope and headaches.     Physical Exam Updated Vital Signs BP 116/80   Pulse 97   Temp 98.2 F (36.8 C) (Oral)   Resp 13   SpO2 100%   Physical Exam Constitutional: She appears well dull developed and well nourished, no acute distress.  Oriented to self.  Not able to state location or time. Head: Normocephalic and atraumatic Mouth/Throat: midline tongue, moist mucous membranes Eyes: Normal EOM, pupils Neck: Supple Cardiovascular: RRR, intact distal pulses, normal heart sounsd Pulm: Normal effort, normal breath sounds, no rhonchi/rales/wheezes Abd: Soft, NTT, ND Neuro: CN 2-12 tested and intact, normal coordination, 5/5 strength bilaterally, speech clear, will switch languages but no apparent aphasia or dysarthria Skin: warm, dry no rashes Psych: hyperactive  ED Treatments / Results  Labs (all labs ordered are listed, but only abnormal results are displayed) Labs Reviewed  CBC WITH DIFFERENTIAL/PLATELET - Abnormal; Notable for the following components:      Result Value   Hemoglobin 11.7 (*)    All other components within normal limits  URINALYSIS, ROUTINE W REFLEX MICROSCOPIC - Abnormal; Notable for the following components:   Ketones, ur 20 (*)    All other components within normal limits  URINE CULTURE  COMPREHENSIVE METABOLIC PANEL  AMMONIA  I-STAT CG4 LACTIC ACID, ED  I-STAT TROPONIN, ED  I-STAT CG4 LACTIC ACID, ED    EKG  EKG Interpretation  Date/Time:  Wednesday June 24 2017 22:15:16 EST Ventricular Rate:  86 PR Interval:    QRS Duration: 91 QT Interval:  375 QTC Calculation: 449 R Axis:   21 Text Interpretation:  Sinus rhythm Abnormal R-wave progression, early transition Nonspecific T abnrm, anterolateral leads No significant change since last tracing Confirmed by Gareth Morgan [phone]) on 06/24/2017 10:17:58 PM       Radiology No results found.  Procedures Procedures (including critical care time)  Medications Ordered in ED Medications  OLANZapine (ZYPREXA) tablet 2.5 mg (not administered)     Initial Impression / Assessment and Plan / ED Course  I have reviewed the triage vital signs and the nursing notes.  Pertinent labs &  imaging results that were available during my care of the patient were reviewed by me and considered in my medical decision making (see chart for details).     66 year old female with a history of dementia, diabetes, hypertension and hyperlipidemia presents with concern for altered mental status.  History is limited by patient and EMS on arrival.  Discussed with staff member, who reports that she has had 3 weeks of progressive behavioral changes, agitation, tearing apart her room, not acting herself.  There is no history of trauma, she appears atraumatic, have low suspicion for acute bleed.  History is limited, however she does deny headache, and have a low suspicion for acute head bleed.  Her neurologic exam is normal, normal strength, normal coordination, no dysarthria, does not appear to have aphasia but does sometimes switch into [place] from [place] or answer questions inappropriately (discussing lunch, green beans when asking location question.)  Doubt CVA.  No history of medication changes.  Labs show a normal ammonia, no leukocytosis, normal electrolytes and renal function.  Urinalysis shows no sign of UTI.  She does not have history of coughing or signs of respiratory infection by history and exam.  Given  progression of symptoms over the last 3 weeks, suspect this is likely worsening dementia with agitation.  Given dose of olanzapine in ED. Recommend continued monitoring and medication adjustments through PCP, possible outpt MRI and work up.  Patient discharged in stable condition with understanding of reasons to return.   Final Clinical Impressions(s) / ED Diagnoses   Final diagnoses:  Dementia with behavioral disturbance, unspecified dementia type    ED Discharge Orders    None       Gareth Morgan, MD 06/25/17 [phone]

## 2017-06-24 NOTE — ED Triage Notes (Signed)
When contacted [place] interpreter was told patient needs different one than [place].  Patient states [place] interpreter states that she mixing [place], [place] and another language together any cant fully understand patient compliant.   Having very hard time getting any information from patient due to her mixing all her different languages together.

## 2017-06-25 DIAGNOSIS — F0391 Unspecified dementia with behavioral disturbance: Secondary | ICD-10-CM | POA: Diagnosis not present

## 2017-06-25 MED ORDER — OLANZAPINE 2.5 MG PO TABS
2.5000 mg | ORAL_TABLET | Freq: Once | ORAL | Status: AC
  Administered 2017-06-25: 2.5 mg via ORAL
  Filled 2017-06-25: qty 1

## 2017-06-26 LAB — URINE CULTURE: CULTURE: NO GROWTH

## 2017-06-26 LAB — CULTURE, BLOOD (ROUTINE X 2)
CULTURE: NO GROWTH
CULTURE: NO GROWTH
Special Requests: ADEQUATE

## 2017-08-07 ENCOUNTER — Other Ambulatory Visit (HOSPITAL_COMMUNITY)
Admit: 2017-08-07 | Discharge: 2017-08-07 | Disposition: A | Discharge status: Home / Self Care | Payer: Medicare Other | Source: Ambulatory Visit | Attending: Psychiatry | Admitting: Psychiatry

## 2017-08-07 DIAGNOSIS — F0390 Unspecified dementia without behavioral disturbance: Secondary | ICD-10-CM | POA: Insufficient documentation

## 2017-08-07 DIAGNOSIS — F209 Schizophrenia, unspecified: Secondary | ICD-10-CM | POA: Diagnosis present

## 2017-08-07 LAB — CBC WITH DIFFERENTIAL/PLATELET
BASOS ABS: 0 10*3/uL (ref 0.0–0.1)
BASOS PCT: 0 %
Eosinophils Absolute: 0 10*3/uL (ref 0.0–0.7)
Eosinophils Relative: 0 %
HEMATOCRIT: 40.5 % (ref 36.0–46.0)
HEMOGLOBIN: 12.6 g/dL (ref 12.0–15.0)
Lymphocytes Relative: 37 %
Lymphs Abs: 1.7 10*3/uL (ref 0.7–4.0)
MCH: 27.2 pg (ref 26.0–34.0)
MCHC: 31.1 g/dL (ref 30.0–36.0)
MCV: 87.5 fL (ref 78.0–100.0)
MONO ABS: 0.3 10*3/uL (ref 0.1–1.0)
Monocytes Relative: 7 %
NEUTROS ABS: 2.6 10*3/uL (ref 1.7–7.7)
NEUTROS PCT: 56 %
Platelets: 207 10*3/uL (ref 150–400)
RBC: 4.63 MIL/uL (ref 3.87–5.11)
RDW: 12.9 % (ref 11.5–15.5)
WBC: 4.6 10*3/uL (ref 4.0–10.5)

## 2017-08-07 LAB — LITHIUM LEVEL: LITHIUM LVL: 0.26 mmol/L — AB (ref 0.60–1.20)

## 2017-08-11 LAB — MISC LABCORP TEST (SEND OUT): LABCORP TEST CODE: 706440

## 2017-12-30 ENCOUNTER — Emergency Department (HOSPITAL_COMMUNITY): Payer: Medicare Other

## 2017-12-30 ENCOUNTER — Emergency Department (HOSPITAL_COMMUNITY)
Admission: EM | Admit: 2017-12-30 | Discharge: 2017-12-30 | Disposition: A | Discharge status: Home / Self Care | Payer: Medicare Other | Attending: Emergency Medicine | Admitting: Emergency Medicine

## 2017-12-30 ENCOUNTER — Encounter (HOSPITAL_COMMUNITY): Payer: Self-pay

## 2017-12-30 DIAGNOSIS — Z79899 Other long term (current) drug therapy: Secondary | ICD-10-CM | POA: Insufficient documentation

## 2017-12-30 DIAGNOSIS — F0390 Unspecified dementia without behavioral disturbance: Secondary | ICD-10-CM | POA: Diagnosis not present

## 2017-12-30 DIAGNOSIS — R262 Difficulty in walking, not elsewhere classified: Secondary | ICD-10-CM | POA: Diagnosis not present

## 2017-12-30 DIAGNOSIS — R4182 Altered mental status, unspecified: Secondary | ICD-10-CM | POA: Diagnosis present

## 2017-12-30 DIAGNOSIS — E119 Type 2 diabetes mellitus without complications: Secondary | ICD-10-CM | POA: Insufficient documentation

## 2017-12-30 DIAGNOSIS — I10 Essential (primary) hypertension: Secondary | ICD-10-CM | POA: Insufficient documentation

## 2017-12-30 DIAGNOSIS — Z7982 Long term (current) use of aspirin: Secondary | ICD-10-CM | POA: Insufficient documentation

## 2017-12-30 LAB — COMPREHENSIVE METABOLIC PANEL
ALBUMIN: 3.6 g/dL (ref 3.5–5.0)
ALT: 18 U/L (ref 14–54)
ANION GAP: 4 — AB (ref 5–15)
AST: 16 U/L (ref 15–41)
Alkaline Phosphatase: 83 U/L (ref 38–126)
BILIRUBIN TOTAL: 0.6 mg/dL (ref 0.3–1.2)
BUN: 8 mg/dL (ref 6–20)
CO2: 27 mmol/L (ref 22–32)
Calcium: 9.9 mg/dL (ref 8.9–10.3)
Chloride: 111 mmol/L (ref 101–111)
Creatinine, Ser: 0.98 mg/dL (ref 0.44–1.00)
GFR, EST NON AFRICAN AMERICAN: 59 mL/min — AB (ref >60)
Glucose, Bld: 103 mg/dL — ABNORMAL HIGH (ref 65–99)
POTASSIUM: 3.7 mmol/L (ref 3.5–5.1)
Sodium: 142 mmol/L (ref 135–145)
TOTAL PROTEIN: 6.1 g/dL — AB (ref 6.5–8.1)

## 2017-12-30 LAB — CBC WITH DIFFERENTIAL/PLATELET
ABS IMMATURE GRANULOCYTES: 0 10*3/uL (ref 0.0–0.1)
BASOS ABS: 0 10*3/uL (ref 0.0–0.1)
Basophils Relative: 0 %
Eosinophils Absolute: 0 10*3/uL (ref 0.0–0.7)
Eosinophils Relative: 0 %
HCT: 39.4 % (ref 36.0–46.0)
HEMOGLOBIN: 12.5 g/dL (ref 12.0–15.0)
Immature Granulocytes: 0 %
LYMPHS PCT: 37 %
Lymphs Abs: 1.7 10*3/uL (ref 0.7–4.0)
MCH: 27.6 pg (ref 26.0–34.0)
MCHC: 31.7 g/dL (ref 30.0–36.0)
MCV: 87 fL (ref 78.0–100.0)
Monocytes Absolute: 0.4 10*3/uL (ref 0.1–1.0)
Monocytes Relative: 8 %
NEUTROS ABS: 2.5 10*3/uL (ref 1.7–7.7)
NEUTROS PCT: 55 %
PLATELETS: 203 10*3/uL (ref 150–400)
RBC: 4.53 MIL/uL (ref 3.87–5.11)
RDW: 12.4 % (ref 11.5–15.5)
WBC: 4.6 10*3/uL (ref 4.0–10.5)

## 2017-12-30 LAB — LITHIUM LEVEL: LITHIUM LVL: 0.36 mmol/L — AB (ref 0.60–1.20)

## 2017-12-30 LAB — AMMONIA: Ammonia: 45 umol/L — ABNORMAL HIGH (ref 9–35)

## 2017-12-30 LAB — URINALYSIS, ROUTINE W REFLEX MICROSCOPIC
BILIRUBIN URINE: NEGATIVE
Glucose, UA: 50 mg/dL — AB
Hgb urine dipstick: NEGATIVE
Ketones, ur: NEGATIVE mg/dL
Leukocytes, UA: NEGATIVE
NITRITE: NEGATIVE
Protein, ur: NEGATIVE mg/dL
SPECIFIC GRAVITY, URINE: 1.018 (ref 1.005–1.030)
pH: 6 (ref 5.0–8.0)

## 2017-12-30 LAB — ACETAMINOPHEN LEVEL: Acetaminophen (Tylenol), Serum: <10 ug/mL — ABNORMAL LOW (ref 10–30)

## 2017-12-30 LAB — ETHANOL: Alcohol, Ethyl (B): <10 mg/dL (ref <10)

## 2017-12-30 LAB — I-STAT TROPONIN, ED: TROPONIN I, POC: 0.01 ng/mL (ref 0.00–0.08)

## 2017-12-30 LAB — I-STAT CG4 LACTIC ACID, ED: Lactic Acid, Venous: 0.5 mmol/L (ref 0.5–1.9)

## 2017-12-30 LAB — SALICYLATE LEVEL

## 2017-12-30 NOTE — ED Provider Notes (Signed)
Patient reportedly could not go to bed earlier today. Her son who is here with her states she looks at baseline. On exam no facial asymmetry. She is able to walk with minimal assistance. Patient walks with walker at baseline   Orlie Dakin, MD 12/30/17 2316

## 2017-12-30 NOTE — ED Notes (Signed)
Pt stable, ambulatory, states understanding of discahrge instructions

## 2017-12-30 NOTE — ED Notes (Signed)
Patient transported to X-ray 

## 2017-12-30 NOTE — ED Notes (Signed)
Spoke with Lance Coon at [facility] stated that it would be okay for son to take pt to [facility] instead of waiting on [facility].

## 2017-12-30 NOTE — ED Triage Notes (Signed)
Pt arrived via [facility] from [facility] c/o AMS. Hx dementia, parkinson's.  EMS gave 12.5mg  D50, 200cc NS.  Right side facial droop, A&Ox3. VAN negative for EMS.

## 2017-12-30 NOTE — Discharge Instructions (Addendum)
Labs look normal today.   It is unclear why there was difficulty walking today. You have walked multiple times in the ER without problems. If you need more assistance during ambulation, you can purchase a cane at any pharmacy. We have given you a prescription for a cane if you prefer to obtain it from a specific pharmacy.   Follow up with your primary care doctor of facility clinical for re-evaluation in 24-48 hours to ensure there are no clinical changes.

## 2017-12-30 NOTE — ED Notes (Signed)
Family requested update from provider

## 2017-12-30 NOTE — ED Provider Notes (Signed)
[facility] EMERGENCY DEPARTMENT Provider Note   CSN: [id number] Arrival date & time: 12/30/17  1826     History   Chief Complaint Chief Complaint  Patient presents with  . Altered Mental Status    HPI Jenna Powell is a 67 y.o. female brought to ER by EMS from [facility] for concern of changes from baseline behavior.  I spoke with staff at [facility] who provided more information.  Reportedly, patient was "not herself" noticed around 4 PM, patient could not get up on her own to take her 4 PM medications.  Typically she ambulates on her own without assist. One staff at facility noticed questionable right-sided facial droop, however per EMS, other staff stated this was not a new finding.  Patient is oriented to self, place and day only. When asked why she is her states that she could not stand up today.  She denies any pain.  No fevers reported by facility staff.  HPI  Past Medical History:  Diagnosis Date  . Dementia   . Diabetes mellitus without complication ([place])   . Hyperlipidemia   . Hypertension     There are no active problems to display for this patient.   History reviewed. No pertinent surgical history.   OB History    Gravida  7   Para      Term      Preterm      AB      Living  7     SAB      TAB      Ectopic      Multiple      Live Births               Home Medications    Prior to Admission medications   Medication Sig Start Date End Date Taking? Authorizing Provider  aspirin 81 MG EC tablet Take 81 mg by mouth daily. Swallow whole.   Yes [provider]  benztropine (COGENTIN) 1 MG tablet Take 1 mg 3 (three) times daily by mouth.    Yes [provider]  Calcium Carbonate-Vitamin D (CALCIUM 600+D) 600-400 MG-UNIT tablet Take 1 tablet by mouth every 12 (twelve) hours.   Yes [provider]  Calcium Polycarbophil (FIBER LAXATIVE PO) Take 1 tablet by mouth 2 (two) times daily.   Yes  [provider]  clozapine (CLOZARIL) 200 MG tablet Take 200 mg 2 (two) times daily by mouth. Take 1 tablet (200 mg) in the morning and Take 1 tablet (200mg ) with 25 mg in the evening (total evening dose = 225 mg)   Yes [provider]  cloZAPine (CLOZARIL) 25 MG tablet Take 25 mg every evening by mouth. Take with 200 mg for evening dose. (Total evening dose = 225 mg)   Yes [provider]  Docusate Sodium 100 MG capsule Take 100 mg by mouth 3 (three) times daily.    Yes [provider]  donepezil (ARICEPT) 10 MG tablet Take 10 mg 2 (two) times daily by mouth.    Yes [provider]  lactulose (CHRONULAC) 10 GM/15ML solution Take 20 g by mouth daily.   Yes [provider]  lithium carbonate (LITHOBID) 300 MG CR tablet Take 450 mg by mouth at bedtime.    Yes [provider]  mirabegron ER (MYRBETRIQ) 50 MG TB24 tablet Take 50 mg by mouth daily.   Yes [provider]  polycarbophil (FIBERCON) 625 MG tablet Take 625 mg 2 (  two) times daily by mouth.   Yes [provider]  Polyvinyl Alcohol (LIQUID TEARS OP) Place 1 drop 3 (three) times daily into both eyes.   Yes [provider]  pravastatin (PRAVACHOL) 20 MG tablet Take 20 mg by mouth every evening.    Yes [provider]  risperidone (RISPERDAL) 4 MG tablet Take 4 mg by mouth 2 (two) times daily.    Yes [provider]  senna (SENOKOT) 8.6 MG TABS tablet Take 2 tablets at bedtime by mouth.   Yes [provider]  sertraline (ZOLOFT) 50 MG tablet Take 50 mg by mouth daily.   Yes [provider]  traZODone (DESYREL) 100 MG tablet Take 100 mg by mouth at bedtime.   Yes [provider]  traZODone (DESYREL) 50 MG tablet Take 25 mg daily by mouth. Taken Daily at 4pm   Yes [provider]    Family History History reviewed. No pertinent family history.  Social History Social History   Tobacco Use  . Smoking  status: Never Smoker  . Smokeless tobacco: Never Used  Substance Use Topics  . Alcohol use: No  . Drug use: Never     Allergies   Patient has no known allergies.   Review of Systems Review of Systems  Musculoskeletal:       Unable to stand up or walk at baseline   All other systems reviewed and are negative.    Physical Exam Updated Vital Signs BP 116/79   Pulse 68   Resp 17   Ht 5\' 5"  (1.651 m)   Wt 99.8 kg (220 lb)   SpO2 99%   BMI 36.61 kg/m   Physical Exam  Constitutional: She appears well-developed and well-nourished. No distress.  Non toxic. Awake.   HENT:  Head: Normocephalic and atraumatic.  Nose: Nose normal.  Moist mucous membranes   Eyes: Pupils are equal, round, and reactive to light. Conjunctivae and EOM are normal.  Neck: Normal range of motion.  Cardiovascular: Normal rate, regular rhythm and intact distal pulses.  2+ DP and radial pulses bilaterally. No LE edema.   Pulmonary/Chest: Effort normal and breath sounds normal.  Abdominal: Soft. Bowel sounds are normal. There is no tenderness.  No G/R/R. No suprapubic or CVA tenderness. Negative Murphy's and McBurney's.   Musculoskeletal: Normal range of motion.  Neurological: She is alert.  Alert and oriented to self, place, day and events. Speech is slow, without obvious dysarthria or dysphasia. Weak hand grip and ankle F/E, symmetric.  Sensation to light touch intact in hands and feet. Normal gait with spotter nearby.  She has weak extremities, symmetric, bilaterally but no frank asymmetric pronator drift or leg drop Normal finger-to-nose. CN I and VIII not tested. CN II-XII grossly intact bilaterally.   Skin: Skin is warm and dry. Capillary refill takes less than 2 seconds.  Psychiatric: She has a normal mood and affect. Her behavior is normal.  Nursing note and vitals reviewed.    ED Treatments / Results  Labs (all labs ordered are listed, but only abnormal results are displayed) Labs  Reviewed  COMPREHENSIVE METABOLIC PANEL - Abnormal; Notable for the following components:      Result Value   Glucose, Bld 103 (*)    Total Protein 6.1 (*)    GFR calc non Af Amer 59 (*)    Anion gap 4 (*)    All other components within normal limits  URINALYSIS, ROUTINE W REFLEX MICROSCOPIC - Abnormal; Notable for the  following components:   Glucose, UA 50 (*)    All other components within normal limits  AMMONIA - Abnormal; Notable for the following components:   Ammonia 45 (*)    All other components within normal limits  LITHIUM LEVEL - Abnormal; Notable for the following components:   Lithium Lvl 0.36 (*)    All other components within normal limits  ACETAMINOPHEN LEVEL - Abnormal; Notable for the following components:   Acetaminophen (Tylenol), Serum <10 (*)    All other components within normal limits  CBC WITH DIFFERENTIAL/PLATELET  SALICYLATE LEVEL  ETHANOL  I-STAT CG4 LACTIC ACID, ED  I-STAT TROPONIN, ED    EKG EKG Interpretation  Date/Time:  Wednesday Dec 30 2017 19:48:04 EDT Ventricular Rate:  70 PR Interval:    QRS Duration: 86 QT Interval:  408 QTC Calculation: 441 R Axis:   17 Text Interpretation:  Sinus rhythm Ventricular premature complex Low voltage, precordial leads Abnormal R-wave progression, early transition No significant change since last tracing Confirmed by Orlie Dakin [phone]) on 12/30/2017 7:56:20 PM   Radiology Dg Chest 2 View  Result Date: 12/30/2017 CLINICAL DATA:  Altered mental status. EXAM: CHEST - 2 VIEW COMPARISON:  06/21/2017 FINDINGS: Mild cardiomegaly. Low lung volumes. Bibasilar atelectasis versus airspace disease. There is elevation of the left hemidiaphragm. No pneumothorax. IMPRESSION: Bibasilar atelectasis versus airspace disease. Electronically Signed   By: Marybelle Killings M.D.   On: 12/30/2017 19:38    Procedures Procedures (including critical care time)  Medications Ordered in ED Medications - No data to  display   Initial Impression / Assessment and Plan / ED Course  I have reviewed the triage vital signs and the nursing notes.  Pertinent labs & imaging results that were available during my care of the patient were reviewed by me and considered in my medical decision making (see chart for details).  Clinical Course as of Dec 30 2300  Wed Dec 30, 2017  1846 Spoke to staff at [facility] who called EMS states she was giving pt her 4 pm meds and pt "not acting like herself", complained of body aches, she could not stand up to take her medications. Typically pt ambulated independently without assist or cane. Staff noticed R sided facial droop, new. Per EMS, other staff at [facility] stated facial droop not new.    [CG]    Clinical Course User Index [CG] Kinnie Feil, PA-C   67 yo here for perceived changes in behavior at 4p by [facility] facility. Reportedly, pt could not get out of bed/walk.  One facility staff noted possible R sided facial droop, however per EMS other staff denied this. I do not see facial droop on my initial exam.  Exam as above reassuring. She is conversational, cooperative. She is willing to ambulate in ER. She ambulated twice in ER with me with minimal assist. No facial asymmetry. Neuro exam grossly normal. VSS. She denies any pain. I spoke to her son at bedside, he states pt does not like to get up, walk or exercise. He tries to encourage her.  She likes to lay in bed most of the day.  He states she is at baseline, no facial droop or changes in mental status.   Labs, EKG, CXR, UA reviewed and reassuring.  Pt mental status unchanged in ER. She is ambulatory and tolerating PO. Unclear etiology of presenting symptoms today. Given reassuring work up, multiple examinations feel pt is appropriate for discharge. I will give her rx for  cane to use at home to facilitate ambulating.  Recommended son pt be evaluated by facility clnician or PCP in 24-48 hours to ensure no  clinical deterioration. He is in agreement.   Final Clinical Impressions(s) / ED Diagnoses   Final diagnoses:  Difficulty walking    ED Discharge Orders        Ordered    Cane adjustable wide base quad     12/30/17 2227       Kinnie Feil, PA-C 12/30/17 [place], MD 12/30/17 [phone]

## 2018-04-08 ENCOUNTER — Ambulatory Visit: Payer: Medicare Other | Admitting: Podiatry

## 2018-10-13 IMAGING — CR DG CHEST 2V
2 series · 2 of 2 positions shown · non-contrast
Comparison: 10/22/2016

CLINICAL DATA: Fever

EXAM:
CHEST  2 VIEW

[chest lat]
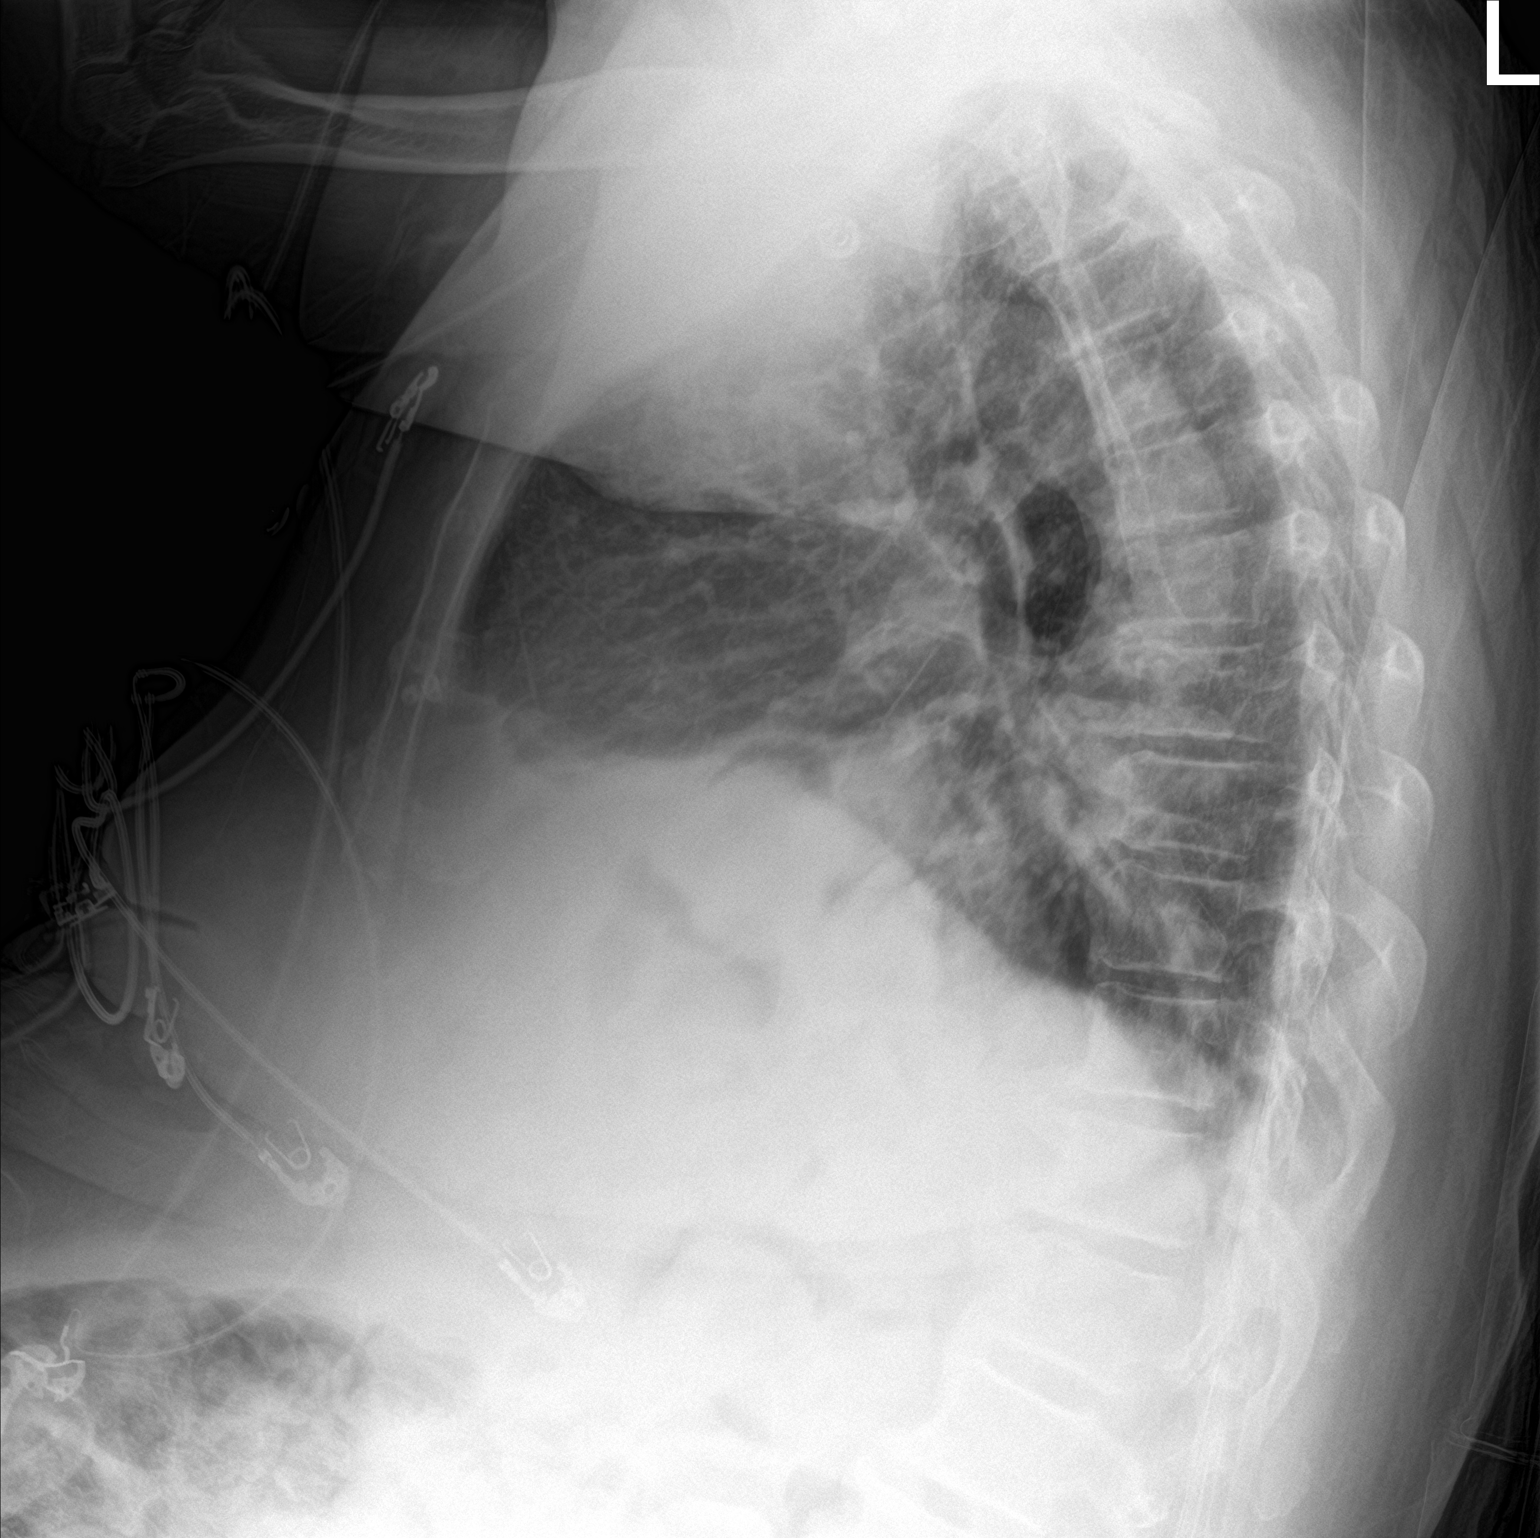

[chest ap]
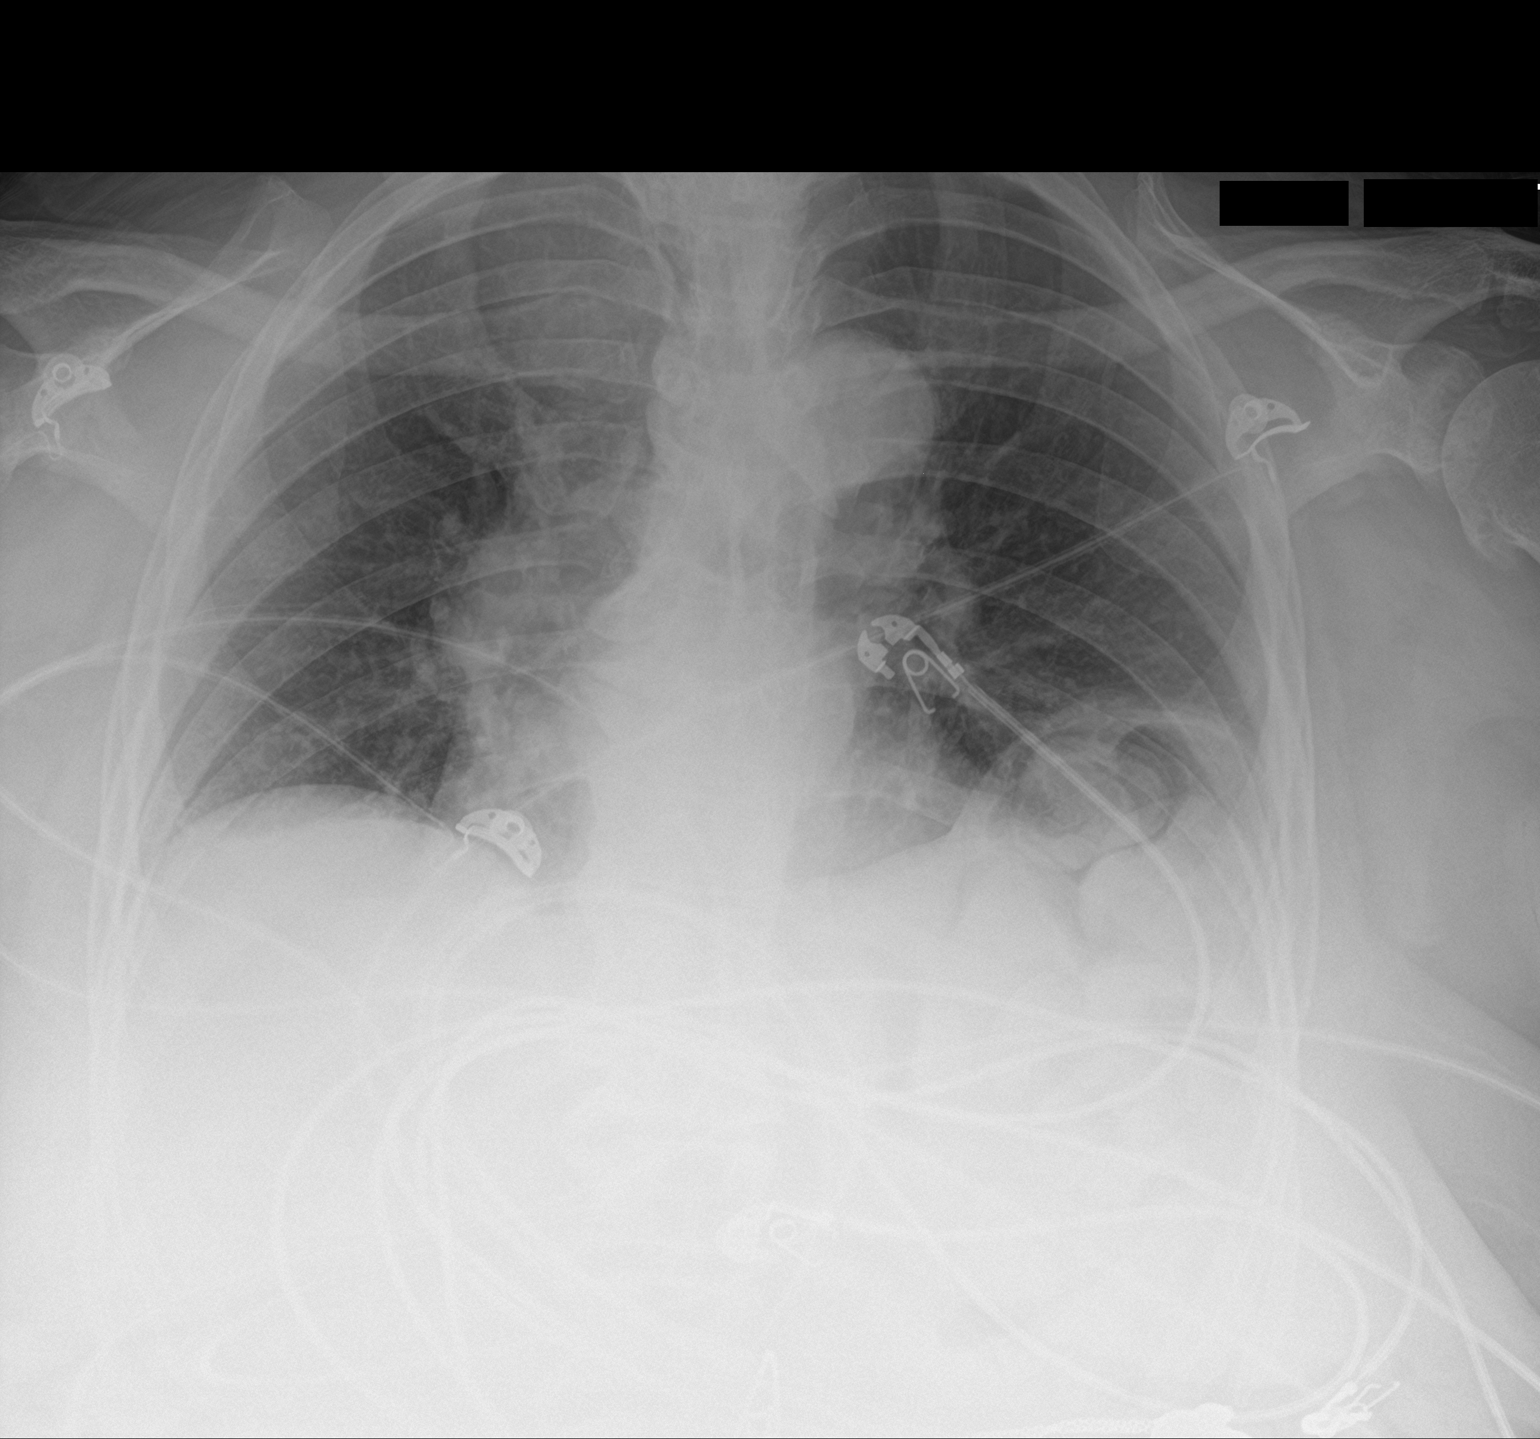

[2 of 2 positions shown; findings below may reference images not displayed]

FINDINGS: Eventration of the left hemidiaphragm. Bibasilar atelectasis and
mild vascular congestion. No overt edema. Heart is upper limits
normal in size. No effusions.
IMPRESSION: Mild vascular congestion.  Bibasilar atelectasis.

## 2018-11-21 ENCOUNTER — Inpatient Hospital Stay (HOSPITAL_COMMUNITY)
Admission: EM | Admit: 2018-11-21 | Discharge: 2018-11-23 | DRG: 093 | Disposition: A | Discharge status: Skilled Nursing Facility | Payer: Medicare Other | Source: Skilled Nursing Facility | Attending: Internal Medicine | Admitting: Internal Medicine

## 2018-11-21 ENCOUNTER — Other Ambulatory Visit: Payer: Self-pay

## 2018-11-21 ENCOUNTER — Emergency Department (HOSPITAL_COMMUNITY): Payer: Medicare Other

## 2018-11-21 ENCOUNTER — Encounter (HOSPITAL_COMMUNITY): Payer: Self-pay

## 2018-11-21 ENCOUNTER — Emergency Department (HOSPITAL_COMMUNITY)
Admission: EM | Admit: 2018-11-21 | Discharge: 2018-11-21 | Discharge status: Absent without leave | Payer: Medicare Other | Source: Home / Self Care | Attending: Emergency Medicine | Admitting: Emergency Medicine

## 2018-11-21 DIAGNOSIS — R400 Somnolence: Secondary | ICD-10-CM | POA: Diagnosis not present

## 2018-11-21 DIAGNOSIS — E119 Type 2 diabetes mellitus without complications: Secondary | ICD-10-CM

## 2018-11-21 DIAGNOSIS — F0390 Unspecified dementia without behavioral disturbance: Secondary | ICD-10-CM | POA: Diagnosis present

## 2018-11-21 DIAGNOSIS — G92 Toxic encephalopathy: Principal | ICD-10-CM | POA: Diagnosis present

## 2018-11-21 DIAGNOSIS — Z87440 Personal history of urinary (tract) infections: Secondary | ICD-10-CM

## 2018-11-21 DIAGNOSIS — G9341 Metabolic encephalopathy: Secondary | ICD-10-CM

## 2018-11-21 DIAGNOSIS — Z7982 Long term (current) use of aspirin: Secondary | ICD-10-CM

## 2018-11-21 DIAGNOSIS — T424X5A Adverse effect of benzodiazepines, initial encounter: Secondary | ICD-10-CM | POA: Diagnosis present

## 2018-11-21 DIAGNOSIS — I10 Essential (primary) hypertension: Secondary | ICD-10-CM | POA: Diagnosis present

## 2018-11-21 DIAGNOSIS — E785 Hyperlipidemia, unspecified: Secondary | ICD-10-CM | POA: Diagnosis present

## 2018-11-21 DIAGNOSIS — T43595A Adverse effect of other antipsychotics and neuroleptics, initial encounter: Secondary | ICD-10-CM | POA: Diagnosis present

## 2018-11-21 DIAGNOSIS — Z79899 Other long term (current) drug therapy: Secondary | ICD-10-CM

## 2018-11-21 DIAGNOSIS — R410 Disorientation, unspecified: Secondary | ICD-10-CM

## 2018-11-21 DIAGNOSIS — T43215A Adverse effect of selective serotonin and norepinephrine reuptake inhibitors, initial encounter: Secondary | ICD-10-CM | POA: Diagnosis present

## 2018-11-21 DIAGNOSIS — F319 Bipolar disorder, unspecified: Secondary | ICD-10-CM | POA: Diagnosis present

## 2018-11-21 DIAGNOSIS — T443X5A Adverse effect of other parasympatholytics [anticholinergics and antimuscarinics] and spasmolytics, initial encounter: Secondary | ICD-10-CM | POA: Diagnosis present

## 2018-11-21 LAB — COMPREHENSIVE METABOLIC PANEL
ALT: 53 U/L — ABNORMAL HIGH (ref 0–44)
AST: 115 U/L — ABNORMAL HIGH (ref 15–41)
Albumin: 4.1 g/dL (ref 3.5–5.0)
Alkaline Phosphatase: 97 U/L (ref 38–126)
Anion gap: 9 (ref 5–15)
BUN: 18 mg/dL (ref 8–23)
CO2: 26 mmol/L (ref 22–32)
Calcium: 9.9 mg/dL (ref 8.9–10.3)
Chloride: 110 mmol/L (ref 98–111)
Creatinine, Ser: 0.95 mg/dL (ref 0.44–1.00)
GFR calc Af Amer: >60 mL/min (ref >60)
GFR calc non Af Amer: >60 mL/min (ref >60)
Glucose, Bld: 100 mg/dL — ABNORMAL HIGH (ref 70–99)
Potassium: 3.9 mmol/L (ref 3.5–5.1)
Sodium: 145 mmol/L (ref 135–145)
Total Bilirubin: 0.7 mg/dL (ref 0.3–1.2)
Total Protein: 6.8 g/dL (ref 6.5–8.1)

## 2018-11-21 LAB — URINALYSIS, ROUTINE W REFLEX MICROSCOPIC
Bilirubin Urine: NEGATIVE
Glucose, UA: NEGATIVE mg/dL
Hgb urine dipstick: NEGATIVE
Ketones, ur: 20 mg/dL — AB
Leukocytes,Ua: NEGATIVE
Nitrite: NEGATIVE
Protein, ur: NEGATIVE mg/dL
Specific Gravity, Urine: 1.021 (ref 1.005–1.030)
pH: 5 (ref 5.0–8.0)

## 2018-11-21 LAB — BLOOD GAS, VENOUS
Acid-Base Excess: 2.8 mmol/L — ABNORMAL HIGH (ref 0.0–2.0)
Bicarbonate: 28.3 mmol/L — ABNORMAL HIGH (ref 20.0–28.0)
O2 Saturation: 86.7 %
Patient temperature: 98.6
pCO2, Ven: 49.9 mmHg (ref 44.0–60.0)
pH, Ven: 7.373 (ref 7.250–7.430)
pO2, Ven: 54.4 mmHg — ABNORMAL HIGH (ref 32.0–45.0)

## 2018-11-21 LAB — ACETAMINOPHEN LEVEL: Acetaminophen (Tylenol), Serum: <10 ug/mL — ABNORMAL LOW (ref 10–30)

## 2018-11-21 LAB — TROPONIN I: Troponin I: <0.03 ng/mL (ref <0.03)

## 2018-11-21 LAB — RAPID URINE DRUG SCREEN, HOSP PERFORMED
Amphetamines: NOT DETECTED
Barbiturates: NOT DETECTED
Benzodiazepines: NOT DETECTED
Cocaine: NOT DETECTED
Opiates: NOT DETECTED
Tetrahydrocannabinol: NOT DETECTED

## 2018-11-21 LAB — CBC WITH DIFFERENTIAL/PLATELET
Abs Immature Granulocytes: 0.02 10*3/uL (ref 0.00–0.07)
Basophils Absolute: 0 10*3/uL (ref 0.0–0.1)
Basophils Relative: 0 %
Eosinophils Absolute: 0.3 10*3/uL (ref 0.0–0.5)
Eosinophils Relative: 3 %
HCT: 42.6 % (ref 36.0–46.0)
Hemoglobin: 12.7 g/dL (ref 12.0–15.0)
Immature Granulocytes: 0 %
Lymphocytes Relative: 15 %
Lymphs Abs: 1.1 10*3/uL (ref 0.7–4.0)
MCH: 27.5 pg (ref 26.0–34.0)
MCHC: 29.8 g/dL — ABNORMAL LOW (ref 30.0–36.0)
MCV: 92.2 fL (ref 80.0–100.0)
Monocytes Absolute: 0.4 10*3/uL (ref 0.1–1.0)
Monocytes Relative: 5 %
Neutro Abs: 5.6 10*3/uL (ref 1.7–7.7)
Neutrophils Relative %: 77 %
Platelets: 216 10*3/uL (ref 150–400)
RBC: 4.62 MIL/uL (ref 3.87–5.11)
RDW: 13.1 % (ref 11.5–15.5)
WBC: 7.3 10*3/uL (ref 4.0–10.5)
nRBC: 0 % (ref 0.0–0.2)

## 2018-11-21 LAB — AMMONIA: Ammonia: 16 umol/L (ref 9–35)

## 2018-11-21 LAB — SALICYLATE LEVEL: Salicylate Lvl: <7 mg/dL (ref 2.8–30.0)

## 2018-11-21 LAB — ETHANOL: Alcohol, Ethyl (B): <10 mg/dL (ref <10)

## 2018-11-21 LAB — LACTIC ACID, PLASMA
Lactic Acid, Venous: 1.2 mmol/L (ref 0.5–1.9)
Lactic Acid, Venous: 1 mmol/L (ref 0.5–1.9)

## 2018-11-21 LAB — LITHIUM LEVEL: Lithium Lvl: 0.36 mmol/L — ABNORMAL LOW (ref 0.60–1.20)

## 2018-11-21 LAB — LIPASE, BLOOD: Lipase: 26 U/L (ref 11–51)

## 2018-11-21 MED ORDER — ENOXAPARIN SODIUM 40 MG/0.4ML ~~LOC~~ SOLN
40.0000 mg | SUBCUTANEOUS | Status: DC
  Administered 2018-11-22: 40 mg via SUBCUTANEOUS
  Filled 2018-11-21: qty 0.4

## 2018-11-21 MED ORDER — ACETAMINOPHEN 325 MG PO TABS: 650.0000 mg | ORAL_TABLET | Freq: Four times a day (QID) | ORAL | Status: DC | PRN

## 2018-11-21 MED ORDER — SODIUM CHLORIDE 0.9 % IV SOLN
INTRAVENOUS | Status: AC
  Administered 2018-11-22: 01:00:00 via INTRAVENOUS

## 2018-11-21 NOTE — ED Provider Notes (Signed)
[facility] DEPT Provider Note   CSN: [id number] Arrival date & time: 11/21/18  1635    History   Chief Complaint Chief Complaint  Patient presents with  . Weakness    HPI Jenna Powell is a 68 y.o. female with a past medical history of dementia, DM, hypertension, hyperlipidemia who presents today for evaluation of generalized weakness.  According to triage notes patient's home health nurse called EMS as she was more weak than usual, and last time this happened she had a UTI.  I attempted to contact [facility] for additional information, and was directed to a voicemail box.  Patient is unable to provide full history.  She denies any pain.   She frequently switches to speaking in what I assume is [place] based on previous notes.   Chart review shows that she has been seen previously for AMS which was felt to be secondary to polypharmacy.     HPI  Past Medical History:  Diagnosis Date  . Dementia ([place])   . Diabetes mellitus without complication ([place])   . Hyperlipidemia   . Hypertension     There are no active problems to display for this patient.   No past surgical history on file.   OB History    Gravida  7   Para      Term      Preterm      AB      Living  7     SAB      TAB      Ectopic      Multiple      Live Births               Home Medications    Prior to Admission medications   Medication Sig Start Date End Date Taking? Authorizing Provider  aspirin 81 MG EC tablet Take 81 mg by mouth daily. Swallow whole.    [provider]  benztropine (COGENTIN) 1 MG tablet Take 1 mg 3 (three) times daily by mouth.     [provider]  Calcium Carbonate-Vitamin D (CALCIUM 600+D) 600-400 MG-UNIT tablet Take 1 tablet by mouth every 12 (twelve) hours.    [provider]  Calcium Polycarbophil (FIBER LAXATIVE PO) Take 1 tablet by mouth 2 (two) times daily.    [provider]  clozapine  (CLOZARIL) 200 MG tablet Take 200 mg 2 (two) times daily by mouth. Take 1 tablet (200 mg) in the morning and Take 1 tablet ('200mg'$ ) with 25 mg in the evening (total evening dose = 225 mg)    [provider]  cloZAPine (CLOZARIL) 25 MG tablet Take 25 mg every evening by mouth. Take with 200 mg for evening dose. (Total evening dose = 225 mg)    [provider]  Docusate Sodium 100 MG capsule Take 100 mg by mouth 3 (three) times daily.     [provider]  donepezil (ARICEPT) 10 MG tablet Take 10 mg 2 (two) times daily by mouth.     [provider]  lactulose (CHRONULAC) 10 GM/15ML solution Take 20 g by mouth daily.    [provider]  lithium carbonate (LITHOBID) 300 MG CR tablet Take 450 mg by mouth at bedtime.     [provider]  mirabegron ER (MYRBETRIQ) 50 MG TB24 tablet Take 50 mg by mouth daily.    [provider]  polycarbophil (FIBERCON) 625 MG tablet Take 625 mg 2 (two) times daily by  mouth.    [provider]  Polyvinyl Alcohol (LIQUID TEARS OP) Place 1 drop 3 (three) times daily into both eyes.    [provider]  pravastatin (PRAVACHOL) 20 MG tablet Take 20 mg by mouth every evening.     [provider]  risperidone (RISPERDAL) 4 MG tablet Take 4 mg by mouth 2 (two) times daily.     [provider]  senna (SENOKOT) 8.6 MG TABS tablet Take 2 tablets at bedtime by mouth.    [provider]  sertraline (ZOLOFT) 50 MG tablet Take 50 mg by mouth daily.    [provider]  traZODone (DESYREL) 100 MG tablet Take 100 mg by mouth at bedtime.    [provider]  traZODone (DESYREL) 50 MG tablet Take 25 mg daily by mouth. Taken Daily at [employer], Historical, MD    Family History No family history on file.  Social History Social History   Tobacco Use  . Smoking status: Never Smoker  . Smokeless tobacco: Never Used  Substance Use Topics  . Alcohol use: No  .  Drug use: Never     Allergies   Patient has no known allergies.   Review of Systems Review of Systems  Unable to perform ROS: Dementia     Physical Exam Updated Vital Signs BP 127/79 (BP Location: Left Arm)   Pulse 94   Temp 98.4 F (36.9 C) (Rectal)   Resp 16   Ht '5\' 1"'$  (1.549 m)   Wt 90.7 kg   SpO2 99%   BMI 37.79 kg/m   Physical Exam Vitals signs and nursing note reviewed.  Constitutional:      General: She is not in acute distress.    Appearance: She is well-developed.     Comments: Warm to touch Opens eyes to voice, frequently mumbles  HENT:     Head: Normocephalic and atraumatic.     Nose: Nose normal.     Mouth/Throat:     Mouth: Mucous membranes are dry.  Eyes:     Conjunctiva/sclera: Conjunctivae normal.  Neck:     Musculoskeletal: Normal range of motion and neck supple. No neck rigidity or muscular tenderness.  Cardiovascular:     Rate and Rhythm: Normal rate and regular rhythm.     Heart sounds: Normal heart sounds. No murmur.  Pulmonary:     Effort: Pulmonary effort is normal. No respiratory distress.     Breath sounds: Normal breath sounds.  Abdominal:     General: There is no distension.     Palpations: Abdomen is soft. There is no mass.     Tenderness: There is no abdominal tenderness.  Skin:    General: Skin is warm and dry.  Neurological:     Comments: 4/5 strength in bilateral upper and lower extremities.  No facial droop.  She is able to answer yes/no questions, however question reliability of her answers.  She believes the year is 2018.  She frequently starts speaking what I assume is [place] but unable to tell.  She is somnolent, will awaken to voice.   Psychiatric:        Mood and Affect: Mood normal.      ED Treatments / Results  Labs (all labs ordered are listed, but only abnormal results are displayed) Labs Reviewed  URINALYSIS, ROUTINE W REFLEX MICROSCOPIC - Abnormal; Notable for the following components:      Result Value    Ketones, ur 20 (*)  All other components within normal limits  COMPREHENSIVE METABOLIC PANEL - Abnormal; Notable for the following components:   Glucose, Bld 100 (*)    AST 115 (*)    ALT 53 (*)    All other components within normal limits  CBC WITH DIFFERENTIAL/PLATELET - Abnormal; Notable for the following components:   MCHC 29.8 (*)    All other components within normal limits  LITHIUM LEVEL - Abnormal; Notable for the following components:   Lithium Lvl 0.36 (*)    All other components within normal limits  ACETAMINOPHEN LEVEL - Abnormal; Notable for the following components:   Acetaminophen (Tylenol), Serum <10 (*)    All other components within normal limits  URINE CULTURE  CULTURE, BLOOD (ROUTINE X 2)  CULTURE, BLOOD (ROUTINE X 2) W REFLEX TO ID PANEL  LACTIC ACID, PLASMA  LACTIC ACID, PLASMA  LIPASE, BLOOD  SALICYLATE LEVEL  RAPID URINE DRUG SCREEN, HOSP PERFORMED  ETHANOL  TROPONIN I  AMMONIA  CBG MONITORING, ED    EKG None  Radiology Ct Head Wo Contrast  Result Date: 11/21/2018 CLINICAL DATA:  Altered mental status EXAM: CT HEAD WITHOUT CONTRAST TECHNIQUE: Contiguous axial images were obtained from the base of the skull through the vertex without intravenous contrast. COMPARISON:  Head CT 10/22/2016 FINDINGS: Brain: There is no mass, hemorrhage or extra-axial collection. The size and configuration of the ventricles and extra-axial CSF spaces are normal. The brain parenchyma is normal, without acute or chronic infarction. Vascular: No abnormal hyperdensity of the major intracranial arteries or dural venous sinuses. No intracranial atherosclerosis. Skull: The visualized skull base, calvarium and extracranial soft tissues are normal. Sinuses/Orbits: No fluid levels or advanced mucosal thickening of the visualized paranasal sinuses. No mastoid or middle ear effusion. The orbits are normal. IMPRESSION: Normal head CT. Electronically Signed   By: Ulyses Jarred M.D.   On:  11/21/2018 19:01    Procedures Procedures (including critical care time)  Medications Ordered in ED Medications - No data to display   Initial Impression / Assessment and Plan / ED Course  I have reviewed the triage vital signs and the nursing notes.  Pertinent labs & imaging results that were available during my care of the patient were reviewed by me and considered in my medical decision making (see chart for details).  Clinical Course as of Nov 21 2151  Sun Nov 21, 2018  1708 [facility], attempted to contact, no answer.    [EH]  2151 Spoke with hospitalist who will admit.    [EH]    Clinical Course User Index [EH] Lorin Glass, PA-C      Patient presents today by EMS for evaluation of confusion.  She is somnolent however will awaken to voice.  She is unable to provide significant history however she was sent for possible UTI and weakness by home health nurse.  I attempted to contact [facility] without answer and was sent to a voicemail box.  Patient is unable to tell me her name or what year it is.  Labs are obtained and reviewed.  Ammonia is not elevated.  Lactic acid is not elevated.  As she felt hot on initial exam with the confusion had a high suspicion for UTI sepsis therefore blood cultures were obtained.  She is afebrile, and not tachypneic.  She is borderline tachycardic on arrival.  White count is not elevated at 7.3.  She is not anemic.  AST and ALT are both slightly elevated however alk phos is  normal and her abdomen is soft nontender nondistended.  UDS is negative.  Acetaminophen, ethanol, and salicylate levels are undetectable.  Her lithium level is 0.36.  Her lipase is not elevated.  UA does not show evidence of infection with 20 ketones, however urine culture was sent.  CT head does not show evidence of acute abnormality.  This patient was seen as a shared visit with Dr. Kathrynn Humble who agreed with plan for admission.  Patient lives in assisted living, I  was unable to get in contact with anyone from her facility who knows her, and I do not feel like I can safely discharge her with this confusion.  I spoke with hosptialist who agreed to admit patient.    Final Clinical Impressions(s) / ED Diagnoses   Final diagnoses:  Confusion    ED Discharge Orders    None       Ollen Gross 11/21/18 2158    Varney Biles, MD 11/23/18 [phone]

## 2018-11-21 NOTE — ED Notes (Signed)
Attempted to call report, no answer

## 2018-11-21 NOTE — H&P (Signed)
History and Physical  Jenna Powell FBP:[id number] DOB: 11-29-1950 DOA: 11/21/2018 1637  Referring physician: Conard Novak [facility] ED) PCP: System, Pcp Not In  Outpatient Specialists: n/a  HISTORY   Chief Complaint: altered mentation  HPI: Jenna Powell is a 68 y.o. female with hx of dementia, bipolar disorder, DMT2, HTN who presented from [place] by EMS for reported increased somnolence. History unable to obtained directly from [facility] despite attempts from ED and this provider to call the facility (voicemail left). Per EMS report, the facility was concerned for "UTI" given drowsiness noted by home health aide. No reported fevers or focal infectious symptoms. It appears that patient resides in ALF, thus suggesting that she is able to perform ADLs at baseline. Otherwise no clear symptoms reported by EMS or ED. Patient is unable to provide history on my exam. Of note, patient is on several psychotropic medications. Chart review reveals that patient has had prior encounters for AMS that were thought to be due to polypharmacy.    Review of Systems: unable to obtain from patient  Rest of systems reviewed are negative, except as per above history.   ED course:  Vitals Blood pressure 98/69, pulse 87, temperature 98.4 F (36.9 C), temperature source Rectal, resp. rate 16, height 5\' 1"  (1.549 m), weight 90.7 kg, SpO2 96 %.  No medications received in ED.   Past Medical History:  Diagnosis Date  . Dementia ([employer])   . Diabetes mellitus without complication ([place])   . Hyperlipidemia   . Hypertension    No past surgical history on file.  Social History:  reports that she has never smoked. She has never used smokeless tobacco. She reports that she does not drink alcohol or use drugs.  No Known Allergies  No family history on file.    Prior to Admission medications   Medication Sig Start Date End Date Taking? Authorizing Provider  aspirin 81 MG EC tablet Take 81 mg by mouth  daily. Swallow whole.    [provider]  benztropine (COGENTIN) 1 MG tablet Take 1 mg 3 (three) times daily by mouth.     [provider]  Calcium Carbonate-Vitamin D (CALCIUM 600+D) 600-400 MG-UNIT tablet Take 1 tablet by mouth every 12 (twelve) hours.    [provider]  Calcium Polycarbophil (FIBER LAXATIVE PO) Take 1 tablet by mouth 2 (two) times daily.    [provider]  clozapine (CLOZARIL) 200 MG tablet Take 200 mg 2 (two) times daily by mouth. Take 1 tablet (200 mg) in the morning and Take 1 tablet (200mg ) with 25 mg in the evening (total evening dose = 225 mg)    [provider]  cloZAPine (CLOZARIL) 25 MG tablet Take 25 mg every evening by mouth. Take with 200 mg for evening dose. (Total evening dose = 225 mg)    [provider]  Docusate Sodium 100 MG capsule Take 100 mg by mouth 3 (three) times daily.     [provider]  donepezil (ARICEPT) 10 MG tablet Take 10 mg 2 (two) times daily by mouth.     [provider]  lactulose (CHRONULAC) 10 GM/15ML solution Take 20 g by mouth daily.    [provider]  lithium carbonate (LITHOBID) 300 MG CR tablet Take 450 mg by mouth at bedtime.     [provider]  mirabegron ER (MYRBETRIQ) 50 MG TB24 tablet Take 50 mg by mouth daily.    [provider]  polycarbophil (  FIBERCON) 625 MG tablet Take 625 mg 2 (two) times daily by mouth.    [provider]  Polyvinyl Alcohol (LIQUID TEARS OP) Place 1 drop 3 (three) times daily into both eyes.    [provider]  pravastatin (PRAVACHOL) 20 MG tablet Take 20 mg by mouth every evening.     [provider]  risperidone (RISPERDAL) 4 MG tablet Take 4 mg by mouth 2 (two) times daily.     [provider]  senna (SENOKOT) 8.6 MG TABS tablet Take 2 tablets at bedtime by mouth.    [provider]  sertraline (ZOLOFT) 50 MG tablet Take 50 mg by mouth daily.    [provider]  traZODone (DESYREL) 100 MG tablet Take 100 mg by mouth at bedtime.    [provider]  traZODone (DESYREL) 50 MG tablet Take 25 mg daily by mouth. Taken Daily at [employer], Historical, MD    PHYSICAL EXAM   Temp:  [98.4 F (36.9 C)] 98.4 F (36.9 C) (04/19 1642) Pulse Rate:  [87-94] 87 (04/19 2200) Resp:  [16] 16 (04/19 2200) BP: (98-127)/(69-79) 98/69 (04/19 2200) SpO2:  [96 %-99 %] 96 % (04/19 2200) Weight:  [90.7 kg] 90.7 kg (04/19 1645)  BP 98/69   Pulse 87   Temp 98.4 F (36.9 C) (Rectal)   Resp 16   Ht 5\' 1"  (1.549 m)   Wt 90.7 kg   SpO2 96%   BMI 37.79 kg/m    GEN obese elderly african-american female; sleepy, opens eyes and smiles to voice and speaks but in English/Creole mix, only intermittently follows commands  HEENT NCAT; constricted pinpoint pupils b/l; clear oropharynx, no cervical LAD; moist mucus membranes  JVP estimated 5 cm H2O above RA; no HJR ; no carotid bruits b/l ;  CV regular normal rate; normal S1 and S2; no m/r/g; no parasternal heave  RESP CTA b/l; breathing unlabored and symmetric  ABD soft NT ND obese +normoactive BS  EXT warm throughout b/l; no peripheral edema b/l  PULSES  DP and radials 2+ intact b/l  SKIN/MSK no rashes or lesions  NEURO/PSYCH  Somnolent; answers a different first name (difficult to understand) than stated in chart; opens eyes intermittently and smiles; moves all 4 limbs. GCS score 11-12   DATA   LABS ON ADMISSION:  Basic Metabolic Panel: Recent Labs  Lab 11/21/18 1740  NA 145  K 3.9  CL 110  CO2 26  GLUCOSE 100*  BUN 18  CREATININE 0.95  CALCIUM 9.9   CBC: Recent Labs  Lab 11/21/18 1740  WBC 7.3  NEUTROABS 5.6  HGB 12.7  HCT 42.6  MCV 92.2  PLT 216   Liver Function Tests: Recent Labs  Lab 11/21/18 1740  AST 115*  ALT 53*  ALKPHOS 97  BILITOT 0.7  PROT 6.8  ALBUMIN 4.1   Recent Labs  Lab 11/21/18 1740  LIPASE 26   Recent Labs  Lab 11/21/18 2026  AMMONIA  16   Coagulation:  No results found for: INR, PROTIME No results found for: PTT Lactic Acid, Venous:     Component Value Date/Time   LATICACIDVEN 1.0 11/21/2018 1931   Cardiac Enzymes: Recent Labs  Lab 11/21/18 1740  TROPONINI <0.03   Urinalysis:    Component Value Date/Time   COLORURINE YELLOW 11/21/2018 [place] 11/21/2018 1659   LABSPEC 1.021 11/21/2018 1659   PHURINE 5.0 11/21/2018 [place] 11/21/2018 1659  HGBUR NEGATIVE 11/21/2018 [place] 11/21/2018 1659   KETONESUR 20 (A) 11/21/2018 1659   PROTEINUR NEGATIVE 11/21/2018 1659   NITRITE NEGATIVE 11/21/2018 1659   LEUKOCYTESUR NEGATIVE 11/21/2018 1659   Results for TERESITA, FANTON (MRN [id number]) as of 11/21/2018 22:00  Ref. Range 11/21/2018 17:18 11/21/2018 17:41  Alcohol, Ethyl (B) Latest Ref Range: <50 mg/dL  <35  Salicylate Lvl Latest Ref Range: 2.8 - 30.0 mg/dL  <7.0  Amphetamines Latest Ref Range: NONE DETECTED  NONE DETECTED   Barbiturates Latest Ref Range: NONE DETECTED  NONE DETECTED   Benzodiazepines Latest Ref Range: NONE DETECTED  NONE DETECTED   Opiates Latest Ref Range: NONE DETECTED  NONE DETECTED   COCAINE Latest Ref Range: NONE DETECTED  NONE DETECTED   Tetrahydrocannabinol Latest Ref Range: NONE DETECTED  NONE DETECTED    Recent Labs  Lab 11/21/18 2026  AMMONIA 16    BNP (last 3 results) No results for input(s): PROBNP in the last 8760 hours. CBG: No results for input(s): GLUCAP in the last 168 hours.  Radiological Exams on Admission: Ct Head Wo Contrast  Result Date: 11/21/2018 CLINICAL DATA:  Altered mental status EXAM: CT HEAD WITHOUT CONTRAST TECHNIQUE: Contiguous axial images were obtained from the base of the skull through the vertex without intravenous contrast. COMPARISON:  Head CT 10/22/2016 FINDINGS: Brain: There is no mass, hemorrhage or extra-axial collection. The size and configuration of the ventricles and extra-axial CSF  spaces are normal. The brain parenchyma is normal, without acute or chronic infarction. Vascular: No abnormal hyperdensity of the major intracranial arteries or dural venous sinuses. No intracranial atherosclerosis. Skull: The visualized skull base, calvarium and extracranial soft tissues are normal. Sinuses/Orbits: No fluid levels or advanced mucosal thickening of the visualized paranasal sinuses. No mastoid or middle ear effusion. The orbits are normal. IMPRESSION: Normal head CT. Electronically Signed   By: Ulyses Jarred M.D.   On: 11/21/2018 19:01    EKG: Independently reviewed. PENDING  I have reviewed the patient's previous electronic chart records, labs, and other data.   ASSESSMENT AND PLAN   Assessment: Aaliyah Gavel is a 68 y.o. female with hx of dementia, bipolar disorder, DMT2, HTN who presented from [place] by EMS for reported increased somnolence. Initial concern for UTI per the facility but exam and labwork (including workup for common toxins) are unremarkable except for pinpoint pupils and overall no focal symptoms or signs of infection. Review of her extensive medication list suggests that she may be at risk of polypharmacy. Will add VBG and clozapine level (which can cause pupil constriction and sedation). Protecting airway adequately at this time. Will monitor overnight and re-assess mentation. If no improvement in 12-24 hours, will consider imaging, ie pan scan.    Active Problems:   Somnolence  Plan:   # Somnolence (wakens to voice) and confusion. No infectious sx or signs. Suspect polypharmacy, perhaps from clozapine + risperidone > initial labs including utox negative, lithium low. - lithium level low at 0.36 - blood and urine cultures pending - pending vbg - pending clozapine level - IV fluids at 100 cc/hr - tele sitter ordered - if somnolence worsens/concern for airway protection, empiric narcan - hold all psych meds at this time - NPO while altered -  attempt to contact [facility] again tomorrow re: baseline and history   # Reported hx of dementia and bipolar, on several psych meds > see home med list - hold all meds including risperdal, traozodone, donepezil, lithium,  clozapine given AMS  # Hx of DMT2 - sugars 100 on admission > not on diabetes meds per home list  - check fingersticks AC   - no insulin at this time   DVT Prophylaxis: lovenox  Code Status:  Full Code Family Communication: called facility without response;   Disposition Plan: admit as observation, dispo (to [place]) pending clinical improvement   Patient contact: Extended Emergency Contact Information Primary Emergency Contact: [place] Phone: [phone] Relation: Son  Time spent: > 35 mins  Colbert Ewing, MD [facility] Pager [phone]  If 7PM-7AM, please contact night-coverage [email or web address] Password [facility] 11/21/2018, 10:07 PM

## 2018-11-21 NOTE — ED Notes (Signed)
Admitting MD at bedside.

## 2018-11-21 NOTE — ED Triage Notes (Signed)
Her home-health nurse called EMS d/t pt. "being a little more weak than usual, and in the past, she has acted like this and it has been a u.t.i." pt. Is awake, alert and in no distress.

## 2018-11-21 NOTE — ED Notes (Signed)
ED TO INPATIENT HANDOFF REPORT  Name/Age/Gender Jenna Powell 68 y.o. female  Code Status    Code Status Orders  (From admission, onward)         Start     Ordered   11/21/18 2204  Full code  Continuous     11/21/18 2204        Code Status History    This patient has a current code status but no historical code status.      Home/SNF/Other Home  Chief Complaint Vaginal Pain  Level of Care/Admitting Diagnosis ED Disposition    ED Disposition Condition [facility] Area: [facility] [100102]  Level of Care: Med-Surg [16]  Covid Evaluation: N/A  Diagnosis: Somnolence [283662]  Admitting Physician: Colbert Ewing [[id number]]  Attending Physician: Colbert Ewing [[id number]]  PT Class (Do Not Modify): Observation [104]  PT Acc Code (Do Not Modify): Observation [10022]       Medical History Past Medical History:  Diagnosis Date  . Dementia ([place])   . Diabetes mellitus without complication ([place])   . Hyperlipidemia   . Hypertension     Allergies No Known Allergies  IV Location/Drains/Wounds Patient Lines/Drains/Airways Status   Active Line/Drains/Airways    None          Labs/Imaging Results for orders placed or performed during the hospital encounter of 11/21/18 (from the past 48 hour(s))  Urinalysis, Routine w reflex microscopic- may I&O cath if menses     Status: Abnormal   Collection Time: 11/21/18  4:59 PM  Result Value Ref Range   Color, Urine YELLOW YELLOW   APPearance CLEAR CLEAR   Specific Gravity, Urine 1.021 1.005 - 1.030   pH 5.0 5.0 - 8.0   Glucose, UA NEGATIVE NEGATIVE mg/dL   Hgb urine dipstick NEGATIVE NEGATIVE   Bilirubin Urine NEGATIVE NEGATIVE   Ketones, ur 20 (A) NEGATIVE mg/dL   Protein, ur NEGATIVE NEGATIVE mg/dL   Nitrite NEGATIVE NEGATIVE   Leukocytes,Ua NEGATIVE NEGATIVE    Comment: Performed at [facility], [place] [address], [place], [place] [address]  Urine rapid drug  screen (hosp performed)     Status: None   Collection Time: 11/21/18  5:18 PM  Result Value Ref Range   Opiates NONE DETECTED NONE DETECTED   Cocaine NONE DETECTED NONE DETECTED   Benzodiazepines NONE DETECTED NONE DETECTED   Amphetamines NONE DETECTED NONE DETECTED   Tetrahydrocannabinol NONE DETECTED NONE DETECTED   Barbiturates NONE DETECTED NONE DETECTED    Comment: (NOTE) DRUG SCREEN FOR MEDICAL PURPOSES ONLY.  IF CONFIRMATION IS NEEDED FOR ANY PURPOSE, NOTIFY LAB WITHIN 5 DAYS. LOWEST DETECTABLE LIMITS FOR URINE DRUG SCREEN Drug Class                     Cutoff (ng/mL) Amphetamine and metabolites    1000 Barbiturate and metabolites    200 Benzodiazepine                 517 Tricyclics and metabolites     300 Opiates and metabolites        300 Cocaine and metabolites        300 THC                            50 Performed at [facility], [place] [address], [place], [place] [address]   Comprehensive metabolic panel  Status: Abnormal   Collection Time: 11/21/18  5:40 PM  Result Value Ref Range   Sodium 145 135 - 145 mmol/L   Potassium 3.9 3.5 - 5.1 mmol/L   Chloride 110 98 - 111 mmol/L   CO2 26 22 - 32 mmol/L   Glucose, Bld 100 (H) 70 - 99 mg/dL   BUN 18 8 - 23 mg/dL   Creatinine, Ser 0.95 0.44 - 1.00 mg/dL   Calcium 9.9 8.9 - 10.3 mg/dL   Total Protein 6.8 6.5 - 8.1 g/dL   Albumin 4.1 3.5 - 5.0 g/dL   AST 115 (H) 15 - 41 U/L   ALT 53 (H) 0 - 44 U/L   Alkaline Phosphatase 97 38 - 126 U/L   Total Bilirubin 0.7 0.3 - 1.2 mg/dL   GFR calc non Af Amer >60 >60 mL/min   GFR calc Af Amer >60 >60 mL/min   Anion gap 9 5 - 15    Comment: Performed at [facility], [place] [address] [place], [place] [address]  CBC with Differential     Status: Abnormal   Collection Time: 11/21/18  5:40 PM  Result Value Ref Range   WBC 7.3 4.0 - 10.5 K/uL   RBC 4.62 3.87 - 5.11 MIL/uL   Hemoglobin 12.7 12.0 - 15.0 g/dL   HCT 42.6 36.0 - 46.0 %   MCV  92.2 80.0 - 100.0 fL   MCH 27.5 26.0 - 34.0 pg   MCHC 29.8 (L) 30.0 - 36.0 g/dL   RDW 13.1 11.5 - 15.5 %   Platelets 216 150 - 400 K/uL   nRBC 0.0 0.0 - 0.2 %   Neutrophils Relative % 77 %   Neutro Abs 5.6 1.7 - 7.7 K/uL   Lymphocytes Relative 15 %   Lymphs Abs 1.1 0.7 - 4.0 K/uL   Monocytes Relative 5 %   Monocytes Absolute 0.4 0.1 - 1.0 K/uL   Eosinophils Relative 3 %   Eosinophils Absolute 0.3 0.0 - 0.5 K/uL   Basophils Relative 0 %   Basophils Absolute 0.0 0.0 - 0.1 K/uL   Immature Granulocytes 0 %   Abs Immature Granulocytes 0.02 0.00 - 0.07 K/uL    Comment: Performed at [facility], [place] [address] [place], [place] [address]  Lipase, blood     Status: None   Collection Time: 11/21/18  5:40 PM  Result Value Ref Range   Lipase 26 11 - 51 U/L    Comment: Performed at [facility], [place] [address], [place], [place] [address]  Troponin I - Once     Status: None   Collection Time: 11/21/18  5:40 PM  Result Value Ref Range   Troponin I <0.03 <0.03 ng/mL    Comment: Performed at [facility], [place] [address] [place], [place] [address]  Lactic acid, plasma     Status: None   Collection Time: 11/21/18  5:41 PM  Result Value Ref Range   Lactic Acid, Venous 1.2 0.5 - 1.9 mmol/L    Comment: Performed at [facility], [place] [address], [place], [place] [address]  Acetaminophen level     Status: Abnormal   Collection Time: 11/21/18  5:41 PM  Result Value Ref Range   Acetaminophen (Tylenol), Serum <10 (L) 10 - 30 ug/mL    Comment: (NOTE) Therapeutic concentrations vary significantly. A range of 10-30 ug/mL  may be an effective concentration for many patients. However, some  are best treated  at concentrations outside of this range. Acetaminophen concentrations >150 ug/mL at 4 hours after ingestion  and >50 ug/mL at 12 hours after ingestion are often associated with  toxic reactions. Performed at [facility], [place] [address] [place], [place] [address]   Salicylate level     Status: None   Collection Time: 11/21/18  5:41 PM  Result Value Ref Range   Salicylate Lvl <4.7 2.8 - 30.0 mg/dL    Comment: Performed at [facility], [place] [address] [place], [place] [address]  Ethanol     Status: None   Collection Time: 11/21/18  5:41 PM  Result Value Ref Range   Alcohol, Ethyl (B) <10 <10 mg/dL    Comment: (NOTE) Lowest detectable limit for serum alcohol is 10 mg/dL. For medical purposes only. Performed at [facility], [place] [address] [place], [place] [address]   Lithium level     Status: Abnormal   Collection Time: 11/21/18  5:50 PM  Result Value Ref Range   Lithium Lvl 0.36 (L) 0.60 - 1.20 mmol/L    Comment: Performed at [facility], [place] [address] [place], [place] [address]  Lactic acid, plasma     Status: None   Collection Time: 11/21/18  7:31 PM  Result Value Ref Range   Lactic Acid, Venous 1.0 0.5 - 1.9 mmol/L    Comment: Performed at [facility], [place] [address], [place], [place] [address]  Ammonia     Status: None   Collection Time: 11/21/18  8:26 PM  Result Value Ref Range   Ammonia 16 9 - 35 umol/L    Comment: Performed at [facility], [place] [address], [place], [place] [address]   Ct Head Wo Contrast  Result Date: 11/21/2018 CLINICAL DATA:  Altered mental status EXAM: CT HEAD WITHOUT CONTRAST TECHNIQUE: Contiguous axial images were obtained from the base of the skull through the vertex without intravenous contrast. COMPARISON:  Head CT 10/22/2016 FINDINGS: Brain: There is no mass, hemorrhage or extra-axial collection. The size and configuration of the ventricles and extra-axial CSF spaces are normal. The brain parenchyma is normal, without acute or chronic infarction. Vascular: No abnormal hyperdensity of the major intracranial arteries or dural venous sinuses. No  intracranial atherosclerosis. Skull: The visualized skull base, calvarium and extracranial soft tissues are normal. Sinuses/Orbits: No fluid levels or advanced mucosal thickening of the visualized paranasal sinuses. No mastoid or middle ear effusion. The orbits are normal. IMPRESSION: Normal head CT. Electronically Signed   By: Ulyses Jarred M.D.   On: 11/21/2018 19:01    Pending Labs Unresulted Labs (From admission, onward)    Start     Ordered   11/22/18 0500  CBC with Differential/Platelet  Daily,   R     11/21/18 2204   11/22/18 0500  Magnesium  Daily,   R     11/21/18 2204   11/21/18 2207  Clozapine (clozaril)  Add-on,   R     11/21/18 2206   11/21/18 2206  Blood gas, venous  Once,   R     11/21/18 2205   11/21/18 2204  Comprehensive metabolic panel  Once,   R     11/21/18 2204   11/21/18 1758  Culture, blood (Routine X 2) w Reflex to ID Panel  Once,   R     11/21/18 1758   11/21/18 1719  Culture, blood (routine x 2)  BLOOD CULTURE X 2,  STAT     11/21/18 1718   11/21/18 1708  Urine culture  ONCE - STAT,   STAT     11/21/18 1707          Vitals/Pain Today's Vitals   11/21/18 1642 11/21/18 1645 11/21/18 1920 11/21/18 2200  BP:   127/79 98/69  Pulse:   94 87  Resp:   16 16  Temp: 98.4 F (36.9 C)     TempSrc: Rectal     SpO2:   99% 96%  Weight:  90.7 kg    Height:  5\' 1"  (1.549 m)      Isolation Precautions No active isolations  Medications Medications  enoxaparin (LOVENOX) injection 40 mg (has no administration in time range)  acetaminophen (TYLENOL) tablet 650 mg (has no administration in time range)  0.9 %  sodium chloride infusion (has no administration in time range)    Mobility non-ambulatory

## 2018-11-21 NOTE — ED Notes (Signed)
Report given to floor Bl[place], RN

## 2018-11-21 NOTE — ED Notes (Signed)
Bed: OJ50 Expected date:  Expected time:  Means of arrival:  Comments: EMS- [phone] Female, Urinary Frequency

## 2018-11-22 ENCOUNTER — Encounter (HOSPITAL_COMMUNITY): Payer: Self-pay

## 2018-11-22 DIAGNOSIS — F319 Bipolar disorder, unspecified: Secondary | ICD-10-CM | POA: Diagnosis present

## 2018-11-22 DIAGNOSIS — Z87440 Personal history of urinary (tract) infections: Secondary | ICD-10-CM | POA: Diagnosis not present

## 2018-11-22 DIAGNOSIS — T424X5A Adverse effect of benzodiazepines, initial encounter: Secondary | ICD-10-CM | POA: Diagnosis present

## 2018-11-22 DIAGNOSIS — T43595A Adverse effect of other antipsychotics and neuroleptics, initial encounter: Secondary | ICD-10-CM | POA: Diagnosis present

## 2018-11-22 DIAGNOSIS — G92 Toxic encephalopathy: Secondary | ICD-10-CM | POA: Diagnosis present

## 2018-11-22 DIAGNOSIS — E785 Hyperlipidemia, unspecified: Secondary | ICD-10-CM | POA: Diagnosis present

## 2018-11-22 DIAGNOSIS — R410 Disorientation, unspecified: Secondary | ICD-10-CM | POA: Diagnosis present

## 2018-11-22 DIAGNOSIS — F0390 Unspecified dementia without behavioral disturbance: Secondary | ICD-10-CM | POA: Diagnosis present

## 2018-11-22 DIAGNOSIS — Z79899 Other long term (current) drug therapy: Secondary | ICD-10-CM | POA: Diagnosis not present

## 2018-11-22 DIAGNOSIS — Z7982 Long term (current) use of aspirin: Secondary | ICD-10-CM | POA: Diagnosis not present

## 2018-11-22 DIAGNOSIS — T43215A Adverse effect of selective serotonin and norepinephrine reuptake inhibitors, initial encounter: Secondary | ICD-10-CM | POA: Diagnosis present

## 2018-11-22 DIAGNOSIS — T443X5A Adverse effect of other parasympatholytics [anticholinergics and antimuscarinics] and spasmolytics, initial encounter: Secondary | ICD-10-CM | POA: Diagnosis present

## 2018-11-22 DIAGNOSIS — E119 Type 2 diabetes mellitus without complications: Secondary | ICD-10-CM | POA: Diagnosis present

## 2018-11-22 DIAGNOSIS — I10 Essential (primary) hypertension: Secondary | ICD-10-CM | POA: Diagnosis present

## 2018-11-22 DIAGNOSIS — R400 Somnolence: Secondary | ICD-10-CM | POA: Diagnosis present

## 2018-11-22 LAB — CBC WITH DIFFERENTIAL/PLATELET
Abs Immature Granulocytes: 0.03 10*3/uL (ref 0.00–0.07)
Basophils Absolute: 0 10*3/uL (ref 0.0–0.1)
Basophils Relative: 0 %
Eosinophils Absolute: 0 10*3/uL (ref 0.0–0.5)
Eosinophils Relative: 0 %
HCT: 43.4 % (ref 36.0–46.0)
Hemoglobin: 12.8 g/dL (ref 12.0–15.0)
Immature Granulocytes: 1 %
Lymphocytes Relative: 17 %
Lymphs Abs: 1.1 10*3/uL (ref 0.7–4.0)
MCH: 27.5 pg (ref 26.0–34.0)
MCHC: 29.5 g/dL — ABNORMAL LOW (ref 30.0–36.0)
MCV: 93.3 fL (ref 80.0–100.0)
Monocytes Absolute: 0.4 10*3/uL (ref 0.1–1.0)
Monocytes Relative: 7 %
Neutro Abs: 4.8 10*3/uL (ref 1.7–7.7)
Neutrophils Relative %: 75 %
Platelets: 187 10*3/uL (ref 150–400)
RBC: 4.65 MIL/uL (ref 3.87–5.11)
RDW: 13.3 % (ref 11.5–15.5)
WBC: 6.4 10*3/uL (ref 4.0–10.5)
nRBC: 0 % (ref 0.0–0.2)

## 2018-11-22 LAB — GLUCOSE, CAPILLARY
Glucose-Capillary: 57 mg/dL — ABNORMAL LOW (ref 70–99)
Glucose-Capillary: 65 mg/dL — ABNORMAL LOW (ref 70–99)
Glucose-Capillary: 81 mg/dL (ref 70–99)
Glucose-Capillary: 64 mg/dL — ABNORMAL LOW (ref 70–99)
Glucose-Capillary: 97 mg/dL (ref 70–99)

## 2018-11-22 LAB — COMPREHENSIVE METABOLIC PANEL
ALT: 59 U/L — ABNORMAL HIGH (ref 0–44)
AST: 125 U/L — ABNORMAL HIGH (ref 15–41)
Albumin: 4.3 g/dL (ref 3.5–5.0)
Alkaline Phosphatase: 104 U/L (ref 38–126)
Anion gap: 10 (ref 5–15)
BUN: 19 mg/dL (ref 8–23)
CO2: 27 mmol/L (ref 22–32)
Calcium: 10 mg/dL (ref 8.9–10.3)
Chloride: 109 mmol/L (ref 98–111)
Creatinine, Ser: 1.02 mg/dL — ABNORMAL HIGH (ref 0.44–1.00)
GFR calc Af Amer: >60 mL/min (ref >60)
GFR calc non Af Amer: 57 mL/min — ABNORMAL LOW (ref >60)
Glucose, Bld: 94 mg/dL (ref 70–99)
Potassium: 3.9 mmol/L (ref 3.5–5.1)
Sodium: 146 mmol/L — ABNORMAL HIGH (ref 135–145)
Total Bilirubin: 0.8 mg/dL (ref 0.3–1.2)
Total Protein: 7.3 g/dL (ref 6.5–8.1)

## 2018-11-22 LAB — MAGNESIUM: Magnesium: 2.4 mg/dL (ref 1.7–2.4)

## 2018-11-22 MED ORDER — SODIUM CHLORIDE 0.9 % IV SOLN
INTRAVENOUS | Status: AC
  Administered 2018-11-22 – 2018-11-23 (×3): via INTRAVENOUS

## 2018-11-22 NOTE — Progress Notes (Addendum)
Unable to complete admission questions due to patients mental status. Patient is incoherently talking to herself and extremely drowsy. Tele sitter in place. On assessment, periods of apnea noted while pt is sleeping.

## 2018-11-22 NOTE — Plan of Care (Signed)
  Problem: Education: Goal: Knowledge of General Education information will improve Description Including pain rating scale, medication(s)/side effects and non-pharmacologic comfort measures Outcome: Progressing   Problem: Health Behavior/Discharge Planning: Goal: Ability to manage health-related needs will improve Outcome: Progressing   Problem: Clinical Measurements: Goal: Ability to maintain clinical measurements within normal limits will improve Outcome: Progressing Goal: Will remain free from infection Outcome: Progressing Goal: Diagnostic test results will improve Outcome: Progressing Goal: Respiratory complications will improve Outcome: Progressing Goal: Cardiovascular complication will be avoided Outcome: Progressing   Problem: Activity: Goal: Risk for activity intolerance will decrease Outcome: Progressing   Problem: Nutrition: Goal: Adequate nutrition will be maintained Outcome: Progressing   Problem: Coping: Goal: Level of anxiety will decrease Outcome: Progressing   Problem: Elimination: Goal: Will not experience complications related to bowel motility Outcome: Progressing Goal: Will not experience complications related to urinary retention Outcome: Progressing   Problem: Pain Managment: Goal: General experience of comfort will improve Outcome: Progressing   Problem: Safety: Goal: Ability to remain free from injury will improve Outcome: Progressing   Problem: Skin Integrity: Goal: Risk for impaired skin integrity will decrease Outcome: Progressing Plan of care discussed with patient   

## 2018-11-22 NOTE — Evaluation (Signed)
Physical Therapy Evaluation Patient Details Name: Jenna Powell MRN: [id number] DOB: 08-04-1951 Today's Date: 11/22/2018   History of Present Illness  Pt from Assisted Living facility at [facility] per chart, and hom ehealth aide reported pt being more confused and weak than normal . I attempted several times to contact Holden Height to get PLOF and pt's baseline, however cannot get anyone to answer. So patient's baseline and what level she needs to be to return is still unknown at this time.   Clinical Impression  Pt appears to have generalized weakness ( noted in L side greater than right) however baseline is still unknown at this time. I attempted to call [facility] several times and was not able to get through or someone to pick up the phone. She ambulated small amount in her room with PT with RW and MinA , however unknown she level she was at baseline. Will continue to follow and recommend translator next session as well.     Follow Up Recommendations Home health PT    Equipment Recommendations  Other (comment)(unsure of her needs at this time )    Recommendations for Other Services       Precautions / Restrictions Precautions Precautions: None Restrictions Weight Bearing Restrictions: No      Mobility  Bed Mobility Overal bed mobility: Needs Assistance Bed Mobility: Supine to Sit;Sit to Supine     Supine to sit: Min assist Sit to supine: Mod assist   General bed mobility comments: pt used rails for supien to sit and cues, however with sit to supine asked for help with LES to get them on the bed.   Transfers Overall transfer level: Needs assistance Equipment used: Rolling walker (2 wheeled) Transfers: Sit to/from Stand Sit to Stand: Min assist;Min guard         General transfer comment: min guard to min A for transfers. did fairly well with rise and controleed decent as well . went to bathroom , to toilet and back to the bed.    Ambulation/Gait Ambulation/Gait assistance: Min guard;Min assist Gait Distance (Feet): 10 Feet(10 fee to bathroom and 10 feet back to bed ) Assistive device: Rolling walker (2 wheeled) Gait Pattern/deviations: Step-to pattern;Decreased stance time - left;Decreased step length - right     General Gait Details: pt seems to have defecit and weakness with LLE causing a more step to gait pattern favoring the LLE due to weankness. This could be her baseline and also difficulty with RW ( she may not use this at her baseline)   Financial trader Rankin (Stroke Patients Only)       Balance Overall balance assessment: Needs assistance;Mild deficits observed, not formally tested Sitting-balance support: No upper extremity supported;Feet supported Sitting balance-Leahy Scale: Good     Standing balance support: Bilateral upper extremity supported;During functional activity Standing balance-Leahy Scale: Fair                               Pertinent Vitals/Pain Pain Assessment: No/denies pain    Home Living Family/patient expects to be discharged to:: Assisted living                 Additional Comments: unknown at this time     Prior Function           Comments: unknown, pt  with some english, seems to  state she didn't use an AD, however chart also references a home health aide.      Hand Dominance        Extremity/Trunk Assessment        Lower Extremity Assessment Lower Extremity Assessment: Generalized weakness;LLE deficits/detail LLE Deficits / Details: noted L side UE and LE with more weakness ( quad, ankle and hand) and not WNL AROM for these movments when compared to R side.        Communication   Communication: Other (comment)(speaks and understands some English, but mostly speaks in [place] or [place] ( from [place]) )  Cognition Arousal/Alertness: Awake/alert Behavior During Therapy: WFL for tasks  assessed/performed Overall Cognitive Status: Within Functional Limits for tasks assessed                                 General Comments: followed general cues and commands      General Comments      Exercises     Assessment/Plan    PT Assessment Patient needs continued PT services  PT Problem List Decreased strength;Decreased activity tolerance;Decreased mobility       PT Treatment Interventions Gait training;Functional mobility training;Therapeutic activities;Therapeutic exercise;DME instruction    PT Goals (Current goals can be found in the Care Plan section)  Acute Rehab PT Goals Patient Stated Goal: unable to state PT Goal Formulation: Patient unable to participate in goal setting Time For Goal Achievement: 12/06/18 Potential to Achieve Goals: Good    Frequency Min 3X/week   Barriers to discharge   unknown baseline and support at this time . Tried calling [facility] and cannot get through.     Co-evaluation               AM-PAC PT "6 Clicks" Mobility  Outcome Measure Help needed turning from your back to your side while in a flat bed without using bedrails?: A Little Help needed moving from lying on your back to sitting on the side of a flat bed without using bedrails?: A Little Help needed moving to and from a bed to a chair (including a wheelchair)?: A Little Help needed standing up from a chair using your arms (e.g., wheelchair or bedside chair)?: A Little Help needed to walk in hospital room?: A Little Help needed climbing 3-5 steps with a railing? : A Lot 6 Click Score: 17    End of Session Equipment Utilized During Treatment: Gait belt Activity Tolerance: Patient tolerated treatment well Patient left: in bed;with bed alarm set;with family/visitor present Nurse Communication: Mobility status PT Visit Diagnosis: Other abnormalities of gait and mobility (R26.89);Muscle weakness (generalized) (M62.81)    Time: 1500-1540 PT Time  Calculation (min) (ACUTE ONLY): 40 min   Charges:   PT Evaluation $PT Eval Low Complexity: 1 Low PT Treatments $Therapeutic Activity: 8-22 mins        Clide Dales, PT Acute Rehabilitation Services Pager: [phone] Office: [phone] 11/22/2018   Clide Dales 11/22/2018, 6:04 PM

## 2018-11-22 NOTE — Progress Notes (Signed)
CBG 57.  Pt given juice.  Will recheck

## 2018-11-22 NOTE — Progress Notes (Signed)
REMS Clozapine program Update: 11/22/2018  Patient registered? Cannot confirm per REMS site: Accessed REMS site, attempted to enter Levasy 4800, not accepted and noted do not continue Clozapine. May call Clozapine REMS program at [phone] for more information   Per Clozapine REMS program: NOT acceptable to continue medication.  [place] monitoring required every week if Clozapine able to be continued after contacting REMS program. ANC value is acceptable, would be held for [place] < 1000, monitor ANC 3x per week if [place] Bradley  Minda Ditto PharmD Pager [phone] 11/22/2018, 9:03 AM

## 2018-11-22 NOTE — Progress Notes (Signed)
PROGRESS NOTE    Jenna Powell  QVZ:[id number] DOB: 05-Jul-1951 DOA: 11/21/2018 PCP: System, Pcp Not In    Brief Narrative:  Jenna Powell is a 68 y.o. female with hx of dementia, bipolar disorder, DMT2, HTN who presented from [place] by EMS for reported increased somnolence. History unable to obtained directly from [facility] despite attempts from ED and this provider to call the facility (voicemail left). Per EMS report, the facility was concerned for "UTI" given drowsiness noted by home health aide. No reported fevers or focal infectious symptoms. It appears that patient resides in ALF, thus suggesting that she is able to perform ADLs at baseline. Otherwise no clear symptoms reported by EMS or ED. Patient is unable to provide history on my exam. Of note, patient is on several psychotropic medications. Chart review reveals that patient has had prior encounters for AMS that were thought to be due to polypharmacy.  Assessment & Plan:   Principal Problem:   Somnolence Active Problems:   Dementia without behavioral disturbance (HCC)   Bipolar disorder (HCC)   DM (diabetes mellitus), type 2 (HCC)   Essential hypertension  Somnolence and confusion.  Patient presenting from [facility] assisted living facility for reported increased somnolence.  Work-up to include urinalysis, Tylenol, EtOH, salicylate level, lithium level, CT head, UDS all normal or unrevealing.  Lactic acid 1.2 with troponin less than 0.03.  Lithium level 0.36.  Unlikely infectious etiology.  Suspect polypharmacy with multiple sedating medications to include benztropine, clozapine, Risperdal, trazodone, lithium. --Holding home medications currently; likely will need to adjust prior to discharge --Mental status seems to be improved, possibly close to baseline --start diet --PT evaluation for discharge needs as from ALF  Reported hx of dementia and bipolar Home medications include benztropine 1 mg p.o. 3  times daily, clozapine 200mg  qAM and 250mg  qPM, donepezil 10 mg p.o. twice daily, Risperdal 4 mg p.o. twice daily, trazodone 25mg  at 4pm and 100mg  qHS, lithium 450 mg nightly. --We will continue to hold home anti-psychotics --Likely need to decrease doses prior to discharge  Hx of DMT2  Appears to be diet-controlled, no diabetic medications per home med list. --Continue to monitor CBG qAC/HS    DVT prophylaxis: Lovenox Code Status: Full code Family Communication: None Disposition Plan: Inpatient, hope for discharge back to ALF in 1-2 days pending PT evaluation   Consultants:   none  Procedures:   none  Antimicrobials:  none   Subjective: Patient seen and examined at bedside, mentation appears much improved since admission.  Likely back to her baseline.  Etiology likely polypharmacy.  Patient requesting something to eat this morning.  No other complaints.  Denies headache, no fever/chills/night sweats, no nausea/vomiting/diarrhea, no chest pain, no palpitations, no shortness of breath, no abdominal pain, no cough/congestion, no issues with bowel/bladder function, no paresthesias.  No acute events overnight per nursing staff.  Objective: Vitals:   11/21/18 2330 11/22/18 0012 11/22/18 0535 11/22/18 1354  BP: 111/76 137/80 135/76 128/70  Pulse: 77 80 90 80  Resp: 11 18 18 18   Temp:  98.3 F (36.8 C) 98.2 F (36.8 C) 98.7 F (37.1 C)  TempSrc:  Oral Oral Oral  SpO2: 97% 100% 100% 98%  Weight:      Height:        Intake/Output Summary (Last 24 hours) at 11/22/2018 1519 Last data filed at 11/22/2018 1400 Gross per 24 hour  Intake 1316.71 ml  Output 250 ml  Net 1066.71 ml   [employer]  11/21/18 1645  Weight: 90.7 kg    Examination:  General exam: Appears calm and comfortable  Respiratory system: Clear to auscultation. Respiratory effort normal. Cardiovascular system: S1 & S2 heard, RRR. No JVD, murmurs, rubs, gallops or clicks. No pedal edema.  Gastrointestinal system: Abdomen is nondistended, soft and nontender. No organomegaly or masses felt. Normal bowel sounds heard. Central nervous system: Alert and oriented. No focal neurological deficits. Extremities: Symmetric 5 x 5 power. Skin: No rashes, lesions or ulcers Psychiatry: Judgement and insight appear normal. Mood & affect appropriate.     Data Reviewed: I have personally reviewed following labs and imaging studies  CBC: Recent Labs  Lab 11/21/18 1740 11/22/18 0056  WBC 7.3 6.4  NEUTROABS 5.6 4.8  HGB 12.7 12.8  HCT 42.6 43.4  MCV 92.2 93.3  PLT 216 094   Basic Metabolic Panel: Recent Labs  Lab 11/21/18 1740 11/22/18 0056  NA 145 146*  K 3.9 3.9  CL 110 109  CO2 26 27  GLUCOSE 100* 94  BUN 18 19  CREATININE 0.95 1.02*  CALCIUM 9.9 10.0  MG  --  2.4   GFR: Estimated Creatinine Clearance: 55.7 mL/min (A) (by C-G formula based on SCr of 1.02 mg/dL (H)). Liver Function Tests: Recent Labs  Lab 11/21/18 1740 11/22/18 0056  AST 115* 125*  ALT 53* 59*  ALKPHOS 97 104  BILITOT 0.7 0.8  PROT 6.8 7.3  ALBUMIN 4.1 4.3   Recent Labs  Lab 11/21/18 1740  LIPASE 26   Recent Labs  Lab 11/21/18 2026  AMMONIA 16   Coagulation Profile: No results for input(s): INR, PROTIME in the last 168 hours. Cardiac Enzymes: Recent Labs  Lab 11/21/18 1740  TROPONINI <0.03   BNP (last 3 results) No results for input(s): PROBNP in the last 8760 hours. HbA1C: No results for input(s): HGBA1C in the last 72 hours. CBG: Recent Labs  Lab 11/22/18 1149 11/22/18 1208  GLUCAP 57* 65*   Lipid Profile: No results for input(s): CHOL, HDL, LDLCALC, TRIG, CHOLHDL, LDLDIRECT in the last 72 hours. Thyroid Function Tests: No results for input(s): TSH, T4TOTAL, FREET4, T3FREE, THYROIDAB in the last 72 hours. Anemia Panel: No results for input(s): VITAMINB12, FOLATE, FERRITIN, TIBC, IRON, RETICCTPCT in the last 72 hours. Sepsis Labs: Recent Labs  Lab 11/21/18 1741  11/21/18 1931  LATICACIDVEN 1.2 1.0    Recent Results (from the past 240 hour(s))  Culture, blood (routine x 2)     Status: None (Preliminary result)   Collection Time: 11/21/18  5:41 PM  Result Value Ref Range Status   Specimen Description   Final    BLOOD RIGHT FOREARM Performed at [facility], [place] [address] [place], [place] [address]    Special Requests   Final    BOTTLES DRAWN AEROBIC AND ANAEROBIC Blood Culture adequate volume Performed at [facility] [place] [address], [place], [place] [address]    Culture   Final    NO GROWTH < 24 HOURS Performed at [facility] [address], [place], [place] [address]    Report Status PENDING  Incomplete  Culture, blood (Routine X 2) w Reflex to ID Panel     Status: None (Preliminary result)   Collection Time: 11/21/18  5:58 PM  Result Value Ref Range Status   Specimen Description   Final    BLOOD LEFT HAND Performed at [facility], [place] [address], [place], [place] [address]    Special Requests  Final    BOTTLES DRAWN AEROBIC ONLY Blood Culture results may not be optimal due to an inadequate volume of blood received in culture bottles Performed at [facility], [place] [address], [place], [place] [address]    Culture   Final    NO GROWTH < 24 HOURS Performed at [place] [address], [place], [place] [address]    Report Status PENDING  Incomplete         Radiology Studies: Ct Head Wo Contrast  Result Date: 11/21/2018 CLINICAL DATA:  Altered mental status EXAM: CT HEAD WITHOUT CONTRAST TECHNIQUE: Contiguous axial images were obtained from the base of the skull through the vertex without intravenous contrast. COMPARISON:  Head CT 10/22/2016 FINDINGS: Brain: There is no mass, hemorrhage or extra-axial collection. The size and configuration of the ventricles and extra-axial CSF spaces are normal. The brain parenchyma is normal, without acute or chronic  infarction. Vascular: No abnormal hyperdensity of the major intracranial arteries or dural venous sinuses. No intracranial atherosclerosis. Skull: The visualized skull base, calvarium and extracranial soft tissues are normal. Sinuses/Orbits: No fluid levels or advanced mucosal thickening of the visualized paranasal sinuses. No mastoid or middle ear effusion. The orbits are normal. IMPRESSION: Normal head CT. Electronically Signed   By: Ulyses Jarred M.D.   On: 11/21/2018 19:01        Scheduled Meds: . enoxaparin (LOVENOX) injection  40 mg Subcutaneous Q24H   Continuous Infusions: . sodium chloride 100 mL/hr at 11/22/18 1145     LOS: 0 days    Time spent: 26 minutes    Doye Montilla J [place] DO [facility] Pager [phone]  If 7PM-7AM, please contact night-coverage [email or web address] Password [facility] 11/22/2018, 3:19 PM

## 2018-11-23 ENCOUNTER — Other Ambulatory Visit: Payer: Self-pay

## 2018-11-23 DIAGNOSIS — Z79899 Other long term (current) drug therapy: Secondary | ICD-10-CM

## 2018-11-23 LAB — GLUCOSE, CAPILLARY
Glucose-Capillary: 93 mg/dL (ref 70–99)
Glucose-Capillary: 158 mg/dL — ABNORMAL HIGH (ref 70–99)

## 2018-11-23 LAB — CBC WITH DIFFERENTIAL/PLATELET
Abs Immature Granulocytes: 0.02 10*3/uL (ref 0.00–0.07)
Basophils Absolute: 0 10*3/uL (ref 0.0–0.1)
Basophils Relative: 0 %
Eosinophils Absolute: 0 10*3/uL (ref 0.0–0.5)
Eosinophils Relative: 1 %
HCT: 43.9 % (ref 36.0–46.0)
Hemoglobin: 13 g/dL (ref 12.0–15.0)
Immature Granulocytes: 0 %
Lymphocytes Relative: 29 %
Lymphs Abs: 1.5 10*3/uL (ref 0.7–4.0)
MCH: 27.5 pg (ref 26.0–34.0)
MCHC: 29.6 g/dL — ABNORMAL LOW (ref 30.0–36.0)
MCV: 93 fL (ref 80.0–100.0)
Monocytes Absolute: 0.4 10*3/uL (ref 0.1–1.0)
Monocytes Relative: 7 %
Neutro Abs: 3.3 10*3/uL (ref 1.7–7.7)
Neutrophils Relative %: 63 %
Platelets: 178 10*3/uL (ref 150–400)
RBC: 4.72 MIL/uL (ref 3.87–5.11)
RDW: 13.1 % (ref 11.5–15.5)
WBC: 5.3 10*3/uL (ref 4.0–10.5)
nRBC: 0 % (ref 0.0–0.2)

## 2018-11-23 LAB — CLOZAPINE (CLOZARIL)
Clozapine Lvl: 308 ng/mL — ABNORMAL LOW (ref 350–650)
NorClozapine: 103 ng/mL
Total(Cloz+Norcloz): 411 ng/mL

## 2018-11-23 LAB — MAGNESIUM: Magnesium: 2.2 mg/dL (ref 1.7–2.4)

## 2018-11-23 MED ORDER — BENZTROPINE MESYLATE 1 MG PO TABS: 1.0000 mg | ORAL_TABLET | Freq: Two times a day (BID) | ORAL | 0 refills | Status: AC

## 2018-11-23 MED ORDER — TRAZODONE HCL 100 MG PO TABS: 50.0000 mg | ORAL_TABLET | Freq: Every day | ORAL | 0 refills | Status: DC

## 2018-11-23 MED ORDER — RISPERIDONE 4 MG PO TABS: 2.0000 mg | ORAL_TABLET | Freq: Two times a day (BID) | ORAL | 0 refills | Status: DC

## 2018-11-23 NOTE — Progress Notes (Signed)
CSW reached out to the facility 2x. CSW informed by ALF secretary the medical director will call this CSW back and assist with the patient.  CSW notified the patient son Jenna Powell. He has tried to reach out to the facility and unable to reach anyone.   CSW will continue to call.   Kathrin Greathouse, Marlinda Mike, MSW Clinical Social Worker  [phone] 11/23/2018  11:24 AM

## 2018-11-23 NOTE — Plan of Care (Signed)
  Problem: Education: Goal: Knowledge of General Education information will improve Description Including pain rating scale, medication(s)/side effects and non-pharmacologic comfort measures Outcome: Adequate for Discharge   Problem: Health Behavior/Discharge Planning: Goal: Ability to manage health-related needs will improve Outcome: Adequate for Discharge   Problem: Clinical Measurements: Goal: Ability to maintain clinical measurements within normal limits will improve Outcome: Adequate for Discharge Goal: Will remain free from infection Outcome: Adequate for Discharge Goal: Diagnostic test results will improve Outcome: Adequate for Discharge Goal: Respiratory complications will improve Outcome: Adequate for Discharge   Problem: Activity: Goal: Risk for activity intolerance will decrease Outcome: Adequate for Discharge   Problem: Nutrition: Goal: Adequate nutrition will be maintained Outcome: Adequate for Discharge   Problem: Coping: Goal: Level of anxiety will decrease Outcome: Adequate for Discharge   Problem: Elimination: Goal: Will not experience complications related to bowel motility Outcome: Adequate for Discharge   Problem: Safety: Goal: Ability to remain free from injury will improve Outcome: Adequate for Discharge   Problem: Skin Integrity: Goal: Risk for impaired skin integrity will decrease Outcome: Adequate for Discharge

## 2018-11-23 NOTE — Evaluation (Signed)
Occupational Therapy Evaluation Patient Details Name: Jenna Powell MRN: [id number] DOB: Jan 19, 1951 Today's Date: 11/23/2018    History of Present Illness Pt from Assisted Living facility at [facility] per chart, and hom ehealth aide reported pt being more confused and weak than normal . I attempted several times to contact Holden Height to get PLOF and pt's baseline, however cannot get anyone to answer. So patient's baseline and what level she needs to be to return is still unknown at this time.    Clinical Impression   Pt admitted with weakness. Pt currently with functional limitations due to the deficits listed below (see OT Problem List).  Pt will benefit from skilled OT to increase their safety and independence with ADL and functional mobility for ADL to facilitate discharge to venue listed below.   Pt will need min A with ADL activity as well as OT at ALF to regain PLOF     Follow Up Recommendations  Supervision/Assistance - 24 hour;Other (comment)(OT at ALF)    Equipment Recommendations  None recommended by OT    Recommendations for Other Services       Precautions / Restrictions Precautions Precautions: Fall      Mobility Bed Mobility Overal bed mobility: Needs Assistance Bed Mobility: Supine to Sit;Sit to Supine     Supine to sit: Min assist Sit to supine: Min assist      Transfers Overall transfer level: Needs assistance Equipment used: Rolling walker (2 wheeled) Transfers: Sit to/from [facility] Sit to Stand: Min assist Stand pivot transfers: Min assist            Balance Overall balance assessment: Needs assistance;Mild deficits observed, not formally tested Sitting-balance support: No upper extremity supported;Feet supported Sitting balance-Leahy Scale: Good     Standing balance support: Bilateral upper extremity supported;During functional activity Standing balance-Leahy Scale: Fair                              ADL either performed or assessed with clinical judgement   ADL Overall ADL's : Needs assistance/impaired Eating/Feeding: Set up;Sitting   Grooming: Set up;Sitting   Upper Body Bathing: Set up;Sitting   Lower Body Bathing: Moderate assistance;Sit to/from stand;Cueing for sequencing;Cueing for safety   Upper Body Dressing : Set up;Sitting   Lower Body Dressing: Moderate assistance;Sit to/from stand;Cueing for sequencing;Cueing for safety   Toilet Transfer: Minimal assistance;+2 for safety/equipment;BSC;Stand-pivot   Toileting- Clothing Manipulation and Hygiene: Moderate assistance;Cueing for sequencing;Cueing for safety               Vision Patient Visual Report: No change from baseline              Pertinent Vitals/Pain Pain Assessment: No/denies pain     Hand Dominance     Extremity/Trunk Assessment Upper Extremity Assessment Upper Extremity Assessment: Generalized weakness           Communication Communication Communication: Other (comment)(speaks and understands some [place], but mostly speaks in [place] or [place] ( from [place]) )   Cognition Arousal/Alertness: Awake/alert Behavior During Therapy: WFL for tasks assessed/performed Overall Cognitive Status: Within Functional Limits for tasks assessed                                 General Comments: followed general cues and commands   General Comments  Home Living Family/patient expects to be discharged to:: Assisted living                                 Additional Comments: unknown at this time       Prior Functioning/Environment          Comments: unknown, pt  with some english, seems to state she didn't use an AD, however chart also references a home health aide.         OT Problem List: Decreased strength;Decreased activity tolerance;Impaired balance (sitting and/or standing)      OT Treatment/Interventions:      OT Goals(Current goals  can be found in the care plan section) Acute Rehab OT Goals Patient Stated Goal: go to bathroom OT Goal Formulation: With patient Time For Goal Achievement: 11/23/18 Potential to Achieve Goals: Good  OT Frequency:      AM-PAC OT "6 Clicks" Daily Activity     Outcome Measure Help from another person eating meals?: None Help from another person taking care of personal grooming?: A Little Help from another person toileting, which includes using toliet, bedpan, or urinal?: A Little Help from another person bathing (including washing, rinsing, drying)?: A Little Help from another person to put on and taking off regular upper body clothing?: A Little Help from another person to put on and taking off regular lower body clothing?: A Little 6 Click Score: 19   End of Session Equipment Utilized During Treatment: Gait belt;Rolling walker Nurse Communication: Mobility status  Activity Tolerance: Patient tolerated treatment well Patient left: in bed  OT Visit Diagnosis: Unsteadiness on feet (R26.81);Other abnormalities of gait and mobility (R26.89)                Time: 6720-9470 OT Time Calculation (min): 22 min Charges:  OT General Charges $OT Visit: 1 Visit OT Evaluation $OT Eval Moderate Complexity: 1 Mod  Kari Baars, [place] [phone] Office- [phone]     Angila Wombles, Edwena Felty D 11/23/2018, 2:11 PM

## 2018-11-23 NOTE — Discharge Summary (Signed)
Physician Discharge Summary  Jenna Powell EHO:[id number] DOB: [birth year] DOA: 11/21/2018  PCP: System, Pcp Not In  Admit date: 11/21/2018 Discharge date: 11/23/2018  Admitted From: [facility] Disposition: [facility]  Recommendations for Outpatient Follow-up:  1. Follow up with PCP in 1 week 2. Stopped cloazepine 3. Decrease risperidone from 4 mg to 2 mg p.o. twice daily 4. Decreased trazodone from 100 mg to 50 mg nightly 5. Decrease benztropine from 1 mg p.o. 3 times daily to twice daily  Home Health: Yes, RN, PT, aide Equipment/Devices: None  Discharge Condition: Stable CODE STATUS: Full code Diet recommendation: Heart Healthy   History of present illness:  Jenna Powell a 68 y.o.femalewithhx of dementia, bipolar disorder, DMT2, HTN who presented from [place] by EMS for reported increased somnolence.History unable to obtained directly from [name] despite attempts from ED and this provider to call the facility (voicemail left). Per EMS report, the facility was concerned for "UTI" given drowsiness noted by home health aide. No reported fevers or focal infectious symptoms. It appears that patient resides in ALF, thus suggesting that she is able to perform ADLs at baseline. Otherwise no clear symptoms reported by EMS or ED. Patient is unable to provide history on my exam. Of note, patient is on several psychotropic medications. Chart review reveals that patient has had prior encounters for AMS that were thought to be due to polypharmacy.  Hospital course:  Acute metabolic encephalopathy Somnolence and confusion.  Patient presenting from [facility] assisted living facility for reported increased somnolence.  Work-up to include urinalysis, Tylenol, EtOH, salicylate level, lithium level, CT head, UDS all normal or unrevealing.  Lactic acid 1.2 with troponin less than 0.03.  Lithium level 0.36.  Unlikely infectious etiology.  Suspect polypharmacy  with multiple sedating medications to include benztropine, clozapine, Risperdal, trazodone, lithium.  Initially her home medications were held with improvement of her mental status back to baseline.  Physical therapy evaluated patient with recommendations of home health PT.  Will discharge back home with decreased dose of medication changes listed above.  Reported hx of dementia and bipolar Home medications include benztropine 1 mg p.o. 3 times daily, clozapine 200mg  qAM and 250mg  qPM, donepezil 10 mg p.o. twice daily, Risperdal 4 mg p.o. twice daily, trazodone 25mg  at 4pm and 100mg  qHS, lithium 450 mg nightly.  These medications were adjusted as above.  Hx of DMT2  Diet controlled.  Discharge Diagnoses:  Active Problems:   Dementia without behavioral disturbance (HCC)   Bipolar disorder (HCC)   DM (diabetes mellitus), type 2 ([place])   Essential hypertension   Polypharmacy    Discharge Instructions  Discharge Instructions    Call MD for:  difficulty breathing, headache or visual disturbances   Complete by:  As directed    Call MD for:  persistant dizziness or light-headedness   Complete by:  As directed    Call MD for:  persistant nausea and vomiting   Complete by:  As directed    Call MD for:  severe uncontrolled pain   Complete by:  As directed    Call MD for:  temperature >100.4   Complete by:  As directed    Diet - low sodium heart healthy   Complete by:  As directed    Increase activity slowly   Complete by:  As directed      Allergies as of 11/23/2018   No Known Allergies     Medication List    STOP taking  these medications   clozapine 200 MG tablet Commonly known as:  CLOZARIL   cloZAPine 25 MG tablet Commonly known as:  CLOZARIL     TAKE these medications   aspirin 81 MG EC tablet Take 81 mg by mouth daily. Swallow whole.   benztropine 1 MG tablet Commonly known as:  COGENTIN Take 1 tablet (1 mg total) by mouth 2 (two) times daily. What changed:  when  to take this   Calcium 600+D 600-400 MG-UNIT tablet Generic drug:  Calcium Carbonate-Vitamin D Take 1 tablet by mouth every 12 (twelve) hours.   Docusate Sodium 100 MG capsule Take 100 mg by mouth 3 (three) times daily.   donepezil 10 MG tablet Commonly known as:  ARICEPT Take 10 mg 2 (two) times daily by mouth.   FIBER LAXATIVE PO Take 1 tablet by mouth 2 (two) times daily.   lactulose 10 GM/15ML solution Commonly known as:  CHRONULAC Take 20 g by mouth daily.   LIQUID TEARS OP Place 1 drop 3 (three) times daily into both eyes.   lithium carbonate 300 MG CR tablet Commonly known as:  LITHOBID Take 450 mg by mouth at bedtime.   Myrbetriq 50 MG Tb24 tablet Generic drug:  mirabegron ER Take 50 mg by mouth daily.   polycarbophil 625 MG tablet Commonly known as:  FIBERCON Take 625 mg 2 (two) times daily by mouth.   pravastatin 20 MG tablet Commonly known as:  PRAVACHOL Take 20 mg by mouth every evening.   risperidone 4 MG tablet Commonly known as:  RISPERDAL Take 0.5 tablets (2 mg total) by mouth 2 (two) times daily. What changed:  how much to take   senna 8.6 MG Tabs tablet Commonly known as:  SENOKOT Take 2 tablets at bedtime by mouth.   sertraline 50 MG tablet Commonly known as:  ZOLOFT Take 50 mg by mouth daily.   traZODone 100 MG tablet Commonly known as:  DESYREL Take 0.5 tablets (50 mg total) by mouth at bedtime. What changed:    how much to take  Another medication with the same name was removed. Continue taking this medication, and follow the directions you see here.      Follow-up Information    PCP. Schedule an appointment as soon as possible for a visit in 1 week(s).          No Known Allergies  Consultations:  none   Procedures/Studies: Ct Head Wo Contrast  Result Date: 11/21/2018 CLINICAL DATA:  Altered mental status EXAM: CT HEAD WITHOUT CONTRAST TECHNIQUE: Contiguous axial images were obtained from the base of the skull  through the vertex without intravenous contrast. COMPARISON:  Head CT 10/22/2016 FINDINGS: Brain: There is no mass, hemorrhage or extra-axial collection. The size and configuration of the ventricles and extra-axial CSF spaces are normal. The brain parenchyma is normal, without acute or chronic infarction. Vascular: No abnormal hyperdensity of the major intracranial arteries or dural venous sinuses. No intracranial atherosclerosis. Skull: The visualized skull base, calvarium and extracranial soft tissues are normal. Sinuses/Orbits: No fluid levels or advanced mucosal thickening of the visualized paranasal sinuses. No mastoid or middle ear effusion. The orbits are normal. IMPRESSION: Normal head CT. Electronically Signed   By: Ulyses Jarred M.D.   On: 11/21/2018 19:01       Subjective: Patient seen and examined at bedside, resting comfortably.  Eating breakfast.  No complaints.  Ready for discharge back home.  Discussed with her we will reduce some of her medicines as  likely cause of her presentation secondary to polypharmacy.  Denies headache, no fever/chills/night sweats, no chest pain, no palpitations, no shortness of breath, no abdominal pain, no weakness, no issues with bowel/bladder function.  No acute events overnight per nursing staff.   Discharge Exam: Vitals:   11/22/18 2128 11/23/18 0531  BP: 133/71 131/73  Pulse: 84 76  Resp: 16 18  Temp: 99.4 F (37.4 C) 98.1 F (36.7 C)  SpO2: 99% 99%   Vitals:   11/22/18 1354 11/22/18 2100 11/22/18 2128 11/23/18 0531  BP: 128/70  133/71 131/73  Pulse: 80 86 84 76  Resp: 18 16 16 18   Temp: 98.7 F (37.1 C)  99.4 F (37.4 C) 98.1 F (36.7 C)  TempSrc: Oral  Oral Oral  SpO2: 98%  99% 99%  Weight:      Height:        General: Pt is alert, awake, not in acute distress Cardiovascular: RRR, S1/S2 +, no rubs, no gallops Respiratory: CTA bilaterally, no wheezing, no rhonchi Abdominal: Soft, NT, ND, bowel sounds + Extremities: no edema, no  cyanosis    The results of significant diagnostics from this hospitalization (including imaging, microbiology, ancillary and laboratory) are listed below for reference.     Microbiology: Recent Results (from the past 240 hour(s))  Urine culture     Status: None (Preliminary result)   Collection Time: 11/21/18  5:08 PM  Result Value Ref Range Status   Specimen Description   Final    URINE, RANDOM Performed at [facility] [address], [place], [place] [address]    Special Requests   Final    NONE Performed at [facility], [place] [address], [place], [place] [address]    Culture   Final    NO GROWTH Performed at [facility], [place] [address] [place], [place] [address]    Report Status PENDING  Incomplete  Culture, blood (routine x 2)     Status: None (Preliminary result)   Collection Time: 11/21/18  5:41 PM  Result Value Ref Range Status   Specimen Description   Final    BLOOD RIGHT FOREARM Performed at [facility], [place] [address], [place], [place] [address]    Special Requests   Final    BOTTLES DRAWN AEROBIC AND ANAEROBIC Blood Culture adequate volume Performed at [facility] [address], [place], [place] [address]    Culture   Final    NO GROWTH < 24 HOURS Performed at [facility] [address] [place], [place] [address]    Report Status PENDING  Incomplete  Culture, blood (Routine X 2) w Reflex to ID Panel     Status: None (Preliminary result)   Collection Time: 11/21/18  5:58 PM  Result Value Ref Range Status   Specimen Description   Final    BLOOD LEFT HAND Performed at [facility], [place] [address] [place], [place] [address]    Special Requests   Final    BOTTLES DRAWN AEROBIC ONLY Blood Culture results may not be optimal due to an inadequate volume of blood received in culture bottles Performed at [facility] [address] [place], [place]  [address]    Culture   Final    NO GROWTH < 24 HOURS Performed at [facility] [address], [place],  [address]    Report Status PENDING  Incomplete     Labs:  BNP (last 3 results) No results for input(s): BNP in the last 8760 hours. Basic Metabolic Panel: Recent Labs  Lab 11/21/18 1740 11/22/18 0056 11/23/18 0414  NA 145 146*  --   K 3.9 3.9  --   CL 110 109  --   CO2 26 27  --   GLUCOSE 100* 94  --   BUN 18 19  --   CREATININE 0.95 1.02*  --   CALCIUM 9.9 10.0  --   MG  --  2.4 2.2   Liver Function Tests: Recent Labs  Lab 11/21/18 1740 11/22/18 0056  AST 115* 125*  ALT 53* 59*  ALKPHOS 97 104  BILITOT 0.7 0.8  PROT 6.8 7.3  ALBUMIN 4.1 4.3   Recent Labs  Lab 11/21/18 1740  LIPASE 26   Recent Labs  Lab 11/21/18 2026  AMMONIA 16   CBC: Recent Labs  Lab 11/21/18 1740 11/22/18 0056 11/23/18 0414  WBC 7.3 6.4 5.3  NEUTROABS 5.6 4.8 3.3  HGB 12.7 12.8 13.0  HCT 42.6 43.4 43.9  MCV 92.2 93.3 93.0  PLT 216 187 178   Cardiac Enzymes: Recent Labs  Lab 11/21/18 1740  TROPONINI <0.03   BNP: Invalid input(s): POCBNP CBG: Recent Labs  Lab 11/22/18 1208 11/22/18 1627 11/22/18 1648 11/22/18 2127 11/23/18 0731  GLUCAP 65* 81 64* 97 93   D-Dimer No results for input(s): DDIMER in the last 72 hours. Hgb A1c No results for input(s): HGBA1C in the last 72 hours. Lipid Profile No results for input(s): CHOL, HDL, LDLCALC, TRIG, CHOLHDL, LDLDIRECT in the last 72 hours. Thyroid function studies No results for input(s): TSH, T4TOTAL, T3FREE, THYROIDAB in the last 72 hours.  Invalid input(s): FREET3 Anemia work up No results for input(s): VITAMINB12, FOLATE, FERRITIN, TIBC, IRON, RETICCTPCT in the last 72 hours. Urinalysis    Component Value Date/Time   COLORURINE YELLOW 11/21/2018 1659   APPEARANCEUR CLEAR 11/21/2018 1659   LABSPEC 1.021 11/21/2018 1659   PHURINE 5.0 11/21/2018 1659   GLUCOSEU NEGATIVE 11/21/2018 1659   HGBUR  NEGATIVE 11/21/2018 [place] 11/21/2018 1659   KETONESUR 20 (A) 11/21/2018 1659   PROTEINUR NEGATIVE 11/21/2018 1659   NITRITE NEGATIVE 11/21/2018 1659   LEUKOCYTESUR NEGATIVE 11/21/2018 1659   Sepsis Labs Invalid input(s): PROCALCITONIN,  WBC,  LACTICIDVEN Microbiology Recent Results (from the past 240 hour(s))  Urine culture     Status: None (Preliminary result)   Collection Time: 11/21/18  5:08 PM  Result Value Ref Range Status   Specimen Description   Final    URINE, RANDOM Performed at [facility], [place] [address], [place], [place] [address]    Special Requests   Final    NONE Performed at [facility], [place] [address] [place] [address], [place], [place] [address]    Culture   Final    NO GROWTH Performed at [facility], [place] [address], [place], [place] [address]    Report Status PENDING  Incomplete  Culture, blood (routine x 2)     Status: None (Preliminary result)   Collection Time: 11/21/18  5:41 PM  Result Value Ref Range Status   Specimen Description   Final    BLOOD RIGHT FOREARM Performed at [facility], [place] [address], [place], [place] [address]    Special Requests   Final    BOTTLES DRAWN AEROBIC AND ANAEROBIC Blood Culture adequate volume Performed at [place] [address] [place],  [place] [address]    Culture   Final    NO GROWTH < 24 HOURS Performed at [facility], [place] [address] [place], [place] [address]    Report Status PENDING  Incomplete  Culture, blood (Routine X 2) w Reflex to ID Panel     Status: None (Preliminary result)   Collection Time: 11/21/18  5:58 PM  Result Value Ref Range Status   Specimen Description   Final    BLOOD LEFT HAND Performed at [facility], [place] [address] [place], [place] [address]    Special Requests   Final    BOTTLES DRAWN AEROBIC ONLY Blood Culture results may not be optimal due to an inadequate volume of blood received  in culture bottles Performed at [facility] [address], [place], [place] [address]    Culture   Final    NO GROWTH < 24 HOURS Performed at [place] [address], [place], [place] [address]    Report Status PENDING  Incomplete     Time coordinating discharge: Over 30 minutes  SIGNED:   Eric J [place] DO  [facility] 11/23/2018, 10:13 AM

## 2018-11-23 NOTE — Plan of Care (Signed)
Plan of care reviewed. 

## 2018-11-23 NOTE — TOC Transition Note (Signed)
Transition of Care [facility]) - CM/SW Discharge Note   Patient Details  Name: Jenna Powell MRN: [id number] Date of Birth: 08/08/1950  [facility] CM/SW Contact:  Lia Hopping, [place] Phone Number: 11/23/2018, 1:22 PM   Clinical Narrative:    Patient returning to SNF.  Patient will continue therapy at ALF.  CSW faxed FL2 and discharge summary.  CSW notified patient son about discharge.   Final next level of care: [place]      Patient Goals and CMS Choice Patient states their goals for this hospitalization and ongoing recovery are:: Return       Discharge Placement                Patient to be transferred to facility by: [place]  Name of family member notified: Romie Minus  Patient and family notified of of transfer: 11/23/18  Discharge Plan and Services                         Social Determinants of Health (SDOH) Interventions     Readmission Risk Interventions No flowsheet data found.

## 2018-11-23 NOTE — NC FL2 (Addendum)
[place] LEVEL OF CARE SCREENING TOOL     IDENTIFICATION  Patient Name: Jenna Powell Birthdate: [birth year] Sex: female Admission Date (Current Location): 11/21/2018  [facility] and [place] Number:  Herbalist and Address:  [facility],  [place], [place]      Provider Number: [id number]  Attending Physician Name and Address:  [place] Eric J, DO  Relative Name and Phone Number:  Melayna, [place] [phone]     Current Level of Care: Hospital Recommended Level of Care: [place] Prior Approval Number:    Date Approved/Denied:   PASRR Number:    Discharge Plan: (ALF )    Current Diagnoses: Patient Active Problem List   Diagnosis Date Noted  . Polypharmacy 11/23/2018  . Dementia without behavioral disturbance ([place]) 11/22/2018  . Bipolar disorder ([place]) 11/22/2018  . DM (diabetes mellitus), type 2 ([place]) 11/22/2018  . Essential hypertension 11/22/2018    Orientation RESPIRATION BLADDER Height & Weight     Self  Normal Continent Weight: 200 lb (90.7 kg) Height:  5\' 1"  (154.9 cm)  BEHAVIORAL SYMPTOMS/MOOD NEUROLOGICAL BOWEL NUTRITION STATUS      Continent Diet(Low Sodium Heart Healthy )  AMBULATORY STATUS COMMUNICATION OF NEEDS Skin   Extensive Assist Verbally Normal                       Personal Care Assistance Level of Assistance  Bathing, Feeding, Dressing Bathing Assistance: Limited assistance Feeding assistance: Independent Dressing Assistance: Limited assistance     Functional Limitations Info  Sight, Speech, Hearing Sight Info: Adequate Hearing Info: Adequate Speech Info: Adequate    SPECIAL CARE FACTORS FREQUENCY  PT (By licensed PT), OT (By licensed OT)     PT Frequency: 5X/week OT Frequency: 5x/week            Contractures Contractures Info: Not present    Additional Factors Info  Code Status, Allergies, Psychotropic Code Status Info: Fullcode    Psychotropic  Info: See Medication list            Discharge Medications: Please see discharge summary for a list of discharge medications.  Allergies as of 11/23/2018   No Known Allergies        Medication List    STOP taking these medications   clozapine 200 MG tablet Commonly known as:  CLOZARIL   cloZAPine 25 MG tablet Commonly known as:  CLOZARIL     TAKE these medications   aspirin 81 MG EC tablet Take 81 mg by mouth daily. Swallow whole.   benztropine 1 MG tablet Commonly known as:  COGENTIN Take 1 tablet (1 mg total) by mouth 2 (two) times daily. What changed:  when to take this   Calcium 600+D 600-400 MG-UNIT tablet Generic drug:  Calcium Carbonate-Vitamin D Take 1 tablet by mouth every 12 (twelve) hours.   Docusate Sodium 100 MG capsule Take 100 mg by mouth 3 (three) times daily.   donepezil 10 MG tablet Commonly known as:  ARICEPT Take 10 mg 2 (two) times daily by mouth.   FIBER LAXATIVE PO Take 1 tablet by mouth 2 (two) times daily.   lactulose 10 GM/15ML solution Commonly known as:  CHRONULAC Take 20 g by mouth daily.   LIQUID TEARS OP Place 1 drop 3 (three) times daily into both eyes.   lithium carbonate 300 MG CR tablet Commonly known as:  LITHOBID Take 450 mg by mouth at bedtime.   Myrbetriq 50  MG Tb24 tablet Generic drug:  mirabegron ER Take 50 mg by mouth daily.   polycarbophil 625 MG tablet Commonly known as:  FIBERCON Take 625 mg 2 (two) times daily by mouth.   pravastatin 20 MG tablet Commonly known as:  PRAVACHOL Take 20 mg by mouth every evening.   risperidone 4 MG tablet Commonly known as:  RISPERDAL Take 0.5 tablets (2 mg total) by mouth 2 (two) times daily. What changed:  how much to take   senna 8.6 MG Tabs tablet Commonly known as:  SENOKOT Take 2 tablets at bedtime by mouth.   sertraline 50 MG tablet Commonly known as:  ZOLOFT Take 50 mg by mouth daily.   traZODone 100 MG tablet Commonly known as:   DESYREL Take 0.5 tablets (50 mg total) by mouth at bedtime. What changed:    how much to take  Another medication with the same name was removed. Continue taking this medication, and follow the directions you see here.         Follow-up Information    PCP. Schedule an appointment as soon as possible for a visit in 1 week(s).            Relevant Imaging Results:  Relevant Lab Results:   Additional Information ssn:[id number]  Lia Hopping, LCSW   Home Health/ Physical Therapy.

## 2018-11-25 LAB — URINE CULTURE: Culture: NO GROWTH

## 2018-11-26 LAB — CULTURE, BLOOD (ROUTINE X 2)
Culture: NO GROWTH
Culture: NO GROWTH
Special Requests: ADEQUATE

## 2019-04-14 ENCOUNTER — Other Ambulatory Visit: Payer: Self-pay

## 2019-04-14 ENCOUNTER — Emergency Department (HOSPITAL_COMMUNITY)
Admission: EM | Admit: 2019-04-14 | Discharge: 2019-04-14 | Disposition: A | Discharge status: Skilled Nursing Facility | Payer: Medicare Other | Attending: Emergency Medicine | Admitting: Emergency Medicine

## 2019-04-14 DIAGNOSIS — N812 Incomplete uterovaginal prolapse: Secondary | ICD-10-CM

## 2019-04-14 DIAGNOSIS — I10 Essential (primary) hypertension: Secondary | ICD-10-CM | POA: Diagnosis not present

## 2019-04-14 DIAGNOSIS — Z79899 Other long term (current) drug therapy: Secondary | ICD-10-CM | POA: Diagnosis not present

## 2019-04-14 DIAGNOSIS — E119 Type 2 diabetes mellitus without complications: Secondary | ICD-10-CM | POA: Insufficient documentation

## 2019-04-14 DIAGNOSIS — F0390 Unspecified dementia without behavioral disturbance: Secondary | ICD-10-CM | POA: Insufficient documentation

## 2019-04-14 DIAGNOSIS — Z7982 Long term (current) use of aspirin: Secondary | ICD-10-CM | POA: Diagnosis not present

## 2019-04-14 DIAGNOSIS — N898 Other specified noninflammatory disorders of vagina: Secondary | ICD-10-CM | POA: Diagnosis present

## 2019-04-14 NOTE — ED Notes (Signed)
Attempted to call report to staff at [facility], no answer.

## 2019-04-14 NOTE — ED Notes (Signed)
Report called to [facility], [place].

## 2019-04-14 NOTE — ED Triage Notes (Signed)
Per EMS: Pt from [facility] with possible prolapsed uterus.  Pt hx of dementia.  Pt was showering, and staff saw something hanging out when she bent over to pick something up.  Has been having light spotting, and for the past month pt has been saying "I am having a baby."

## 2019-04-14 NOTE — ED Provider Notes (Signed)
[facility] DEPT Provider Note   CSN: [id number] Arrival date & time: 04/14/19  1041     History   Chief Complaint No chief complaint on file.   HPI Jenna Powell is a 68 y.o. female.     HPI   Patient with dementia, was being showered this morning by staff when they noticed something protruding from her vagina.  They were worried about prolapse, so sent her here.  Patient has been previously evaluated by gynecology, note from 08/21/2015 revealed prolapsed cervix, which was otherwise normal.  At that time she had pelvic ultrasounds done, as partial work-up for possible postmenopausal vaginal bleeding.  It was felt then that she was not having vaginal bleeding. She cannot give history.  Level 5 Caveat- Dementia  Past Medical History:  Diagnosis Date  . Dementia ([place])   . Diabetes mellitus without complication ([place])   . Hyperlipidemia   . Hypertension     Patient Active Problem List   Diagnosis Date Noted  . Polypharmacy 11/23/2018  . Dementia without behavioral disturbance ([place]) 11/22/2018  . Bipolar disorder ([place]) 11/22/2018  . DM (diabetes mellitus), type 2 ([place]) 11/22/2018  . Essential hypertension 11/22/2018    No past surgical history on file.   OB History    Gravida  7   Para      Term      Preterm      AB      Living  7     SAB      TAB      Ectopic      Multiple      Live Births               Home Medications    Prior to Admission medications   Medication Sig Start Date End Date Taking? Authorizing Provider  acetaminophen (TYLENOL) 500 MG tablet Take 500 mg by mouth every 6 (six) hours as needed for fever.   Yes [provider]  aspirin 81 MG EC tablet Take 81 mg by mouth daily. Swallow whole.   Yes [provider]  benzocaine (BABY ORAJEL) 7.5 % oral gel Use as directed 1 application in the mouth or throat 3 (three) times daily as needed for pain.   Yes [provider]   benztropine (COGENTIN) 1 MG tablet Take 1 tablet (1 mg total) by mouth 2 (two) times daily. 11/23/18  Yes [place] Donnamarie Poag, DO  Calcium Carbonate-Vitamin D (CALCIUM 600+D) 600-400 MG-UNIT tablet Take 1 tablet by mouth every 12 (twelve) hours.   Yes [provider]  clozapine (CLOZARIL) 200 MG tablet Take 200 mg by mouth 2 (two) times daily.   Yes [provider]  Docusate Sodium 100 MG capsule Take 100 mg by mouth 3 (three) times daily.    Yes [provider]  donepezil (ARICEPT) 10 MG tablet Take 10 mg 2 (two) times daily by mouth.    Yes [provider]  loperamide (IMODIUM A-D) 2 MG tablet Take 2 mg by mouth as needed for diarrhea or loose stools.   Yes [provider]  Polyvinyl Alcohol (LIQUID TEARS OP) Place 1 drop 3 (three) times daily into both eyes.   Yes [provider]  Calcium Polycarbophil (FIBER LAXATIVE PO) Take 1 tablet by mouth 2 (two) times daily.    [provider]  lactulose (CHRONULAC) 10 GM/15ML solution Take 20 g by mouth daily.    [provider]  lithium carbonate (LITHOBID) 300  MG CR tablet Take 450 mg by mouth at bedtime.     [provider]  mirabegron ER (MYRBETRIQ) 50 MG TB24 tablet Take 50 mg by mouth daily.    [provider]  polycarbophil (FIBERCON) 625 MG tablet Take 625 mg 2 (two) times daily by mouth.    [provider]  pravastatin (PRAVACHOL) 20 MG tablet Take 20 mg by mouth every evening.     [provider]  risperidone (RISPERDAL) 4 MG tablet Take 0.5 tablets (2 mg total) by mouth 2 (two) times daily. 11/23/18   [place] Donnamarie Poag, DO  senna (SENOKOT) 8.6 MG TABS tablet Take 2 tablets at bedtime by mouth.    [provider]  sertraline (ZOLOFT) 50 MG tablet Take 50 mg by mouth daily.    [provider]  traZODone (DESYREL) 100 MG tablet Take 0.5 tablets (50 mg total) by mouth at bedtime. 11/23/18   [place] Eric J, DO    Family History  No family history on file.  Social History Social History   Tobacco Use  . Smoking status: Never Smoker  . Smokeless tobacco: Never Used  Substance Use Topics  . Alcohol use: No  . Drug use: Never     Allergies   Patient has no known allergies.   Review of Systems Review of Systems  Unable to perform ROS: Dementia     Physical Exam Updated Vital Signs BP 102/79 (BP Location: Right Arm)   Pulse 72   Temp 98.4 F (36.9 C) (Oral)   Resp 17   SpO2 100%   Physical Exam Vitals signs and nursing note reviewed.  Constitutional:      General: She is not in acute distress.    Appearance: She is well-developed. She is obese. She is not ill-appearing or diaphoretic.  HENT:     Head: Normocephalic and atraumatic.  Eyes:     Conjunctiva/sclera: Conjunctivae normal.     Pupils: Pupils are equal, round, and reactive to light.  Neck:     Musculoskeletal: Normal range of motion and neck supple.     Trachea: Phonation normal.  Cardiovascular:     Rate and Rhythm: Normal rate.  Pulmonary:     Effort: Pulmonary effort is normal.  Abdominal:     General: There is no distension.     Palpations: Abdomen is soft.     Tenderness: There is no abdominal tenderness. There is no guarding.  Genitourinary:    Comments: Normal external female genitalia.  Patient examined, supine and frog leg position.  Immediately inside the vaginal opening, is her cervix.  It appears normal.  There is no bleeding or drainage.  Patient not examined in the standing position, however I would suspect that the cervix would be more prominent, if she were to be examined, while standing. Musculoskeletal: Normal range of motion.  Skin:    General: Skin is warm and dry.  Neurological:     Mental Status: She is alert and oriented to person, place, and time.     Motor: No abnormal muscle tone.  Psychiatric:        Behavior: Behavior normal.        Thought Content: Thought content normal.        Judgment:  Judgment normal.      ED Treatments / Results  Labs (all labs ordered are listed, but only abnormal results are displayed) Labs Reviewed - No data to display  EKG None  Radiology No results found.  Procedures Procedures (including critical care time)  Medications Ordered in ED Medications - No data to display   Initial Impression / Assessment and Plan / ED Course  I have reviewed the triage vital signs and the nursing notes.  Pertinent labs & imaging results that were available during my care of the patient were reviewed by me and considered in my medical decision making (see chart for details).         Patient Vitals for the past 24 hrs:  BP Temp Temp src Pulse Resp SpO2  04/14/19 1115 - - - - - 100 %  04/14/19 1052 102/79 98.4 F (36.9 C) Oral 72 17 100 %    1:12 PM Reevaluation with update and discussion. After initial assessment and treatment, an updated evaluation reveals no change in clinical status. Daleen Bo   Medical Decision Making: Cervix prolapse, previously known, without suspected acute abnormality.  There are no other concerning problems, on documentation sent with the patient.  No indication for further work-up, at this time.  CRITICAL CARE- no Performed by: Daleen Bo  Nursing Notes Reviewed/ Care Coordinated Applicable Imaging Reviewed Interpretation of Laboratory Data incorporated into ED treatment  The patient appears reasonably screened and/or stabilized for discharge and I doubt any other medical condition or other [facility] requiring further screening, evaluation, or treatment in the ED at this time prior to discharge.  Plan: Home Medications-continue usual; Home Treatments-pelvic floor hygiene; return here if the recommended treatment, does not improve the symptoms; Recommended follow up-PCP/GYN, PRN   Final Clinical Impressions(s) / ED Diagnoses   Final diagnoses:  Cervix prolapsed into vagina    ED Discharge Orders    None        Daleen Bo, MD 04/14/19 1314

## 2019-04-14 NOTE — Discharge Instructions (Addendum)
The mass seen at the vaginal opening is her cervix.  This will not cause a problem, usually.  If she seems to be having pain, experiences bleeding, or other concerns with the pelvic region, have her see a gynecologist about it.

## 2019-04-14 NOTE — ED Notes (Signed)
Staff provided pt with peri care, placed her in clean brief, assisted her back into her clothes.  Pt moved to [place], provided with warm blankets.  Will continue to monitor.

## 2019-04-14 NOTE — ED Notes (Signed)
[facility] notified of need for transport. 

## 2019-04-23 IMAGING — CR DG CHEST 2V
2 series · 2 of 2 positions shown · non-contrast
Comparison: 06/21/2017

CLINICAL DATA: Altered mental status.

EXAM:
CHEST - 2 VIEW

[chest lat]
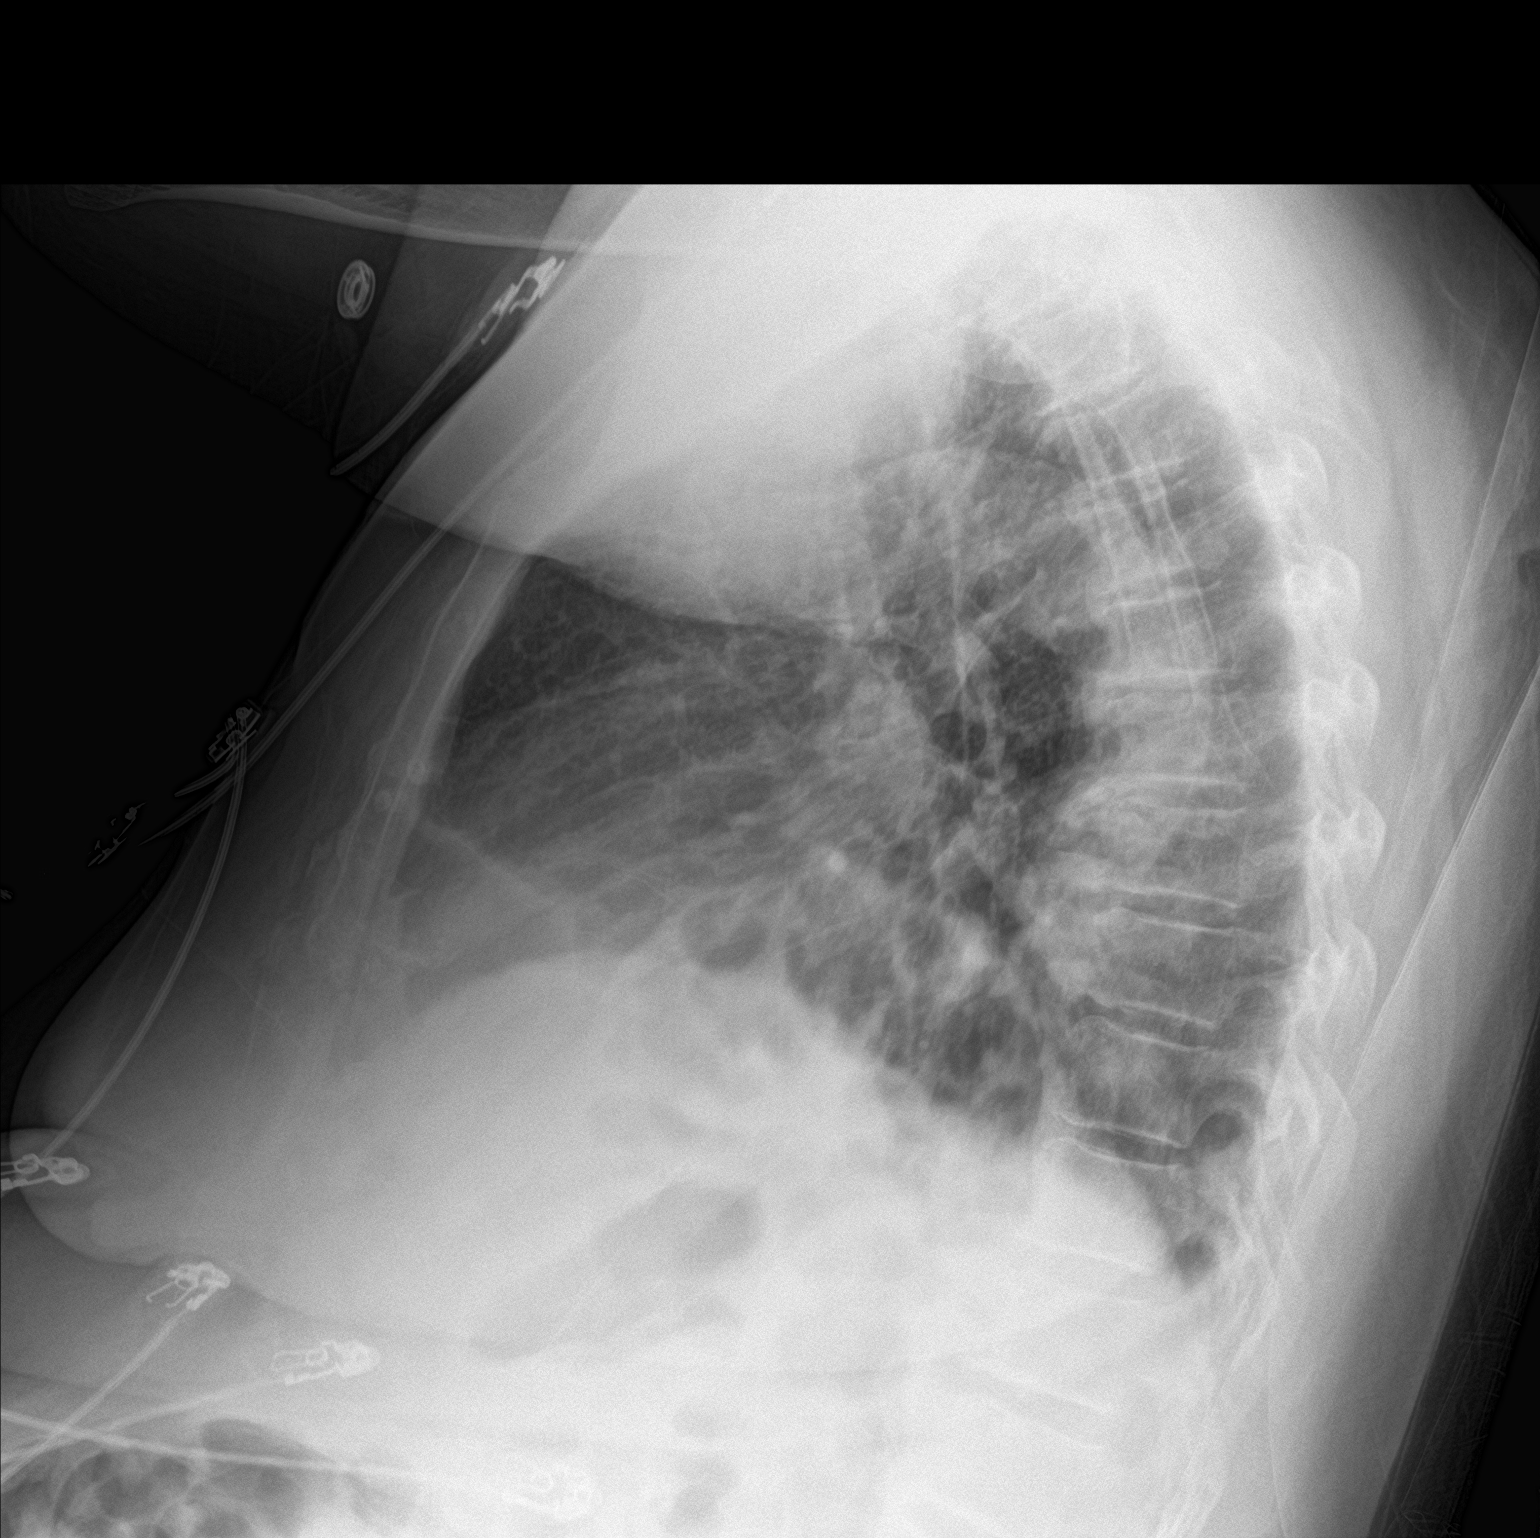

[chest ap]
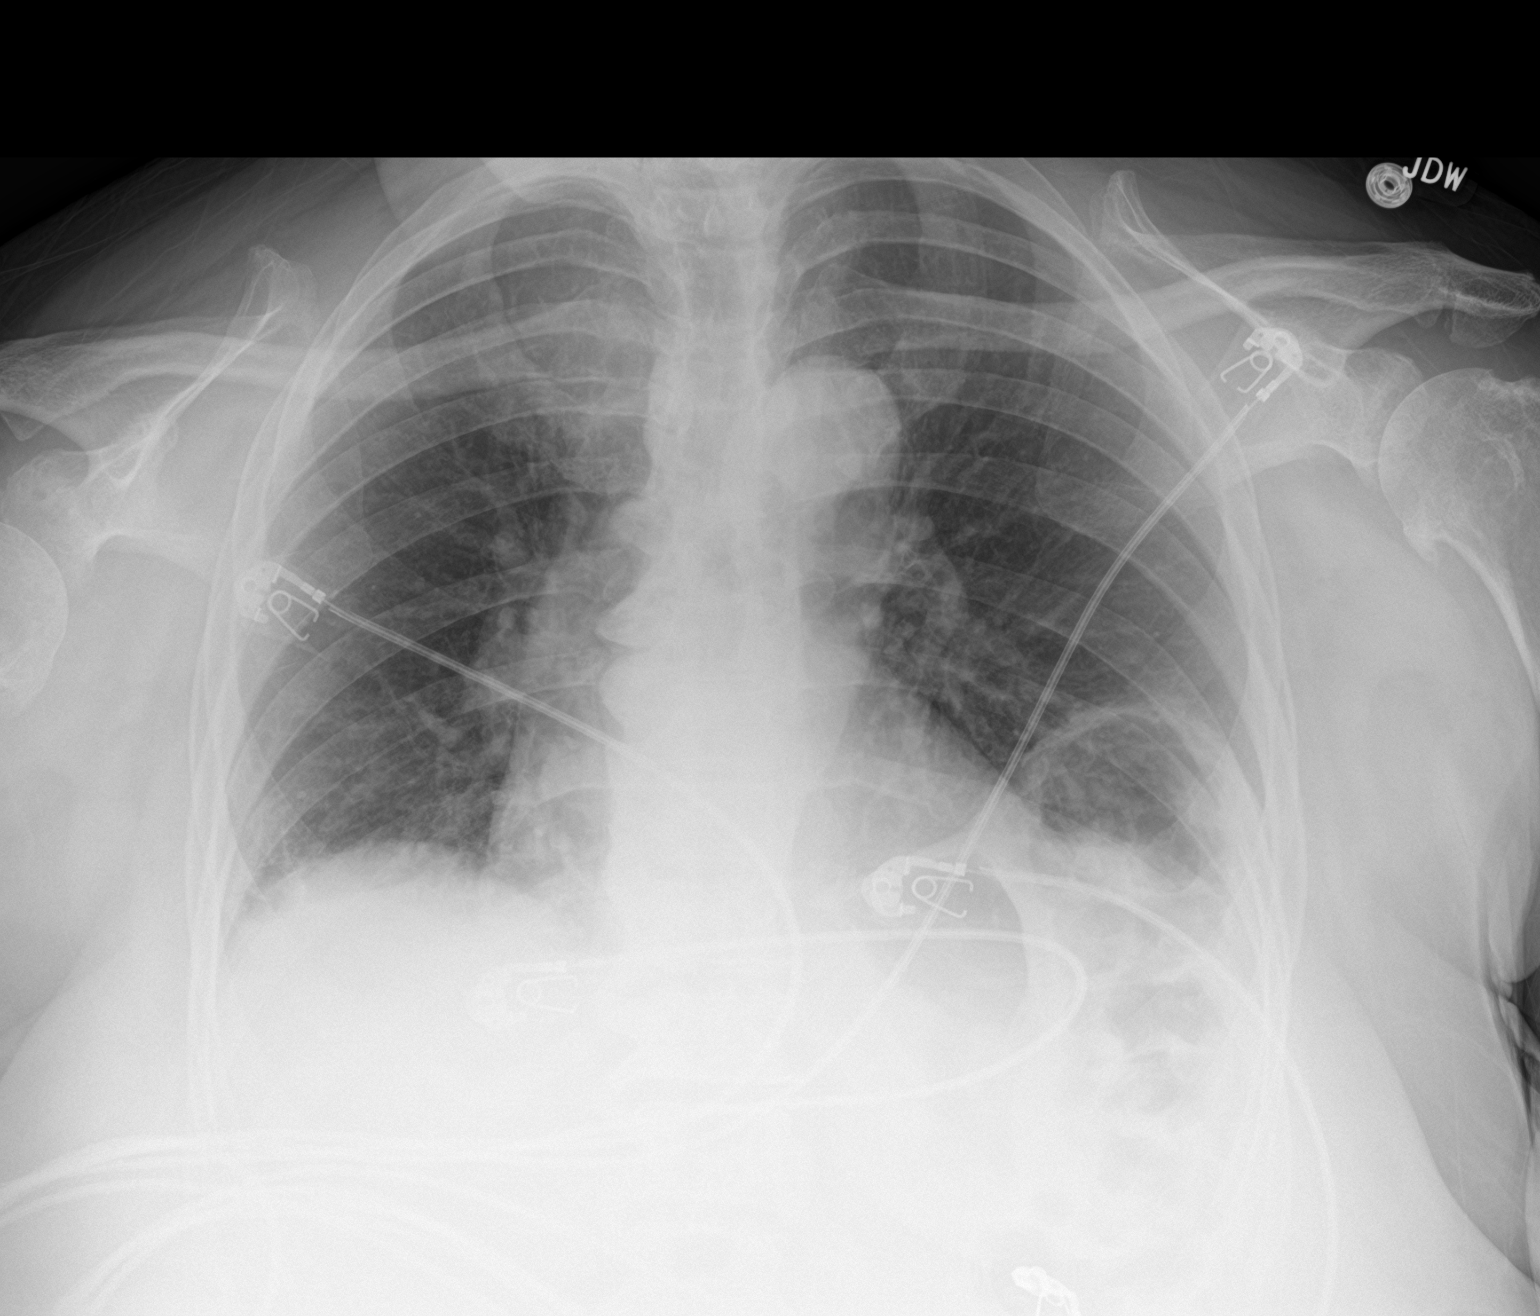

[2 of 2 positions shown; findings below may reference images not displayed]

FINDINGS: Mild cardiomegaly. Low lung volumes. Bibasilar atelectasis versus
airspace disease. There is elevation of the left hemidiaphragm. No
pneumothorax.
IMPRESSION: Bibasilar atelectasis versus airspace disease.

## 2020-01-18 ENCOUNTER — Encounter (HOSPITAL_COMMUNITY): Payer: Self-pay

## 2020-01-18 ENCOUNTER — Emergency Department (HOSPITAL_COMMUNITY)
Admission: EM | Admit: 2020-01-18 | Discharge: 2020-01-18 | Disposition: A | Discharge status: Home / Self Care | Payer: Medicare Other | Attending: Emergency Medicine | Admitting: Emergency Medicine

## 2020-01-18 ENCOUNTER — Emergency Department (HOSPITAL_COMMUNITY): Payer: Medicare Other

## 2020-01-18 DIAGNOSIS — Z79899 Other long term (current) drug therapy: Secondary | ICD-10-CM | POA: Diagnosis not present

## 2020-01-18 DIAGNOSIS — R251 Tremor, unspecified: Secondary | ICD-10-CM | POA: Diagnosis present

## 2020-01-18 DIAGNOSIS — Z7982 Long term (current) use of aspirin: Secondary | ICD-10-CM | POA: Diagnosis not present

## 2020-01-18 DIAGNOSIS — I10 Essential (primary) hypertension: Secondary | ICD-10-CM | POA: Insufficient documentation

## 2020-01-18 DIAGNOSIS — Z7984 Long term (current) use of oral hypoglycemic drugs: Secondary | ICD-10-CM | POA: Insufficient documentation

## 2020-01-18 DIAGNOSIS — E119 Type 2 diabetes mellitus without complications: Secondary | ICD-10-CM | POA: Insufficient documentation

## 2020-01-18 DIAGNOSIS — G259 Extrapyramidal and movement disorder, unspecified: Secondary | ICD-10-CM | POA: Diagnosis not present

## 2020-01-18 LAB — CBC WITH DIFFERENTIAL/PLATELET
Abs Immature Granulocytes: 0.01 10*3/uL (ref 0.00–0.07)
Basophils Absolute: 0 10*3/uL (ref 0.0–0.1)
Basophils Relative: 0 %
Eosinophils Absolute: 0 10*3/uL (ref 0.0–0.5)
Eosinophils Relative: 0 %
HCT: 43.5 % (ref 36.0–46.0)
Hemoglobin: 13.3 g/dL (ref 12.0–15.0)
Immature Granulocytes: 0 %
Lymphocytes Relative: 30 %
Lymphs Abs: 1.5 10*3/uL (ref 0.7–4.0)
MCH: 28 pg (ref 26.0–34.0)
MCHC: 30.6 g/dL (ref 30.0–36.0)
MCV: 91.6 fL (ref 80.0–100.0)
Monocytes Absolute: 0.5 10*3/uL (ref 0.1–1.0)
Monocytes Relative: 9 %
Neutro Abs: 3.2 10*3/uL (ref 1.7–7.7)
Neutrophils Relative %: 61 %
Platelets: 178 10*3/uL (ref 150–400)
RBC: 4.75 MIL/uL (ref 3.87–5.11)
RDW: 13 % (ref 11.5–15.5)
WBC: 5.2 10*3/uL (ref 4.0–10.5)
nRBC: 0 % (ref 0.0–0.2)

## 2020-01-18 LAB — LITHIUM LEVEL: Lithium Lvl: 0.41 mmol/L — ABNORMAL LOW (ref 0.60–1.20)

## 2020-01-18 LAB — BASIC METABOLIC PANEL
Anion gap: 7 (ref 5–15)
BUN: 13 mg/dL (ref 8–23)
CO2: 28 mmol/L (ref 22–32)
Calcium: 9.9 mg/dL (ref 8.9–10.3)
Chloride: 105 mmol/L (ref 98–111)
Creatinine, Ser: 0.91 mg/dL (ref 0.44–1.00)
GFR calc Af Amer: >60 mL/min (ref >60)
GFR calc non Af Amer: >60 mL/min (ref >60)
Glucose, Bld: 91 mg/dL (ref 70–99)
Potassium: 4.4 mmol/L (ref 3.5–5.1)
Sodium: 140 mmol/L (ref 135–145)

## 2020-01-18 LAB — URINALYSIS, ROUTINE W REFLEX MICROSCOPIC
Bilirubin Urine: NEGATIVE
Glucose, UA: NEGATIVE mg/dL
Hgb urine dipstick: NEGATIVE
Ketones, ur: NEGATIVE mg/dL
Leukocytes,Ua: NEGATIVE
Nitrite: NEGATIVE
Protein, ur: NEGATIVE mg/dL
Specific Gravity, Urine: 1.017 (ref 1.005–1.030)
pH: 6 (ref 5.0–8.0)

## 2020-01-18 LAB — AMMONIA: Ammonia: 29 umol/L (ref 9–35)

## 2020-01-18 MED ORDER — DIPHENHYDRAMINE HCL 50 MG/ML IJ SOLN
50.0000 mg | Freq: Once | INTRAMUSCULAR | Status: AC
  Administered 2020-01-18: 50 mg via INTRAVENOUS
  Filled 2020-01-18: qty 1

## 2020-01-18 NOTE — ED Notes (Signed)
Attempting to call [facility] for transport.

## 2020-01-18 NOTE — ED Notes (Signed)
Attempted to call [facility] to notify pt is returning, no answer.

## 2020-01-18 NOTE — ED Notes (Signed)
[facility] called for transport.  

## 2020-01-18 NOTE — Discharge Instructions (Addendum)
Your labs and imaging of your head are reassuring today. Continue taking your medications as prescribed Follow up with your PCP regarding the ED visit today Return for any worsening symptoms

## 2020-01-18 NOTE — ED Notes (Signed)
Pt provided with Sprite and sandwich.

## 2020-01-18 NOTE — ED Provider Notes (Signed)
[facility] DEPT Provider Note   CSN: [id number] Arrival date & time: 01/18/20  1210     History No chief complaint on file.  LEVEL 5 CAVEAT - DEMENTIA  Jenna Powell is a 69 y.o. female with PMHx dementia, DM, HTN, HLD who presents to the ED via EMS from [facility]. Per EMS/triage report pt complaints of tremors that started today in her bilateral arms and mouth.  While in the room with patient states she states this has been ongoing for "15 years."  Patient states this is no worse than normal.  She cannot give much more information regarding this.  She denies any changes in medications.  She is alert and oriented times person, place, time.  He appears to be somewhat confused regarding situation.   Information received from [facility], they report that patient was concerned she was having "TIAs." They cannot elaborate further as to why patient thought this. No history of same.  Then states she was sent here to have a CBC done due to being on Clozapine - unsure if they can check labs at facility or not.  When asked if tremors are new they state now however states it seemed worse than normal today.  No recent changes in medications.    The history is provided by the patient and medical records.       Past Medical History:  Diagnosis Date  . Dementia ([place])   . Diabetes mellitus without complication ([place])   . Hyperlipidemia   . Hypertension     Patient Active Problem List   Diagnosis Date Noted  . Polypharmacy 11/23/2018  . Dementia without behavioral disturbance ([place]) 11/22/2018  . Bipolar disorder ([place]) 11/22/2018  . DM (diabetes mellitus), type 2 ([place]) 11/22/2018  . Essential hypertension 11/22/2018    History reviewed. No pertinent surgical history.   OB History    Gravida  7   Para      Term      Preterm      AB      Living  7     SAB      TAB      Ectopic      Multiple      Live Births              History  reviewed. No pertinent family history.  Social History   Tobacco Use  . Smoking status: Never Smoker  . Smokeless tobacco: Never Used  Substance Use Topics  . Alcohol use: No  . Drug use: Never    Home Medications Prior to Admission medications   Medication Sig Start Date End Date Taking? Authorizing Provider  acetaminophen (TYLENOL) 500 MG tablet Take 500 mg by mouth every 6 (six) hours as needed for fever.   Yes [provider]  alum & mag hydroxide-simeth (GERI-LANTA) 200-200-20 MG/5ML suspension Take 30 mLs by mouth every 6 (six) hours as needed for indigestion or heartburn.   Yes [provider]  Ascorbic Acid (VITAMIN C) 500 MG CHEW Chew 500 mg by mouth in the morning and at bedtime.   Yes [provider]  aspirin 81 MG EC tablet Take 81 mg by mouth daily. Swallow whole.   Yes [provider]  benzocaine (BABY ORAJEL) 7.5 % oral gel Use as directed 1 application in the mouth or throat 3 (three) times daily as needed for pain.   Yes [provider]  benztropine (COGENTIN) 1 MG tablet Take 1 tablet (  1 mg total) by mouth 2 (two) times daily. 11/23/18  Yes [place] Donnamarie Poag, DO  Calcium Carbonate-Vitamin D (CALCIUM 600+D) 600-400 MG-UNIT tablet Take 1 tablet by mouth every 12 (twelve) hours.   Yes [provider]  Calcium Polycarbophil (FIBER LAXATIVE PO) Take 1 tablet by mouth 2 (two) times daily. 650mg    Yes [provider]  clozapine (CLOZARIL) 200 MG tablet Take 200-225 mg by mouth See admin instructions. 200mg  bid @0800  and @8pm  with 25mg  additional with evening dose   Yes [provider]  Docusate Sodium 100 MG capsule Take 100 mg by mouth 3 (three) times daily.    Yes [provider]  donepezil (ARICEPT) 10 MG tablet Take 10 mg 2 (two) times daily by mouth.    Yes [provider]  guaifenesin (ROBITUSSIN) 100 MG/5ML syrup Take 200 mg by mouth every 6 (six) hours as needed for cough.   Yes  [provider]  lactulose (CHRONULAC) 10 GM/15ML solution Take 20 g by mouth daily.   Yes [provider]  lithium carbonate (ESKALITH) 450 MG CR tablet Take 450 mg by mouth at bedtime.   Yes [provider]  loperamide (IMODIUM A-D) 2 MG tablet Take 2 mg by mouth as needed for diarrhea or loose stools.   Yes [provider]  magnesium hydroxide (MILK OF MAGNESIA) 400 MG/5ML suspension Take 30 mLs by mouth at bedtime as needed for mild constipation.   Yes [provider]  metoprolol succinate (TOPROL-XL) 25 MG 24 hr tablet Take 25 mg by mouth daily.   Yes [provider]  mirabegron ER (MYRBETRIQ) 50 MG TB24 tablet Take 50 mg by mouth daily.   Yes [provider]  neomycin-bacitracin-polymyxin (NEOSPORIN) [phone] ointment Apply 1 application topically as needed (skin tears).   Yes [provider]  Polyvinyl Alcohol (LIQUID TEARS OP) Place 1 drop 3 (three) times daily into both eyes.   Yes [provider]  pravastatin (PRAVACHOL) 20 MG tablet Take 20 mg by mouth every evening.    Yes [provider]  risperidone (RISPERDAL) 4 MG tablet Take 4 mg by mouth at bedtime.    Yes [provider]  senna (SENOKOT) 8.6 MG TABS tablet Take 2 tablets at bedtime by mouth.   Yes [provider]  sertraline (ZOLOFT) 50 MG tablet Take 75 mg by mouth daily.    Yes [provider]  risperidone (RISPERDAL) 4 MG tablet Take 0.5 tablets (2 mg total) by mouth 2 (two) times daily. Patient not taking: Reported on 04/14/2019 11/23/18   [place] Donnamarie Poag, DO  traZODone (DESYREL) 100 MG tablet Take 0.5 tablets (50 mg total) by mouth at bedtime. Patient not taking: Reported on 04/14/2019 11/23/18   [place] Eric J, DO    Allergies    Patient has no known allergies.  Review of Systems   Review of Systems  Unable to perform ROS: Dementia  Neurological: Positive for tremors.   Physical Exam Updated Vital  Signs BP (!) 149/106 (BP Location: Right Arm)   Pulse (!) 110   Temp 99.1 F (37.3 C) (Oral)   Resp 18   SpO2 99%   Physical Exam Vitals and nursing note reviewed.  Constitutional:      Appearance: She is not ill-appearing or diaphoretic.  HENT:     Head: Normocephalic and atraumatic.  Eyes:     Extraocular Movements: Extraocular movements intact.     Conjunctiva/sclera: Conjunctivae normal.     Pupils: Pupils  are equal, round, and reactive to light.  Cardiovascular:     Rate and Rhythm: Normal rate and regular rhythm.     Pulses: Normal pulses.  Pulmonary:     Effort: Pulmonary effort is normal.     Breath sounds: Normal breath sounds. No wheezing, rhonchi or rales.  Abdominal:     Palpations: Abdomen is soft.     Tenderness: There is no abdominal tenderness. There is no guarding or rebound.  Musculoskeletal:     Cervical back: Neck supple.  Skin:    General: Skin is warm and dry.  Neurological:     Mental Status: She is alert.     Comments: Diffuse BUE tremors noted at rest however spontaneously cease on their own.  Pt is alert and oriented to person, place, and time. She has somewhat difficulty regarding situation/why she is here today.  Able to follow commands without difficulty. Strength 5/5 to BUE and BLEs. Sensation intact throughout.      ED Results / Procedures / Treatments   Labs (all labs ordered are listed, but only abnormal results are displayed) Labs Reviewed  LITHIUM LEVEL - Abnormal; Notable for the following components:      Result Value   Lithium Lvl 0.41 (*)    All other components within normal limits  URINALYSIS, ROUTINE W REFLEX MICROSCOPIC - Abnormal; Notable for the following components:   APPearance HAZY (*)    All other components within normal limits  BASIC METABOLIC PANEL  CBC WITH DIFFERENTIAL/PLATELET  AMMONIA    EKG None  Radiology CT Head Wo Contrast  Result Date: 01/18/2020 CLINICAL DATA:  Altered mental status tremors  EXAM: CT HEAD WITHOUT CONTRAST TECHNIQUE: Contiguous axial images were obtained from the base of the skull through the vertex without intravenous contrast. COMPARISON:  CT brain 11/21/2018 FINDINGS: Brain: No evidence of acute infarction, hemorrhage, hydrocephalus, extra-axial collection or mass lesion/mass effect. Vascular: No hyperdense vessel or unexpected calcification. Skull: Normal. Negative for fracture or focal lesion. Sinuses/Orbits: No acute finding. Mild mucosal thickening. Small left maxillary mucous retention cyst Other: None IMPRESSION: Negative.  No CT evidence for acute intracranial abnormality Electronically Signed   By: Donavan Foil M.D.   On: 01/18/2020 15:16    Procedures Procedures (including critical care time)  Medications Ordered in ED Medications  diphenhydrAMINE (BENADRYL) injection 50 mg (50 mg Intravenous Given 01/18/20 1346)    ED Course  I have reviewed the triage vital signs and the nursing notes.  Pertinent labs & imaging results that were available during my care of the patient were reviewed by me and considered in my medical decision making (see chart for details).    MDM Rules/Calculators/A&P                          69 year old female from skilled [place] who presents to the ED via EMS with complaint of tremors to her bilateral arms and mouth.  Patient is on a multitude of psych medications including Cogentin, Risperdal, Clozapine, Zoloft, lithium.  He has dementia at baseline, is alert to self, place, time however she is hazy on the situation that brought her here.  He is unable to give me much more information besides the shaking in her arms however she states this has been going on for 15 years.  Difficult to say if this is new however her findings on exam are consistent with extrapyramidal side effects, Benadryl ordered.  Information obtained by  staff, they report that patient was concerned she had a TIA however they cannot say why  she thought this.  No focal neuro deficits per staff and not on my exam today.  No history of TIAs.  I went back into the room to specifically asked patient why she thought this but she is unable to tell me why.  Staff at nursing facility would also like patient's CBC checked as she is on clozapine.  Unsure if they can do this at facility.  Will work-up today with screening labs including CBC, BMP, UA, lithium level, ammonia level as patient is currently on lactulose.  Will obtain CT head.   On reevaluation after Benadryl patient's tremors have resolved.   CBC without leukocytosis. Hgb stable.  BMP without electrolyte abnormalities Ammonia within normal limits at 29 Lithium level subtherapeutic at 0.41 U/A without signs of infection CT Head negative  Pt continues to rest comfortably. Given her arm tremors have been a chronic issue I do not think we need to admit patient today. She is advised to continue taking her meds as prescribed and to follow up with her PCP.   This note was prepared using Dragon voice recognition software and may include unintentional dictation errors due to the inherent limitations of voice recognition software.  Final Clinical Impression(s) / ED Diagnoses Final diagnoses:  Extrapyramidal and movement disorder, unspecified    Rx / DC Orders ED Discharge Orders    None       Discharge Instructions     Your labs and imaging of your head are reassuring today. Continue taking your medications as prescribed Follow up with your PCP regarding the ED visit today Return for any worsening symptoms       Eustaquio Maize, PA-C 01/18/20 1641    Carmin Muskrat, MD 01/19/20 2228

## 2020-01-18 NOTE — ED Triage Notes (Signed)
Patient arrived via [facility] from [facility].   C/O tremors that started today in arms and mouth.   Patient on a lot of psych meds per ems   Denies any other complaints   A/o at baseline Hx. Dementia   No neuro deficits per ems

## 2020-01-26 ENCOUNTER — Observation Stay (HOSPITAL_COMMUNITY)
Admission: EM | Admit: 2020-01-26 | Discharge: 2020-01-27 | Disposition: A | Discharge status: Home Health | Payer: Medicare Other | Attending: Internal Medicine | Admitting: Internal Medicine

## 2020-01-26 ENCOUNTER — Encounter (HOSPITAL_COMMUNITY): Payer: Self-pay | Admitting: Emergency Medicine

## 2020-01-26 ENCOUNTER — Emergency Department (HOSPITAL_COMMUNITY): Payer: Medicare Other

## 2020-01-26 ENCOUNTER — Other Ambulatory Visit: Payer: Self-pay

## 2020-01-26 DIAGNOSIS — R4182 Altered mental status, unspecified: Secondary | ICD-10-CM | POA: Diagnosis present

## 2020-01-26 DIAGNOSIS — E119 Type 2 diabetes mellitus without complications: Secondary | ICD-10-CM | POA: Diagnosis not present

## 2020-01-26 DIAGNOSIS — R400 Somnolence: Secondary | ICD-10-CM

## 2020-01-26 DIAGNOSIS — Z7982 Long term (current) use of aspirin: Secondary | ICD-10-CM | POA: Diagnosis not present

## 2020-01-26 DIAGNOSIS — Z20822 Contact with and (suspected) exposure to covid-19: Secondary | ICD-10-CM | POA: Insufficient documentation

## 2020-01-26 DIAGNOSIS — F209 Schizophrenia, unspecified: Secondary | ICD-10-CM | POA: Diagnosis not present

## 2020-01-26 DIAGNOSIS — F319 Bipolar disorder, unspecified: Secondary | ICD-10-CM | POA: Insufficient documentation

## 2020-01-26 DIAGNOSIS — F0390 Unspecified dementia without behavioral disturbance: Secondary | ICD-10-CM | POA: Insufficient documentation

## 2020-01-26 DIAGNOSIS — E785 Hyperlipidemia, unspecified: Secondary | ICD-10-CM | POA: Diagnosis not present

## 2020-01-26 DIAGNOSIS — I10 Essential (primary) hypertension: Secondary | ICD-10-CM | POA: Insufficient documentation

## 2020-01-26 DIAGNOSIS — Z79899 Other long term (current) drug therapy: Secondary | ICD-10-CM | POA: Insufficient documentation

## 2020-01-26 DIAGNOSIS — G9340 Encephalopathy, unspecified: Secondary | ICD-10-CM

## 2020-01-26 DIAGNOSIS — G92 Toxic encephalopathy: Secondary | ICD-10-CM | POA: Diagnosis not present

## 2020-01-26 LAB — CBC WITH DIFFERENTIAL/PLATELET
Abs Immature Granulocytes: 0.03 10*3/uL (ref 0.00–0.07)
Basophils Absolute: 0 10*3/uL (ref 0.0–0.1)
Basophils Relative: 0 %
Eosinophils Absolute: 0 10*3/uL (ref 0.0–0.5)
Eosinophils Relative: 0 %
HCT: 38.5 % (ref 36.0–46.0)
Hemoglobin: 11.7 g/dL — ABNORMAL LOW (ref 12.0–15.0)
Immature Granulocytes: 1 %
Lymphocytes Relative: 28 %
Lymphs Abs: 1.3 10*3/uL (ref 0.7–4.0)
MCH: 27.7 pg (ref 26.0–34.0)
MCHC: 30.4 g/dL (ref 30.0–36.0)
MCV: 91 fL (ref 80.0–100.0)
Monocytes Absolute: 0.4 10*3/uL (ref 0.1–1.0)
Monocytes Relative: 8 %
Neutro Abs: 2.9 10*3/uL (ref 1.7–7.7)
Neutrophils Relative %: 63 %
Platelets: 173 10*3/uL (ref 150–400)
RBC: 4.23 MIL/uL (ref 3.87–5.11)
RDW: 12.6 % (ref 11.5–15.5)
WBC: 4.7 10*3/uL (ref 4.0–10.5)
nRBC: 0 % (ref 0.0–0.2)

## 2020-01-26 LAB — COMPREHENSIVE METABOLIC PANEL
ALT: 13 U/L (ref 0–44)
AST: 15 U/L (ref 15–41)
Albumin: 3.1 g/dL — ABNORMAL LOW (ref 3.5–5.0)
Alkaline Phosphatase: 64 U/L (ref 38–126)
Anion gap: 8 (ref 5–15)
BUN: 9 mg/dL (ref 8–23)
CO2: 25 mmol/L (ref 22–32)
Calcium: 8.8 mg/dL — ABNORMAL LOW (ref 8.9–10.3)
Chloride: 109 mmol/L (ref 98–111)
Creatinine, Ser: 0.86 mg/dL (ref 0.44–1.00)
GFR calc Af Amer: >60 mL/min (ref >60)
GFR calc non Af Amer: >60 mL/min (ref >60)
Glucose, Bld: 92 mg/dL (ref 70–99)
Potassium: 3.8 mmol/L (ref 3.5–5.1)
Sodium: 142 mmol/L (ref 135–145)
Total Bilirubin: 0.8 mg/dL (ref 0.3–1.2)
Total Protein: 5 g/dL — ABNORMAL LOW (ref 6.5–8.1)

## 2020-01-26 LAB — URINALYSIS, COMPLETE (UACMP) WITH MICROSCOPIC
Bilirubin Urine: NEGATIVE
Glucose, UA: NEGATIVE mg/dL
Hgb urine dipstick: NEGATIVE
Ketones, ur: NEGATIVE mg/dL
Leukocytes,Ua: NEGATIVE
Nitrite: NEGATIVE
Protein, ur: NEGATIVE mg/dL
Specific Gravity, Urine: 1.006 (ref 1.005–1.030)
pH: 6 (ref 5.0–8.0)

## 2020-01-26 LAB — I-STAT CHEM 8, ED
BUN: 12 mg/dL (ref 8–23)
Calcium, Ion: 1.18 mmol/L (ref 1.15–1.40)
Chloride: 105 mmol/L (ref 98–111)
Creatinine, Ser: 1 mg/dL (ref 0.44–1.00)
Glucose, Bld: 97 mg/dL (ref 70–99)
HCT: 38 % (ref 36.0–46.0)
Hemoglobin: 12.9 g/dL (ref 12.0–15.0)
Potassium: 5.5 mmol/L — ABNORMAL HIGH (ref 3.5–5.1)
Sodium: 140 mmol/L (ref 135–145)
TCO2: 29 mmol/L (ref 22–32)

## 2020-01-26 LAB — RAPID URINE DRUG SCREEN, HOSP PERFORMED
Amphetamines: NOT DETECTED
Barbiturates: NOT DETECTED
Benzodiazepines: NOT DETECTED
Cocaine: NOT DETECTED
Opiates: NOT DETECTED
Tetrahydrocannabinol: NOT DETECTED

## 2020-01-26 LAB — ETHANOL: Alcohol, Ethyl (B): <10 mg/dL (ref <10)

## 2020-01-26 LAB — I-STAT VENOUS BLOOD GAS, ED
Acid-Base Excess: 7 mmol/L — ABNORMAL HIGH (ref 0.0–2.0)
Bicarbonate: 30.4 mmol/L — ABNORMAL HIGH (ref 20.0–28.0)
Calcium, Ion: 1.16 mmol/L (ref 1.15–1.40)
HCT: 38 % (ref 36.0–46.0)
Hemoglobin: 12.9 g/dL (ref 12.0–15.0)
O2 Saturation: 99 %
Potassium: 5.5 mmol/L — ABNORMAL HIGH (ref 3.5–5.1)
Sodium: 140 mmol/L (ref 135–145)
TCO2: 32 mmol/L (ref 22–32)
pCO2, Ven: 38.3 mmHg — ABNORMAL LOW (ref 44.0–60.0)
pH, Ven: 7.508 — ABNORMAL HIGH (ref 7.250–7.430)
pO2, Ven: 123 mmHg — ABNORMAL HIGH (ref 32.0–45.0)

## 2020-01-26 LAB — AMMONIA: Ammonia: 54 umol/L — ABNORMAL HIGH (ref 9–35)

## 2020-01-26 LAB — SARS CORONAVIRUS 2 BY RT PCR (HOSPITAL ORDER, PERFORMED IN ~~LOC~~ HOSPITAL LAB): SARS Coronavirus 2: NEGATIVE

## 2020-01-26 LAB — TROPONIN I (HIGH SENSITIVITY)
Troponin I (High Sensitivity): 2 ng/L (ref <18)
Troponin I (High Sensitivity): <2 ng/L (ref <18)

## 2020-01-26 LAB — LITHIUM LEVEL: Lithium Lvl: 0.42 mmol/L — ABNORMAL LOW (ref 0.60–1.20)

## 2020-01-26 MED ORDER — METOPROLOL SUCCINATE ER 25 MG PO TB24
25.0000 mg | ORAL_TABLET | Freq: Every day | ORAL | Status: DC
  Administered 2020-01-27: 25 mg via ORAL
  Filled 2020-01-26: qty 1

## 2020-01-26 MED ORDER — ACETAMINOPHEN 650 MG RE SUPP: 650.0000 mg | Freq: Four times a day (QID) | RECTAL | Status: DC | PRN

## 2020-01-26 MED ORDER — ENOXAPARIN SODIUM 40 MG/0.4ML ~~LOC~~ SOLN: 40.0000 mg | SUBCUTANEOUS | Status: DC

## 2020-01-26 MED ORDER — ASPIRIN EC 81 MG PO TBEC
81.0000 mg | DELAYED_RELEASE_TABLET | Freq: Every day | ORAL | Status: DC
  Administered 2020-01-27: 81 mg via ORAL
  Filled 2020-01-26: qty 1

## 2020-01-26 MED ORDER — ACETAMINOPHEN 325 MG PO TABS: 650.0000 mg | ORAL_TABLET | Freq: Four times a day (QID) | ORAL | Status: DC | PRN

## 2020-01-26 MED ORDER — NALOXONE HCL 4 MG/0.1ML NA LIQD
1.0000 | Freq: Once | NASAL | Status: AC
  Administered 2020-01-26: 1 via NASAL
  Filled 2020-01-26: qty 4

## 2020-01-26 MED ORDER — SODIUM CHLORIDE 0.9 % IV SOLN: INTRAVENOUS | Status: AC

## 2020-01-26 MED ORDER — SODIUM CHLORIDE 0.9 % IV BOLUS
1000.0000 mL | Freq: Once | INTRAVENOUS | Status: AC
  Administered 2020-01-26: 1000 mL via INTRAVENOUS

## 2020-01-26 MED ORDER — PRAVASTATIN SODIUM 10 MG PO TABS
20.0000 mg | ORAL_TABLET | Freq: Every evening | ORAL | Status: DC
  Administered 2020-01-26 – 2020-01-27 (×2): 20 mg via ORAL
  Filled 2020-01-26 (×2): qty 2

## 2020-01-26 MED ORDER — ENOXAPARIN SODIUM 40 MG/0.4ML ~~LOC~~ SOLN
40.0000 mg | SUBCUTANEOUS | Status: DC
  Administered 2020-01-27: 40 mg via SUBCUTANEOUS
  Filled 2020-01-26: qty 0.4

## 2020-01-26 NOTE — ED Provider Notes (Signed)
[place] EMERGENCY DEPARTMENT Provider Note   CSN: [id number] Arrival date & time: 01/26/20  1311     History Chief Complaint  Patient presents with  . Altered Mental Status    Jenna Powell is a 69 y.o. female.  Patient with history of dementia presents from [facility] assisted living with altered mental status.  Per EMS, patient was last seen well around 10:30 AM.  She did not wake up for the lunch after her nap per usual.  She is very lethargic with snoring respirations.  Level 5 caveat due to altered mental status.        Past Medical History:  Diagnosis Date  . Dementia ([place])   . Diabetes mellitus without complication ([place])   . Hyperlipidemia   . Hypertension     Patient Active Problem List   Diagnosis Date Noted  . Polypharmacy 11/23/2018  . Dementia without behavioral disturbance ([place]) 11/22/2018  . Bipolar disorder ([place]) 11/22/2018  . DM (diabetes mellitus), type 2 ([place]) 11/22/2018  . Essential hypertension 11/22/2018    History reviewed. No pertinent surgical history.   OB History    Gravida  7   Para      Term      Preterm      AB      Living  7     SAB      TAB      Ectopic      Multiple      Live Births              History reviewed. No pertinent family history.  Social History   Tobacco Use  . Smoking status: Never Smoker  . Smokeless tobacco: Never Used  Substance Use Topics  . Alcohol use: No  . Drug use: Never    Home Medications Prior to Admission medications   Medication Sig Start Date End Date Taking? Authorizing Provider  acetaminophen (TYLENOL) 500 MG tablet Take 500 mg by mouth every 6 (six) hours as needed for fever.    [provider]  alum & mag hydroxide-simeth (GERI-LANTA) 200-200-20 MG/5ML suspension Take 30 mLs by mouth every 6 (six) hours as needed for indigestion or heartburn.    [provider]  Ascorbic Acid (VITAMIN C) 500 MG CHEW Chew 500 mg by mouth  in the morning and at bedtime.    [provider]  aspirin 81 MG EC tablet Take 81 mg by mouth daily. Swallow whole.    [provider]  benzocaine (BABY ORAJEL) 7.5 % oral gel Use as directed 1 application in the mouth or throat 3 (three) times daily as needed for pain.    [provider]  benztropine (COGENTIN) 1 MG tablet Take 1 tablet (1 mg total) by mouth 2 (two) times daily. 11/23/18   [place] Donnamarie Poag, DO  Calcium Carbonate-Vitamin D (CALCIUM 600+D) 600-400 MG-UNIT tablet Take 1 tablet by mouth every 12 (twelve) hours.    [provider]  Calcium Polycarbophil (FIBER LAXATIVE PO) Take 1 tablet by mouth 2 (two) times daily. 650mg     [provider]  clozapine (CLOZARIL) 200 MG tablet Take 200-225 mg by mouth See admin instructions. 200mg  bid @0800  and @8pm  with 25mg  additional with evening dose    [provider]  Docusate Sodium 100 MG capsule Take 100 mg by mouth 3 (three) times daily.     [provider]  donepezil (ARICEPT) 10 MG tablet Take 10 mg 2 (two) times  daily by mouth.     [provider]  guaifenesin (ROBITUSSIN) 100 MG/5ML syrup Take 200 mg by mouth every 6 (six) hours as needed for cough.    [provider]  lactulose (CHRONULAC) 10 GM/15ML solution Take 20 g by mouth daily.    [provider]  lithium carbonate (ESKALITH) 450 MG CR tablet Take 450 mg by mouth at bedtime.    [provider]  loperamide (IMODIUM A-D) 2 MG tablet Take 2 mg by mouth as needed for diarrhea or loose stools.    [provider]  magnesium hydroxide (MILK OF MAGNESIA) 400 MG/5ML suspension Take 30 mLs by mouth at bedtime as needed for mild constipation.    [provider]  metoprolol succinate (TOPROL-XL) 25 MG 24 hr tablet Take 25 mg by mouth daily.    [provider]  mirabegron ER (MYRBETRIQ) 50 MG TB24 tablet Take 50 mg by mouth daily.    [provider]    neomycin-bacitracin-polymyxin (NEOSPORIN) [phone] ointment Apply 1 application topically as needed (skin tears).    [provider]  Polyvinyl Alcohol (LIQUID TEARS OP) Place 1 drop 3 (three) times daily into both eyes.    [provider]  pravastatin (PRAVACHOL) 20 MG tablet Take 20 mg by mouth every evening.     [provider]  risperidone (RISPERDAL) 4 MG tablet Take 0.5 tablets (2 mg total) by mouth 2 (two) times daily. Patient not taking: Reported on 04/14/2019 11/23/18   [place] Donnamarie Poag, DO  risperidone (RISPERDAL) 4 MG tablet Take 4 mg by mouth at bedtime.     [provider]  senna (SENOKOT) 8.6 MG TABS tablet Take 2 tablets at bedtime by mouth.    [provider]  sertraline (ZOLOFT) 50 MG tablet Take 75 mg by mouth daily.     [provider]  traZODone (DESYREL) 100 MG tablet Take 0.5 tablets (50 mg total) by mouth at bedtime. Patient not taking: Reported on 04/14/2019 11/23/18   [place] Eric J, DO    Allergies    Patient has no known allergies.  Review of Systems   Review of Systems  Unable to perform ROS: Mental status change    Physical Exam Updated Vital Signs BP (!) 111/59 (BP Location: Right Arm)   Pulse 61   Temp 98.3 F (36.8 C) (Oral)   Resp 18   Ht 5\' 1"  (1.549 m)   Wt 90.7 kg   SpO2 97%   BMI 37.78 kg/m   Physical Exam Vitals and nursing note reviewed.  Constitutional:      Appearance: She is well-developed.  HENT:     Head: Normocephalic and atraumatic.     Right Ear: External ear normal.     Left Ear: External ear normal.     Nose: Nose normal.     Mouth/Throat:     Pharynx: Uvula midline.  Eyes:     General: Lids are normal.     Extraocular Movements:     Right eye: No nystagmus.     Left eye: No nystagmus.     Conjunctiva/sclera: Conjunctivae normal.     Pupils: Pupils are equal, round, and reactive to light.     Right eye: Pupil is round.     Left eye: Pupil is round.     Comments:  Pupils 33mm/pinpoint  Cardiovascular:     Rate and Rhythm: Normal rate and regular rhythm.  Pulmonary:     Effort: Pulmonary effort is normal.  Breath sounds: Normal breath sounds.  Abdominal:     Palpations: Abdomen is soft.     Tenderness: There is no abdominal tenderness. There is no guarding or rebound.  Musculoskeletal:     Cervical back: Normal range of motion and neck supple. No tenderness or bony tenderness.  Skin:    General: Skin is warm and dry.  Neurological:     Mental Status: She is lethargic and disoriented.     GCS: GCS eye subscore is 3. GCS verbal subscore is 4. GCS motor subscore is 6.     Comments: Patient is lethargic.  She arouses to voice however falls back asleep quickly.  Speech is slurred and incomprehensible.  She can follow basic commands such as open your mouth.  She squeezes my fingers lightly bilaterally.  She is unable to comply with remainder of neuro exam.  She does not have flaccidity of her extremities, however does not comply when I pick her arms up and ask her to keep them up.      ED Results / Procedures / Treatments   Labs (all labs ordered are listed, but only abnormal results are displayed) Labs Reviewed  AMMONIA - Abnormal; Notable for the following components:      Result Value   Ammonia 54 (*)    All other components within normal limits  CBC WITH DIFFERENTIAL/PLATELET - Abnormal; Notable for the following components:   Hemoglobin 11.7 (*)    All other components within normal limits  I-STAT CHEM 8, ED - Abnormal; Notable for the following components:   Potassium 5.5 (*)    All other components within normal limits  I-STAT VENOUS BLOOD GAS, ED - Abnormal; Notable for the following components:   pH, Ven 7.508 (*)    pCO2, Ven 38.3 (*)    pO2, Ven 123.0 (*)    Bicarbonate 30.4 (*)    Acid-Base Excess 7.0 (*)    Potassium 5.5 (*)    All other components within normal limits  SARS CORONAVIRUS 2 BY RT PCR (HOSPITAL ORDER, [place] [facility])  ETHANOL  CBC WITH DIFFERENTIAL/PLATELET  URINALYSIS, COMPLETE (UACMP) WITH MICROSCOPIC  RAPID URINE DRUG SCREEN, HOSP PERFORMED  LITHIUM LEVEL  COMPREHENSIVE METABOLIC PANEL  CBG MONITORING, ED  TROPONIN I (HIGH SENSITIVITY)    EKG EKG Interpretation  Date/Time:  Thursday January 26 2020 13:26:38 EDT Ventricular Rate:  61 PR Interval:    QRS Duration: 91 QT Interval:  376 QTC Calculation: 379 R Axis:   28 Text Interpretation: Sinus rhythm Low voltage, precordial leads Abnormal R-wave progression, early transition Borderline T abnormalities, anterior leads No significant change since last tracing Confirmed by Gareth Morgan [phone]) on 01/26/2020 1:37:00 PM   Radiology CT HEAD WO CONTRAST  Result Date: 01/26/2020 CLINICAL DATA:  Altered mental status, lethargy EXAM: CT HEAD WITHOUT CONTRAST TECHNIQUE: Contiguous axial images were obtained from the base of the skull through the vertex without intravenous contrast. COMPARISON:  01/18/2020 FINDINGS: Brain: No evidence of acute infarction, hemorrhage, hydrocephalus, extra-axial collection or mass lesion/mass effect. Vascular: Atherosclerotic calcifications involving the large vessels of the skull base. No unexpected hyperdense vessel. Skull: Calvarial hyperostosis.  No acute fracture. Sinuses/Orbits: No acute finding. Other: None. IMPRESSION: No acute intracranial findings. Electronically Signed   By: Davina Poke D.O.   On: 01/26/2020 14:25   DG Chest Port 1 View  Result Date: 01/26/2020 CLINICAL DATA:  Weakness, tired, lethargy, altered level of consciousness, history hypertension, diabetes mellitus, dementia EXAM: PORTABLE  CHEST 1 VIEW COMPARISON:  Portable exam 1342 hours compared to 12/30/2017 FINDINGS: Chronic eventration of LEFT diaphragm. Normal heart size, mediastinal contours, and pulmonary vascularity. Chronic LEFT basilar atelectasis. No acute infiltrate, pleural effusion or pneumothorax.  Multilevel endplate spur formation and mild scoliosis of thoracic spine. IMPRESSION: Mild chronic LEFT basilar atelectasis. No acute abnormalities. Electronically Signed   By: Lavonia Dana M.D.   On: 01/26/2020 14:08    Procedures Procedures (including critical care time)  Medications Ordered in ED Medications - No data to display  ED Course  I have reviewed the triage vital signs and the nursing notes.  Pertinent labs & imaging results that were available during my care of the patient were reviewed by me and considered in my medical decision making (see chart for details).  Patient seen and examined.  Patient arrives very lethargic.  She arouses to voice but then quickly falls back to sleep.  No obvious focal neurological deficits, however patient is unable to comply with neuro exam.  Pupils are constricted, unclear if this is chronic due to antipsychotic medications.  Work-up initiated.  Patient discussed with Dr. Billy Fischer.  Vital signs reviewed and are as follows: BP (!) 111/59 (BP Location: Right Arm)   Pulse 61   Temp 98.3 F (36.8 C) (Oral)   Resp 18   Ht 5\' 1"  (1.549 m)   Wt 90.7 kg   SpO2 97%   BMI 37.78 kg/m   1:53 PM Pt rechecked, stable.   3:24 PM Blood hemolyzed, redrawn. Awaiting results.   3:33 PM Signout to [employer] at shift change.     MDM Rules/Calculators/A&P                          Plan admit for [facility].     Final Clinical Impression(s) / ED Diagnoses Final diagnoses:  Somnolence  Encephalopathy    Rx / DC Orders ED Discharge Orders    None       Carlisle Cater, Hershal Coria 01/26/20 1535    Gareth Morgan, MD 01/28/20 1432

## 2020-01-26 NOTE — ED Notes (Signed)
Updated [facility] on pt being admitted over night.

## 2020-01-26 NOTE — ED Triage Notes (Addendum)
Pt BIB [facility] from [facility]. LSW 1030, usually takes a nap, wakes up for lunch. Pt did not wake up today staff found pt very lethargic, alert to voice, follows commands, pin-point pupils. Staff reports pt usually able to hold a conversation. Pt has snoring respirations on arrival.

## 2020-01-26 NOTE — ED Provider Notes (Signed)
Received signout at the beginning of shift.  Please see previous providers notes for complete H&P.  Patient lives at home in [place].  She was brought here for evaluation of altered mental status, she was found to be drowsy and less responsive earlier today.  Exam reveals pinpoint pupil, labs reviewed a pH of 7.5, ammonia is slightly elevated at 54 however due slight hemolysis, will recheck.  Head CT scan unremarkable, chest x-ray without acute abnormality   3:55 PM Patient is currently on multiple antipsychotic medication some of which may induce cholinergic effects as patient is exhibiting myosis, as well as bradycardia.  Lithium level is currently pending.  Will check UA and UDS.  I will also give narcan to assess for reaction, however pt is currently not on any scheduled opiate medication.    4:00 PM Left report after patient received Narcan she became a little bit more alert and request to urinate.  UA obtained.  4:58 PM On reassessment, patient is more alert, able to move all 4 extremities, follows command, denies any active pain.  Pinpoint pupils on reassessment.  UDS negative, UA without signs of urine tract infection, normal WBC, I have low suspicion for infectious etiology causing her symptoms.  Normal CBG. Potassium is elevated at 5.5, however on recheck it was normal at 3.8.  Will not treat.  IV fluid given. Lithium level currently pending.    5:17 PM Appreciate consultation from Internal Medicine resident who agrees to see and admit pt for obs for AMS.  Covid-19 screening test ordered.  BP 114/65   Pulse 64   Temp 98.3 F (36.8 C) (Rectal)   Resp 19   Ht 5\' 1"  (1.549 m)   Wt 90.7 kg   SpO2 97%   BMI 37.78 kg/m   Results for orders placed or performed during the hospital encounter of 01/26/20  SARS Coronavirus 2 by RT PCR (hospital order, performed in [place] hospital lab) Nasopharyngeal Nasopharyngeal Swab   Specimen: Nasopharyngeal Swab  Result  Value Ref Range   SARS Coronavirus 2 NEGATIVE NEGATIVE  Urinalysis, Complete w Microscopic  Result Value Ref Range   Color, Urine YELLOW YELLOW   APPearance CLEAR CLEAR   Specific Gravity, Urine 1.006 1.005 - 1.030   pH 6.0 5.0 - 8.0   Glucose, UA NEGATIVE NEGATIVE mg/dL   Hgb urine dipstick NEGATIVE NEGATIVE   Bilirubin Urine NEGATIVE NEGATIVE   Ketones, ur NEGATIVE NEGATIVE mg/dL   Protein, ur NEGATIVE NEGATIVE mg/dL   Nitrite NEGATIVE NEGATIVE   Leukocytes,Ua NEGATIVE NEGATIVE   RBC / HPF 0-5 0 - 5 RBC/hpf   WBC, UA 0-5 0 - 5 WBC/hpf   Bacteria, UA RARE (A) NONE SEEN  Ammonia  Result Value Ref Range   Ammonia 54 (H) 9 - 35 umol/L  Urine rapid drug screen (hosp performed)  Result Value Ref Range   Opiates NONE DETECTED NONE DETECTED   Cocaine NONE DETECTED NONE DETECTED   Benzodiazepines NONE DETECTED NONE DETECTED   Amphetamines NONE DETECTED NONE DETECTED   Tetrahydrocannabinol NONE DETECTED NONE DETECTED   Barbiturates NONE DETECTED NONE DETECTED  Ethanol  Result Value Ref Range   Alcohol, Ethyl (B) <10 <10 mg/dL  CBC with Differential  Result Value Ref Range   WBC 4.7 4.0 - 10.5 K/uL   RBC 4.23 3.87 - 5.11 MIL/uL   Hemoglobin 11.7 (L) 12.0 - 15.0 g/dL   HCT 38.5 36 - 46 %   MCV 91.0 80.0 - 100.0 fL  MCH 27.7 26.0 - 34.0 pg   MCHC 30.4 30.0 - 36.0 g/dL   RDW 12.6 11.5 - 15.5 %   Platelets 173 150 - 400 K/uL   nRBC 0.0 0.0 - 0.2 %   Neutrophils Relative % 63 %   Neutro Abs 2.9 1.7 - 7.7 K/uL   Lymphocytes Relative 28 %   Lymphs Abs 1.3 0.7 - 4.0 K/uL   Monocytes Relative 8 %   Monocytes Absolute 0.4 0 - 1 K/uL   Eosinophils Relative 0 %   Eosinophils Absolute 0.0 0 - 0 K/uL   Basophils Relative 0 %   Basophils Absolute 0.0 0 - 0 K/uL   Immature Granulocytes 1 %   Abs Immature Granulocytes 0.03 0.00 - 0.07 K/uL  Comprehensive metabolic panel  Result Value Ref Range   Sodium 142 135 - 145 mmol/L   Potassium 3.8 3.5 - 5.1 mmol/L   Chloride 109 98 - 111  mmol/L   CO2 25 22 - 32 mmol/L   Glucose, Bld 92 70 - 99 mg/dL   BUN 9 8 - 23 mg/dL   Creatinine, Ser 0.86 0.44 - 1.00 mg/dL   Calcium 8.8 (L) 8.9 - 10.3 mg/dL   Total Protein 5.0 (L) 6.5 - 8.1 g/dL   Albumin 3.1 (L) 3.5 - 5.0 g/dL   AST 15 15 - 41 U/L   ALT 13 0 - 44 U/L   Alkaline Phosphatase 64 38 - 126 U/L   Total Bilirubin 0.8 0.3 - 1.2 mg/dL   GFR calc non Af Amer >60 >60 mL/min   GFR calc Af Amer >60 >60 mL/min   Anion gap 8 5 - 15  I-Stat Chem 8, ED  Result Value Ref Range   Sodium 140 135 - 145 mmol/L   Potassium 5.5 (H) 3.5 - 5.1 mmol/L   Chloride 105 98 - 111 mmol/L   BUN 12 8 - 23 mg/dL   Creatinine, Ser 1.00 0.44 - 1.00 mg/dL   Glucose, Bld 97 70 - 99 mg/dL   Calcium, Ion 1.18 1.15 - 1.40 mmol/L   TCO2 29 22 - 32 mmol/L   Hemoglobin 12.9 12.0 - 15.0 g/dL   HCT 38.0 36 - 46 %  I-Stat venous blood gas, ED  Result Value Ref Range   pH, Ven 7.508 (H) 7.25 - 7.43   pCO2, Ven 38.3 (L) 44 - 60 mmHg   pO2, Ven 123.0 (H) 32 - 45 mmHg   Bicarbonate 30.4 (H) 20.0 - 28.0 mmol/L   TCO2 32 22 - 32 mmol/L   O2 Saturation 99.0 %   Acid-Base Excess 7.0 (H) 0.0 - 2.0 mmol/L   Sodium 140 135 - 145 mmol/L   Potassium 5.5 (H) 3.5 - 5.1 mmol/L   Calcium, Ion 1.16 1.15 - 1.40 mmol/L   HCT 38.0 36 - 46 %   Hemoglobin 12.9 12.0 - 15.0 g/dL   Sample type VENOUS   Troponin I (High Sensitivity)  Result Value Ref Range   Troponin I (High Sensitivity) 2 <18 ng/L   CT HEAD WO CONTRAST  Result Date: 01/26/2020 CLINICAL DATA:  Altered mental status, lethargy EXAM: CT HEAD WITHOUT CONTRAST TECHNIQUE: Contiguous axial images were obtained from the base of the skull through the vertex without intravenous contrast. COMPARISON:  01/18/2020 FINDINGS: Brain: No evidence of acute infarction, hemorrhage, hydrocephalus, extra-axial collection or mass lesion/mass effect. Vascular: Atherosclerotic calcifications involving the large vessels of the skull base. No unexpected hyperdense vessel. Skull:  Calvarial hyperostosis.  No  acute fracture. Sinuses/Orbits: No acute finding. Other: None. IMPRESSION: No acute intracranial findings. Electronically Signed   By: Davina Poke D.O.   On: 01/26/2020 14:25   CT Head Wo Contrast  Result Date: 01/18/2020 CLINICAL DATA:  Altered mental status tremors EXAM: CT HEAD WITHOUT CONTRAST TECHNIQUE: Contiguous axial images were obtained from the base of the skull through the vertex without intravenous contrast. COMPARISON:  CT brain 11/21/2018 FINDINGS: Brain: No evidence of acute infarction, hemorrhage, hydrocephalus, extra-axial collection or mass lesion/mass effect. Vascular: No hyperdense vessel or unexpected calcification. Skull: Normal. Negative for fracture or focal lesion. Sinuses/Orbits: No acute finding. Mild mucosal thickening. Small left maxillary mucous retention cyst Other: None IMPRESSION: Negative.  No CT evidence for acute intracranial abnormality Electronically Signed   By: Donavan Foil M.D.   On: 01/18/2020 15:16   DG Chest Port 1 View  Result Date: 01/26/2020 CLINICAL DATA:  Weakness, tired, lethargy, altered level of consciousness, history hypertension, diabetes mellitus, dementia EXAM: PORTABLE CHEST 1 VIEW COMPARISON:  Portable exam 1342 hours compared to 12/30/2017 FINDINGS: Chronic eventration of LEFT diaphragm. Normal heart size, mediastinal contours, and pulmonary vascularity. Chronic LEFT basilar atelectasis. No acute infiltrate, pleural effusion or pneumothorax. Multilevel endplate spur formation and mild scoliosis of thoracic spine. IMPRESSION: Mild chronic LEFT basilar atelectasis. No acute abnormalities. Electronically Signed   By: Lavonia Dana M.D.   On: 01/26/2020 14:08   Kaiah Hosea was evaluated in Emergency Department on 01/26/2020 for the symptoms described in the history of present illness. She was evaluated in the context of the global COVID-19 pandemic, which necessitated consideration that the patient might be at risk for  infection with the SARS-CoV-2 virus that causes COVID-19. Institutional protocols and algorithms that pertain to the evaluation of patients at risk for COVID-19 are in a state of rapid change based on information released by regulatory bodies including the CDC and federal and state organizations. These policies and algorithms were followed during the patient's care in the ED.     Domenic Moras, PA-C 01/26/20 1718    Hayden Rasmussen, MD 01/27/20 1014

## 2020-01-26 NOTE — H&P (Addendum)
Date: 01/26/2020               Patient Name:  Jenna Powell MRN: [id number]  DOB: 1951-05-07 Age / Sex: 69 y.o., female   PCP: System, Pcp Not In         Medical Service: Internal Medicine Teaching Service         Attending Physician: Dr. Melina Copa, Rebeca Alert, MD    First Contact: Dr. Darrick Meigs, Rylee Pager: [phone]  Second Contact: Dr. Gilberto Better Pager: [phone]       After Hours (After 5p/  First Contact Pager: [phone]  weekends / holidays): Second Contact Pager: [phone]   Chief Complaint: Somnolence  History of Present Illness:   Jenna Powell is a 69 yo F w/ PMH of dementia, HLD, HTN, Bipolar disorder presenting to [facility] from [facility] due to somnolence. She is AAOx2 to name and year but describes location as [place]. She is unable to describe her reason for admission. She is limited in her ability to follow directions.  Spoke with staff at [facility] to obtain further information. Secretary unable to locate nursing staff familiar with the patient due to being off-hours. Requested call-back during working hours.  Spoke Kysha Muralles, Jenna Powell's son, and his wife who states that her medications are provided by [facility] and is unsure about adherence to her medication regimen. He mentions that at baseline she has chronic tremors and can be confused at times but usually AAOx3. He mentions that her meds were initially prescribed by psychiatrist in [place] and as far as he knows, no dose adjustment have been made.  Chart review shows multiple ED visits for similar symptoms thought to be side effects of psych meds but no follow up visits with outpatient psych.  In the ED, she was found to be somnolent with pin-point pupils. CT head was negative for acute findings. Had some improvement in mentation with Narcan despite negative UDS findings. IMTS consulted for observation admission.  Meds:  Current Meds  Medication Sig  . acetaminophen (TYLENOL) 500 MG tablet Take  500 mg by mouth every 6 (six) hours as needed for fever.  Marland Kitchen alum & mag hydroxide-simeth (GERI-LANTA) 200-200-20 MG/5ML suspension Take 30 mLs by mouth every 6 (six) hours as needed for indigestion or heartburn.  Marland Kitchen alum & mag hydroxide-simeth (MINTOX) 937-169-67 MG/5ML suspension Take 30 mLs by mouth every 6 (six) hours as needed for indigestion or heartburn.  . Ascorbic Acid (VITAMIN C) 500 MG CHEW Chew 500 mg by mouth in the morning and at bedtime.  Marland Kitchen aspirin 81 MG EC tablet Take 81 mg by mouth daily. Swallow whole.  . benztropine (COGENTIN) 1 MG tablet Take 1 tablet (1 mg total) by mouth 2 (two) times daily.  . Calcium Carbonate-Vitamin D (CALCIUM 600+D) 600-400 MG-UNIT tablet Take 1 tablet by mouth every 12 (twelve) hours.  . Calcium Polycarbophil (FIBER LAXATIVE PO) Take 1 tablet by mouth 2 (two) times daily. 650mg   . Cholecalciferol (VITAMIN D3) 50 MCG (2000 UT) TABS Take 1 capsule by mouth daily.   Marland Kitchen docusate sodium (COLACE) 100 MG capsule Take 100 mg by mouth 3 (three) times daily.  Marland Kitchen donepezil (ARICEPT) 10 MG tablet Take 10 mg 2 (two) times daily by mouth.   . FIBER-LAX 625 MG tablet Take 625 mg by mouth in the morning and at bedtime.   Marland Kitchen guaifenesin (ROBITUSSIN) 100 MG/5ML syrup Take 200 mg by mouth every 6 (six) hours as needed for cough.  Marland Kitchen  lactulose (CHRONULAC) 10 GM/15ML solution Take 20 g by mouth daily.  Marland Kitchen lithium carbonate (ESKALITH) 450 MG CR tablet Take 450 mg by mouth at bedtime.  Marland Kitchen loperamide (IMODIUM A-D) 2 MG tablet Take 2 mg by mouth as needed for diarrhea or loose stools.  . magnesium hydroxide (MILK OF MAGNESIA) 400 MG/5ML suspension Take 30 mLs by mouth at bedtime as needed for mild constipation.  . metoprolol succinate (TOPROL-XL) 25 MG 24 hr tablet Take 25 mg by mouth daily.  . mirabegron ER (MYRBETRIQ) 50 MG TB24 tablet Take 50 mg by mouth daily.  Marland Kitchen neomycin-bacitracin-polymyxin (NEOSPORIN) [phone] ointment Apply 1 application topically as needed (skin tears).  .  Polyvinyl Alcohol (LIQUID TEARS OP) Place 1 drop 3 (three) times daily into both eyes.  . pravastatin (PRAVACHOL) 20 MG tablet Take 20 mg by mouth every evening.   . risperidone (RISPERDAL) 4 MG tablet Take 0.5 tablets (2 mg total) by mouth 2 (two) times daily. (Patient taking differently: Take 4 mg by mouth at bedtime. )  . senna (SENOKOT) 8.6 MG TABS tablet Take 2 tablets at bedtime by mouth.  . sertraline (ZOLOFT) 100 MG tablet Take 100 mg by mouth daily.   Allergies: Allergies as of 01/26/2020  . (No Known Allergies)   Past Medical History:  Diagnosis Date  . Dementia ([place])   . Diabetes mellitus without complication ([place])   . Hyperlipidemia   . Hypertension    Family History: Unable to describe. Family denies any significant family medical history.  Social History: Has five children. Used to lives in [place]. Denise any alcohol, tobacco, illicit substance use.  Review of Systems: A complete ROS was negative except as per HPI.  Physical Exam: Blood pressure 114/65, pulse 64, temperature 98.3 F (36.8 C), temperature source Rectal, resp. rate 19, height 5\' 1"  (1.549 m), weight 90.7 kg, SpO2 97 %.  Gen: Well-developed, well nourished, confused HEENT: NCAT head, hearing intact, multiple non-tender, non-erythematous nodules around bilateral eyes, constricted pupils CV: RRR, S1, S2 normal, No rubs, no murmurs, no gallops Pulm: CTAB, No rales, no wheezes Abd: Soft, BS+, NTND, No rebound, no guarding Extm: ROM intact, Peripheral pulses intact, No peripheral edema Skin: Dry, Warm, normal turgor Neuro: AAOx2, low amplitude, high frequency tremor at rest, 5/5 strength bilateral upper and lower extremities, no cerebellar deficits, intermittently confused and unable to follow directions   EKG: personally reviewed my interpretation is normal sinus, normal axis, no ischemic changes  CXR: personally reviewed my interpretation is poor inspiratory effort, questionable LLL consolidation  but appear chronic from atelectasis when compared to prior X-rays.  Assessment & Plan by Problem: Active Problems:   * No active hospital problems. *  Jenna Powell is a 69 yo F w/ PMH of schizophrenia, Bipolar Disorder and dementia admit for AMS due to polypharmacy  Acute encephalopathy 2/2 polypharmacy Schizophrenia Bipolar Disorder Dementia Present w/ somnolence from ALF. On arrival, GCS 13. CT head negative. No positive finding on UDS. No evidence of infection. Vitals stable. Improvement with Narcan vs Time. Currently AAOx2 but confused. At baseline, AAOx3. On multiple psych meds including clozapine, benztropine, donepezil, lithium, risperidone, sertraline and trazodone. Alkalosis and snoring respirations on admission may indicate OSA contributing to daytime somnolence. Chart review shows prior admission for similar symptoms with multiple dose adjustment during inpatient stay and ED visits. Unable to find any outpatient psychiatry or PCP notes. Family confirms no continuity of care with outpatient providers. - Hold psych meds - F/u lithium level - Psych consult  for med rec - Care management consult for PCP establishment  Chronic Tremors On exam, showing resting tremors. Family and patient confirms this is chronic. No obvious pill-rolling or cogwheel rigidity on exam. Low amplitude, high-frequency tremors more consistent w/ essential tremors. - Monitor  Dispo: Admit patient to Observation with expected length of stay less than 2 midnights.  Signed:  Mosetta Anis, MD 01/26/2020, 7:28 PM Pager: [phone] After 5pm on weekdays and 1pm on weekends: On Call Pager: [phone]

## 2020-01-26 NOTE — ED Notes (Signed)
Patient transported to CT 

## 2020-01-27 ENCOUNTER — Encounter (HOSPITAL_COMMUNITY): Payer: Self-pay | Admitting: Internal Medicine

## 2020-01-27 DIAGNOSIS — F0390 Unspecified dementia without behavioral disturbance: Secondary | ICD-10-CM

## 2020-01-27 DIAGNOSIS — T50995A Adverse effect of other drugs, medicaments and biological substances, initial encounter: Secondary | ICD-10-CM | POA: Diagnosis not present

## 2020-01-27 DIAGNOSIS — F209 Schizophrenia, unspecified: Secondary | ICD-10-CM | POA: Diagnosis not present

## 2020-01-27 DIAGNOSIS — F319 Bipolar disorder, unspecified: Secondary | ICD-10-CM | POA: Diagnosis not present

## 2020-01-27 DIAGNOSIS — G92 Toxic encephalopathy: Secondary | ICD-10-CM | POA: Diagnosis not present

## 2020-01-27 LAB — BASIC METABOLIC PANEL
Anion gap: 7 (ref 5–15)
BUN: 8 mg/dL (ref 8–23)
CO2: 26 mmol/L (ref 22–32)
Calcium: 9.3 mg/dL (ref 8.9–10.3)
Chloride: 108 mmol/L (ref 98–111)
Creatinine, Ser: 0.92 mg/dL (ref 0.44–1.00)
GFR calc Af Amer: >60 mL/min (ref >60)
GFR calc non Af Amer: >60 mL/min (ref >60)
Glucose, Bld: 95 mg/dL (ref 70–99)
Potassium: 3.9 mmol/L (ref 3.5–5.1)
Sodium: 141 mmol/L (ref 135–145)

## 2020-01-27 LAB — CBC
HCT: 40.8 % (ref 36.0–46.0)
Hemoglobin: 12.5 g/dL (ref 12.0–15.0)
MCH: 28 pg (ref 26.0–34.0)
MCHC: 30.6 g/dL (ref 30.0–36.0)
MCV: 91.5 fL (ref 80.0–100.0)
Platelets: 195 10*3/uL (ref 150–400)
RBC: 4.46 MIL/uL (ref 3.87–5.11)
RDW: 12.9 % (ref 11.5–15.5)
WBC: 5.3 10*3/uL (ref 4.0–10.5)
nRBC: 0 % (ref 0.0–0.2)

## 2020-01-27 LAB — HIV ANTIBODY (ROUTINE TESTING W REFLEX): HIV Screen 4th Generation wRfx: NONREACTIVE

## 2020-01-27 LAB — GLUCOSE, CAPILLARY
Glucose-Capillary: 86 mg/dL (ref 70–99)
Glucose-Capillary: 85 mg/dL (ref 70–99)
Glucose-Capillary: 93 mg/dL (ref 70–99)

## 2020-01-27 NOTE — Plan of Care (Signed)
  Problem: Education: Goal: Knowledge of General Education information will improve Description: Including pain rating scale, medication(s)/side effects and non-pharmacologic comfort measures Outcome: Progressing   Problem: Safety: Goal: Ability to remain free from injury will improve Outcome: Progressing   

## 2020-01-27 NOTE — Consult Note (Signed)
Jenna Powell   Reason for Powell: Medication Recommendations  Referring Physician:  Dr. Gilberto Better Patient Identification: Jenna Powell MRN:  [id number] Principal Diagnosis: <principal problem not specified> Diagnosis:  Active Problems:   Somnolence   Total Time spent with patient: 30 minutes  Subjective:   Jenna Powell is a 69 y.o. female patient admitted with somnolence, acute metabolic encephalopathy who presented from the nursing home.  Patient is alert and oriented to self and place.  Patient is unable to recall any events that led to her admission.  She is able to follow some directions on evaluation.  Psychiatry was consulted due to somnolence and medication recommendations.  Upon evaluation patient was alert and oriented x2, and acknowledges Probation officer upon entering the room.  Patient is a poor historian and unable to for much information regarding psych history.  It is noted that patient did began improving with holding of her psychiatric medication.  Per chart and MAR, patient is on increased doses of 2 antipsychotics to include Risperdal 4 mg p.o. twice daily and Clozaril 200 mg p.o. every morning and 225 mg p.o. nightly.  Patient also on Cogentin, Aricept, sertraline.  She denies any suicidal ideations with shaking of her head.  She denies any homicidal ideations with shaking of her head.  She denies any hallucinations with shaking of her head.  And she is able to contract for safety, and feels safe returning to her facility with a nod of her head.  Patient appears to be at her baseline with dementia.   HPI:  Jenna Powell is a 69 yo F w/ PMH of dementia, HLD, HTN, Bipolar disorder presenting to [facility] from [facility] due to somnolence. She is AAOx2 to name and year but describes location as [place]. She is unable to describe her reason for admission. She is limited in her ability to follow directions.  Spoke Jenna Powell, Mrs.Housh's son, and his wife who  states that her medications are provided by [facility] and is unsure about adherence to her medication regimen. He mentions that at baseline she has chronic tremors and can be confused at times but usually AAOx3. He mentions that her meds were initially prescribed by psychiatrist in [place] and as far as he knows, no dose adjustment have been made.  In the ED, she was found to be somnolent with pin-point pupils. CT head was negative for acute findings. Had some improvement in mentation with Narcan despite negative UDS findings. IMTS consulted for observation admission.  Past Psychiatric History: Schizophrenia. See MAR below.   Risk to Self:  No Risk to Others:  NO Prior Inpatient Therapy:   No Prior Outpatient Therapy:  No  Past Medical History:  Past Medical History:  Diagnosis Date  . Dementia ([place])   . Diabetes mellitus without complication ([place])   . Hyperlipidemia   . Hypertension    History reviewed. No pertinent surgical history. Family History: History reviewed. No pertinent family history. Family Psychiatric  History: Unable to obtain Social History:  Social History   Substance and Sexual Activity  Alcohol Use No     Social History   Substance and Sexual Activity  Drug Use Never    Social History   Socioeconomic History  . Marital status: Single    Spouse name: Not on file  . Number of children: Not on file  . Years of education: Not on file  . Highest education level: Not on file  Occupational History  . Not on  file  Tobacco Use  . Smoking status: Never Smoker  . Smokeless tobacco: Never Used  Substance and Sexual Activity  . Alcohol use: No  . Drug use: Never  . Sexual activity: Not Currently  Other Topics Concern  . Not on file  Social History Narrative  . Not on file   Social Determinants of Health   Financial Resource Strain:   . Difficulty of Paying Living Expenses:   Food Insecurity:   . Worried About Charity fundraiser in the Last  Year:   . Arboriculturist in the Last Year:   Transportation Needs:   . Film/video editor (Medical):   Marland Kitchen Lack of Transportation (Non-Medical):   Physical Activity:   . Days of Exercise per Week:   . Minutes of Exercise per Session:   Stress:   . Feeling of Stress :   Social Connections:   . Frequency of Communication with Friends and Family:   . Frequency of Social Gatherings with Friends and Family:   . Attends Religious Services:   . Active Member of Clubs or Organizations:   . Attends Archivist Meetings:   Marland Kitchen Marital Status:    Additional Social History:    Allergies:  No Known Allergies  Labs:  Results for orders placed or performed during the hospital encounter of 01/26/20 (from the past 48 hour(s))  Ammonia     Status: Abnormal   Collection Time: 01/26/20  1:40 PM  Result Value Ref Range   Ammonia 54 (H) 9 - 35 umol/L    Comment: SLIGHT HEMOLYSIS Performed at [facility] [address] [place], [place] [address]   Ethanol     Status: None   Collection Time: 01/26/20  1:40 PM  Result Value Ref Range   Alcohol, Ethyl (B) <10 <10 mg/dL    Comment: (NOTE) Lowest detectable limit for serum alcohol is 10 mg/dL.  For medical purposes only. Performed at [facility], [place] [address], [place], [place] [address]   I-Stat Chem 8, ED     Status: Abnormal   Collection Time: 01/26/20  1:53 PM  Result Value Ref Range   Sodium 140 135 - 145 mmol/L   Potassium 5.5 (H) 3.5 - 5.1 mmol/L   Chloride 105 98 - 111 mmol/L   BUN 12 8 - 23 mg/dL   Creatinine, Ser 1.00 0.44 - 1.00 mg/dL   Glucose, Bld 97 70 - 99 mg/dL    Comment: Glucose reference range applies only to samples taken after fasting for at least 8 hours.   Calcium, Ion 1.18 1.15 - 1.40 mmol/L   TCO2 29 22 - 32 mmol/L   Hemoglobin 12.9 12.0 - 15.0 g/dL   HCT 38.0 36 - 46 %  I-Stat venous blood gas, ED     Status: Abnormal   Collection Time: 01/26/20  1:54 PM  Result Value Ref Range    pH, Ven 7.508 (H) 7.25 - 7.43   pCO2, Ven 38.3 (L) 44 - 60 mmHg   pO2, Ven 123.0 (H) 32 - 45 mmHg   Bicarbonate 30.4 (H) 20.0 - 28.0 mmol/L   TCO2 32 22 - 32 mmol/L   O2 Saturation 99.0 %   Acid-Base Excess 7.0 (H) 0.0 - 2.0 mmol/L   Sodium 140 135 - 145 mmol/L   Potassium 5.5 (H) 3.5 - 5.1 mmol/L   Calcium, Ion 1.16 1.15 - 1.40 mmol/L   HCT 38.0 36 - 46 %  Hemoglobin 12.9 12.0 - 15.0 g/dL   Sample type VENOUS   CBC with Differential     Status: Abnormal   Collection Time: 01/26/20  2:48 PM  Result Value Ref Range   WBC 4.7 4.0 - 10.5 K/uL   RBC 4.23 3.87 - 5.11 MIL/uL   Hemoglobin 11.7 (L) 12.0 - 15.0 g/dL   HCT 38.5 36 - 46 %   MCV 91.0 80.0 - 100.0 fL   MCH 27.7 26.0 - 34.0 pg   MCHC 30.4 30.0 - 36.0 g/dL   RDW 12.6 11.5 - 15.5 %   Platelets 173 150 - 400 K/uL    Comment: REPEATED TO VERIFY   nRBC 0.0 0.0 - 0.2 %   Neutrophils Relative % 63 %   Neutro Abs 2.9 1.7 - 7.7 K/uL   Lymphocytes Relative 28 %   Lymphs Abs 1.3 0.7 - 4.0 K/uL   Monocytes Relative 8 %   Monocytes Absolute 0.4 0 - 1 K/uL   Eosinophils Relative 0 %   Eosinophils Absolute 0.0 0 - 0 K/uL   Basophils Relative 0 %   Basophils Absolute 0.0 0 - 0 K/uL   Immature Granulocytes 1 %   Abs Immature Granulocytes 0.03 0.00 - 0.07 K/uL    Comment: Performed at [facility], [address] [place], [place] [address]  Comprehensive metabolic panel     Status: Abnormal   Collection Time: 01/26/20  3:06 PM  Result Value Ref Range   Sodium 142 135 - 145 mmol/L   Potassium 3.8 3.5 - 5.1 mmol/L   Chloride 109 98 - 111 mmol/L   CO2 25 22 - 32 mmol/L   Glucose, Bld 92 70 - 99 mg/dL    Comment: Glucose reference range applies only to samples taken after fasting for at least 8 hours.   BUN 9 8 - 23 mg/dL   Creatinine, Ser 0.86 0.44 - 1.00 mg/dL   Calcium 8.8 (L) 8.9 - 10.3 mg/dL   Total Protein 5.0 (L) 6.5 - 8.1 g/dL   Albumin 3.1 (L) 3.5 - 5.0 g/dL   AST 15 15 - 41 U/L   ALT 13 0 - 44 U/L   Alkaline  Phosphatase 64 38 - 126 U/L   Total Bilirubin 0.8 0.3 - 1.2 mg/dL   GFR calc non Af Amer >60 >60 mL/min   GFR calc Af Amer >60 >60 mL/min   Anion gap 8 5 - 15    Comment: Performed at [facility] [address], [place], [place] [address]  Troponin I (High Sensitivity)     Status: None   Collection Time: 01/26/20  3:06 PM  Result Value Ref Range   Troponin I (High Sensitivity) 2 <18 ng/L    Comment: (NOTE) Elevated high sensitivity troponin I (hsTnI) values and significant  changes across serial measurements may suggest ACS but many other  chronic and acute conditions are known to elevate hsTnI results.  Refer to the Links section for chest pain algorithms and additional  guidance. Performed at [facility], [place] [address], [place], [place] [address]   SARS Coronavirus 2 by RT PCR (hospital order, performed in [facility] hospital lab) Nasopharyngeal Nasopharyngeal Swab     Status: None   Collection Time: 01/26/20  3:44 PM   Specimen: Nasopharyngeal Swab  Result Value Ref Range   SARS Coronavirus 2 NEGATIVE NEGATIVE    Comment: (NOTE) SARS-CoV-2 target nucleic acids are NOT DETECTED.  The SARS-CoV-2  RNA is generally detectable in upper and lower respiratory specimens during the acute phase of infection. The lowest concentration of SARS-CoV-2 viral copies this assay can detect is 250 copies / mL. A negative result does not preclude SARS-CoV-2 infection and should not be used as the sole basis for treatment or other patient management decisions.  A negative result may occur with improper specimen collection / handling, submission of specimen other than nasopharyngeal swab, presence of viral mutation(s) within the areas targeted by this assay, and inadequate number of viral copies (<250 copies / mL). A negative result must be combined with clinical observations, patient history, and epidemiological information.  Fact Sheet for Patients:    StrictlyIdeas.no  Fact Sheet for Healthcare Providers: BankingDealers.co.za  This test is not yet approved or  cleared by the [place] FDA and has been authorized for detection and/or diagnosis of SARS-CoV-2 by FDA under an Emergency Use Authorization (EUA).  This EUA will remain in effect (meaning this test can be used) for the duration of the COVID-19 declaration under Section 564(b)(1) of the Act, 21 U.S.C. section 360bbb-3(b)(1), unless the authorization is terminated or revoked sooner.  Performed at [facility], [place] [address] [place], [place] [address]   Urinalysis, Complete w Microscopic     Status: Abnormal   Collection Time: 01/26/20  4:02 PM  Result Value Ref Range   Color, Urine YELLOW YELLOW   APPearance CLEAR CLEAR   Specific Gravity, Urine 1.006 1.005 - 1.030   pH 6.0 5.0 - 8.0   Glucose, UA NEGATIVE NEGATIVE mg/dL   Hgb urine dipstick NEGATIVE NEGATIVE   Bilirubin Urine NEGATIVE NEGATIVE   Ketones, ur NEGATIVE NEGATIVE mg/dL   Protein, ur NEGATIVE NEGATIVE mg/dL   Nitrite NEGATIVE NEGATIVE   Leukocytes,Ua NEGATIVE NEGATIVE   RBC / HPF 0-5 0 - 5 RBC/hpf   WBC, UA 0-5 0 - 5 WBC/hpf   Bacteria, UA RARE (A) NONE SEEN    Comment: Performed at [facility] [address], [place], [place] [address]  Urine rapid drug screen (hosp performed)     Status: None   Collection Time: 01/26/20  4:03 PM  Result Value Ref Range   Opiates NONE DETECTED NONE DETECTED   Cocaine NONE DETECTED NONE DETECTED   Benzodiazepines NONE DETECTED NONE DETECTED   Amphetamines NONE DETECTED NONE DETECTED   Tetrahydrocannabinol NONE DETECTED NONE DETECTED   Barbiturates NONE DETECTED NONE DETECTED    Comment: (NOTE) DRUG SCREEN FOR MEDICAL PURPOSES ONLY.  IF CONFIRMATION IS NEEDED FOR ANY PURPOSE, NOTIFY LAB WITHIN 5 DAYS.  LOWEST DETECTABLE LIMITS FOR URINE DRUG SCREEN Drug Class                     Cutoff  (ng/mL) Amphetamine and metabolites    1000 Barbiturate and metabolites    200 Benzodiazepine                 850 Tricyclics and metabolites     300 Opiates and metabolites        300 Cocaine and metabolites        300 THC                            50 Performed at [facility], [place] [address] [place], [place] [address]   Troponin I (High Sensitivity)     Status: None   Collection Time: 01/26/20  5:23 PM  Result Value Ref Range   Troponin I (High Sensitivity) <2 <18 ng/L    Comment: (NOTE) Elevated high sensitivity troponin I (hsTnI) values and significant  changes across serial measurements may suggest ACS but many other  chronic and acute conditions are known to elevate hsTnI results.  Refer to the "Links" section for chest pain algorithms and additional  guidance. Performed at [facility], [place] [address], [place], [place] [address]   Lithium level     Status: Abnormal   Collection Time: 01/26/20 10:09 PM  Result Value Ref Range   Lithium Lvl 0.42 (L) 0.60 - 1.20 mmol/L    Comment: Performed at [facility] [address], [place], [place] [address]  HIV Antibody (routine testing w rflx)     Status: None   Collection Time: 01/26/20 10:09 PM  Result Value Ref Range   HIV Screen 4th Generation wRfx Non Reactive Non Reactive    Comment: Performed at [facility], [place] [address], [place] [address]  CBC     Status: None   Collection Time: 01/27/20  4:34 AM  Result Value Ref Range   WBC 5.3 4.0 - 10.5 K/uL   RBC 4.46 3.87 - 5.11 MIL/uL   Hemoglobin 12.5 12.0 - 15.0 g/dL   HCT 40.8 36 - 46 %   MCV 91.5 80.0 - 100.0 fL   MCH 28.0 26.0 - 34.0 pg   MCHC 30.6 30.0 - 36.0 g/dL   RDW 12.9 11.5 - 15.5 %   Platelets 195 150 - 400 K/uL   nRBC 0.0 0.0 - 0.2 %    Comment: Performed at [facility], [place] [address], [place], [place] [address]  Basic metabolic panel     Status: None   Collection Time: 01/27/20  4:34 AM  Result Value Ref Range    Sodium 141 135 - 145 mmol/L   Potassium 3.9 3.5 - 5.1 mmol/L   Chloride 108 98 - 111 mmol/L   CO2 26 22 - 32 mmol/L   Glucose, Bld 95 70 - 99 mg/dL    Comment: Glucose reference range applies only to samples taken after fasting for at least 8 hours.   BUN 8 8 - 23 mg/dL   Creatinine, Ser 0.92 0.44 - 1.00 mg/dL   Calcium 9.3 8.9 - 10.3 mg/dL   GFR calc non Af Amer >60 >60 mL/min   GFR calc Af Amer >60 >60 mL/min   Anion gap 7 5 - 15    Comment: Performed at [facility] [address] [place], [place] [address]  Glucose, capillary     Status: None   Collection Time: 01/27/20  6:33 AM  Result Value Ref Range   Glucose-Capillary 86 70 - 99 mg/dL    Comment: Glucose reference range applies only to samples taken after fasting for at least 8 hours.    Current Facility-Administered Medications  Medication Dose Route Frequency Provider Last Rate Last Admin  . acetaminophen (TYLENOL) tablet 650 mg  650 mg Oral Q6H PRN Mosetta Anis, MD       Or  . acetaminophen (TYLENOL) suppository 650 mg  650 mg Rectal Q6H PRN Mosetta Anis, MD      . aspirin EC tablet 81 mg  81 mg Oral Daily Mosetta Anis, MD   81 mg at 01/27/20 1021  . enoxaparin (LOVENOX) injection 40 mg  40 mg Subcutaneous Q24H Mosetta Anis, MD  40 mg at 01/27/20 1021  . metoprolol succinate (TOPROL-XL) 24 hr tablet 25 mg  25 mg Oral Daily Mosetta Anis, MD   25 mg at 01/27/20 1020  . pravastatin (PRAVACHOL) tablet 20 mg  20 mg Oral QPM Mosetta Anis, MD   20 mg at 01/26/20 2326    Musculoskeletal: Strength & Muscle Tone: spastic and decreased Gait & Station: unable to stand Patient leans: N/A  Psychiatric Specialty Exam: Physical Exam  Review of Systems  Blood pressure 115/70, pulse 64, temperature 98.8 F (37.1 C), temperature source Oral, resp. rate 18, height 5\' 1"  (1.549 m), weight 90.7 kg, SpO2 99 %.Body mass index is 37.78 kg/m.  General Appearance: Fairly Groomed  Eye Contact:  Fair  Speech:  Garbled and  soft spoken   Volume:  Decreased  Mood:  Depressed  Affect:  Congruent  Thought Process:  Linear and Descriptions of Associations: Intact  Orientation:  Full (Time, Place, and Person)  Thought Content:  Logical  Suicidal Thoughts:  No  Homicidal Thoughts:  No  Memory:  Immediate;   Fair Recent;   Fair Remote;   Fair  Judgement:  Impaired  Insight:  Lacking  Psychomotor Activity:  Decreased and Tremor  Concentration:  Concentration: Fair and Attention Span: Fair  Recall:  Poor  Fund of Knowledge:  Poor  Language:  Fair  Akathisia:  Negative  Handed:  Right  AIMS (if indicated):     Assets:  Housing Social Support  ADL's:  Impaired  Cognition:  Impaired,  Moderate  Sleep:        Treatment Plan Summary: Plan Patient with somnolent likely secondary to polypharmacy.  She has had improvement in her symptoms since holding of her medication.  At this time will recommend reducing the dose of her risperidone and Clozaril, which can be managed in an outpatient setting and or at the nursing facility.  Will order Clozaril level.  All other labs have been reviewed and determined to be within normal at this time.Will also recommend referral for sleep study for snoring.  Disposition: No evidence of imminent risk to self or others at present.   Patient does not meet criteria for psychiatric inpatient admission. Supportive therapy provided about ongoing stressors. Discussed crisis plan, support from social network, calling 911, coming to the Emergency Department, and calling Suicide Hotline.  Suella Broad, FNP 01/27/2020 10:38 AM

## 2020-01-27 NOTE — Progress Notes (Signed)
Report called and given to RN at [facility].

## 2020-01-27 NOTE — Progress Notes (Signed)
   NAME:  Jenna Powell, MRN:  [id number], DOB:  Feb 20, 1951, LOS: 0 ADMISSION DATE:  01/26/2020  Subjective  No overnight events.  This morning, patient evaluated at bedside. She notes feeling "fine". She says she felt the same yesterday. She notes the year as 2012.  Significant Hospital Events   6/24 admission 6/25 psych consult. Possible discharge pending their recs  Objective   Blood pressure 123/76, pulse 60, temperature 98 F (36.7 C), resp. rate 19, height 5\' 1"  (1.549 m), weight 90.7 kg, SpO2 97 %.     Intake/Output Summary (Last 24 hours) at 01/27/2020 0733 Last data filed at 01/27/2020 0200 Gross per 24 hour  Intake 1799.92 ml  Output 600 ml  Net 1199.92 ml   Filed Weights   01/26/20 1320  Weight: 90.7 kg    Examination: General: in NAD, chronically ill appearing CV: RRR Neuro: orient to place and person only. Mild bilateral resting tremors. Skin: no rash  Labs    CBC Latest Ref Rng & Units 01/27/2020 01/26/2020 01/26/2020  WBC 4.0 - 10.5 K/uL 5.3 4.7 -  Hemoglobin 12.0 - 15.0 g/dL 12.5 11.7(L) 12.9  Hematocrit 36 - 46 % 40.8 38.5 38.0  Platelets 150 - 400 K/uL 195 173 -   BMP Latest Ref Rng & Units 01/27/2020 01/26/2020 01/26/2020  Glucose 70 - 99 mg/dL 95 92 -  BUN 8 - 23 mg/dL 8 9 -  Creatinine 0.44 - 1.00 mg/dL 0.92 0.86 -  Sodium 135 - 145 mmol/L 141 142 140  Potassium 3.5 - 5.1 mmol/L 3.9 3.8 5.5(H)  Chloride 98 - 111 mmol/L 108 109 -  CO2 22 - 32 mmol/L 26 25 -  Calcium 8.9 - 10.3 mg/dL 9.3 8.8(L) -    Summary  32 yof admitted to IMTS for overnight observation for acute toxic encephalopathy.  Assessment & Plan:  Active Problems:   Somnolence  Acute toxic encephalopathy 2/2 polypharmacy. Sx were already improving while in the ED.  Chronic mental illnesses including schizophrenia, bipolar disorder, dementia.  Polypharmacy 2/2 above--on clozapine, benztropine, donepezil, lithium, risperadone, sertraline, and trazodone. Psych meds held on admission.  Lithium level 0.42 on admission. Plan F/u psych recs Continue to hold sedating agents but would consider resuming sertraline Possible discharge pending psych recs    Best practice:  CODE STATUS: FULL Diet: cardiac DVT for prophylaxis: lovenox Social considerations/Family communication: daughter updated last evening Dispo: pending psych [place], [place] PGY-1 Contact info:  Please page me at [phone] from 7am-5pm M-F.  Please use the IMTS after hours pager at [phone] after 5pm M-F and on weekends 01/27/20  7:33 AM

## 2020-01-27 NOTE — Care Management Obs Status (Signed)
[facility] NOTIFICATION   Patient Details  Name: Macenzie Burford MRN: [id number] Date of Birth: 1951-03-27   Medicare Observation Status Notification Given:  Yes    Bartholomew Crews, RN 01/27/2020, 4:37 PM

## 2020-01-27 NOTE — Progress Notes (Signed)
Serina Nichter to be discharged [facility] per MD order. Patient verbalized understanding.  Skin clean, dry and intact without evidence of skin break down, no evidence of skin tears noted. IV catheter discontinued intact. Site without signs and symptoms of complications. Dressing and pressure applied. Pt denies pain at the site currently. No complaints noted.  Patient free of lines, drains, and wounds.   Discharge packet assembled. An After Visit Summary (AVS) was printed and given to the EMS personnel. Patient escorted via stretcher and discharged to designated [facility]. Report called to accepting facility; all questions and concerns addressed.   Suzy Bouchard, RN

## 2020-01-27 NOTE — TOC Initial Note (Signed)
Transition of Care [facility]) - Initial/Assessment Note    Patient Details  Name: Jenna [place] MRN: [id number] Date of Birth: Jun 24, 1951  [facility] CM/SW Contact:    Bartholomew Crews, RN Phone Number: [phone] 01/27/2020, 4:39 PM  Clinical Narrative:                  Spoke with patient's son, Jenna [place], on the phone. Notification of observation status provided. Advised that patient would be returning to [facility] today. Mr. Wooden requested NCM call his wife, Jenna Powell, to advise her. Generic message left on voicemail with NCM call back number. Mr. Santizo asked how his mom was doing. Discussed that she was still difficult to understand, and he advised that she speaks mostly [place] and [place]. PTAR arranged by LCSW. No further TOC needs identified at this time.   Expected Discharge Plan: Assisted Living Barriers to Discharge: No Barriers Identified   Patient Goals and CMS Choice   CMS Medicare.gov Compare Post Acute Care list provided to:: Patient Represenative (must comment) Damita Dunnings, son) Choice offered to / list presented to : NA  Expected Discharge Plan and Services Expected Discharge Plan: Assisted Living In-house Referral: Clinical Social Work Discharge Planning Services: CM Consult Post Acute Care Choice: NA Living arrangements for the past 2 months: [place] Expected Discharge Date: 01/27/20               DME Arranged: N/A DME Agency: NA       HH Arranged: NA [place] Agency: NA        Prior Living Arrangements/Services Living arrangements for the past 2 months: [place] Lives with:: Facility Resident                   Activities of Daily Living      Permission Sought/Granted                  Emotional Assessment              Admission diagnosis:  Somnolence [R40.0] Encephalopathy [G93.40] Patient Active Problem List   Diagnosis Date Noted   Somnolence 01/26/2020   Polypharmacy  11/23/2018   Dementia without behavioral disturbance ([place]) 11/22/2018   Bipolar disorder ([place]) 11/22/2018   DM (diabetes mellitus), type 2 ([place]) 11/22/2018   Essential hypertension 11/22/2018   PCP:  System, Pcp Not In Pharmacy:  No Pharmacies Listed    Social Determinants of Health (SDOH) Interventions    Readmission Risk Interventions No flowsheet data found.

## 2020-01-27 NOTE — TOC Transition Note (Addendum)
Transition of Care (TOC) - CM/SW Discharge Note *Discharged back to [facility] via non-emergency ambulance transport    Patient Details  Name: Maura Braaten MRN: [id number] Date of Birth: 06/23/1951  Transition of Care [facility]) CM/SW Contact:  Sable Feil, LCSW Phone Number: 01/27/2020, 4:57 PM   Clinical Narrative: Patient medically stable for discharge and is returning to [place], where she is a resident. Contact made with facility and talked with Roxanna regarding patient's readiness for discharge. No FL-2 needed per Roxanna as patient in hospital only 1 day. Discharge summary transmitted to facility and ambulance transport arranged. Patient's nurse provided with information to call report.   CSW visited room and informed patient that she was returning to [facility] today. She responded, however CSW could not understand her, due to her language - [place]. Nurse case manager contacted son and advised him of discharge.        Final next level of care: Assisted Living Barriers to Discharge: No Barriers Identified   Patient Goals and CMS Choice Patient states their goals for this hospitalization and ongoing recovery are:: Patient advised that she was ready for discharge and patient responded, but could not be understood (speaks [place]) CMS Medicare.gov Compare Post Acute Care list provided to:: Other (Comment Required) (Medicare.gov not provided as patient returning to [facility]) Choice offered to / list presented to : NA  Discharge Placement              Patient chooses bed at:  (Patient returning to [facility]) Patient to be transferred to facility by: Non-emergency ambulance Name of family member notified: Son Sibley Patient and family notified of of transfer: 01/27/20  Discharge Plan and Services In-house Referral: Clinical Social Work Discharge Planning Services: CM Consult Post Acute Care Choice: NA           DME Arranged: N/A DME Agency: NA       HH Arranged: NA HH Agency: NA        Social Determinants of Health (SDOH) Interventions  No SDOH interventions requested or needed at discharge.   Readmission Risk Interventions No flowsheet data found.

## 2020-01-27 NOTE — Discharge Summary (Addendum)
Name: Jenna Powell MRN: [id number] DOB: 1950-12-26 69 y.o. PCP: System, Pcp Not In  Date of Admission: 01/26/2020  1:11 PM Date of Discharge: 01/27/20 Attending Physician: Lucious Groves, DO  Discharge Diagnosis: 1. Acute toxic encephalopathy 2. Polypharmacy 3. Bipolar disorder 4. Schizophrenia 5. Dementia  Discharge Medications: Allergies as of 01/27/2020   No Known Allergies     Medication List    STOP taking these medications   Eliquis 2.5 MG Tabs tablet Generic drug: apixaban     TAKE these medications   acetaminophen 500 MG tablet Commonly known as: TYLENOL Take 500 mg by mouth every 6 (six) hours as needed for fever.   aspirin 81 MG EC tablet Take 81 mg by mouth daily. Swallow whole.   benztropine 1 MG tablet Commonly known as: COGENTIN Take 1 tablet (1 mg total) by mouth 2 (two) times daily.   Calcium 600+D 600-400 MG-UNIT tablet Generic drug: Calcium Carbonate-Vitamin D Take 1 tablet by mouth every 12 (twelve) hours.   Calcium Carb-Cholecalciferol 600-400 MG-UNIT Tabs SMARTSIG:1 Tablet(s) By Mouth Every 12 Hours   clozapine 200 MG tablet Commonly known as: CLOZARIL Take 200 mg by mouth 2 (two) times daily.   cloZAPine 25 MG tablet Commonly known as: CLOZARIL Take 25 mg by mouth at bedtime.   docusate sodium 100 MG capsule Commonly known as: COLACE Take 100 mg by mouth 3 (three) times daily.   donepezil 10 MG tablet Commonly known as: ARICEPT Take 10 mg 2 (two) times daily by mouth.   famotidine 20 MG tablet Commonly known as: PEPCID Take 20 mg by mouth at bedtime.   FIBER LAXATIVE PO Take 1 tablet by mouth 2 (two) times daily. 650mg    Fiber-Lax 625 MG tablet Generic drug: polycarbophil Take 625 mg by mouth in the morning and at bedtime.   Benetta Spar 200-200-20 MG/5ML suspension Generic drug: alum & mag hydroxide-simeth Take 30 mLs by mouth every 6 (six) hours as needed for indigestion or heartburn.   Mintox 157-262-03 MG/5ML  suspension Generic drug: alum & mag hydroxide-simeth Take 30 mLs by mouth every 6 (six) hours as needed for indigestion or heartburn.   guaifenesin 100 MG/5ML syrup Commonly known as: ROBITUSSIN Take 200 mg by mouth every 6 (six) hours as needed for cough.   lactulose 10 GM/15ML solution Commonly known as: CHRONULAC Take 20 g by mouth daily.   LIQUID TEARS OP Place 1 drop 3 (three) times daily into both eyes.   lithium carbonate 450 MG CR tablet Commonly known as: ESKALITH Take 450 mg by mouth at bedtime.   loperamide 2 MG tablet Commonly known as: IMODIUM A-D Take 2 mg by mouth as needed for diarrhea or loose stools.   magnesium hydroxide 400 MG/5ML suspension Commonly known as: MILK OF MAGNESIA Take 30 mLs by mouth at bedtime as needed for mild constipation.   metoprolol succinate 25 MG 24 hr tablet Commonly known as: TOPROL-XL Take 25 mg by mouth daily.   Myrbetriq 50 MG Tb24 tablet Generic drug: mirabegron ER Take 50 mg by mouth daily.   neomycin-bacitracin-polymyxin [phone] ointment Apply 1 application topically as needed (skin tears).   pravastatin 20 MG tablet Commonly known as: PRAVACHOL Take 20 mg by mouth every evening.   risperidone 4 MG tablet Commonly known as: RISPERDAL Take 0.5 tablets (2 mg total) by mouth 2 (two) times daily. What changed:   how much to take  when to take this   senna 8.6 MG Tabs tablet Commonly known as:  SENOKOT Take 2 tablets at bedtime by mouth.   sertraline 50 MG tablet Commonly known as: ZOLOFT Take 75 mg by mouth daily.   sertraline 100 MG tablet Commonly known as: ZOLOFT Take 100 mg by mouth daily.   traZODone 100 MG tablet Commonly known as: DESYREL Take 0.5 tablets (50 mg total) by mouth at bedtime.   Vitamin C 500 MG Chew Chew 500 mg by mouth in the morning and at bedtime.   Vitamin D3 50 MCG (2000 UT) Tabs Take 1 capsule by mouth daily.   Zinc Sulfate 220 (50 Zn) MG Tabs Take 1 tablet by mouth 2  (two) times daily.       Disposition and follow-up:   Jenna Powell was discharged from [facility] in Stable condition.  At the hospital follow up visit please address:  1.  Acute toxic encephalopathy 2/2 polypharmacy. Mental status improved over hospitalization course. Psychiatry consulted and is recommending decreased dosing of her risperidone and clozaril to be done at the facility. Will continue her prior dosing at discharge as she will need close monitoring at time of dose adjustment and would prefer accepting provider to start this. They are also recommending that she undergo sleep study at some point to evaluate for OSA.  2.  Labs / imaging needed at time of follow-up: none  3.  Pending labs/ test needing follow-up: clozaril level   Hospital Course: 69 yo female with bipolar disorder, schizophrenia, and dementia who presented to [facility] on 01/26/20 for increased somnolence noted at [facility]. Due to her multiple psychiatric illnesses, she is on several medications including clozapine, benztropine, donepezil, lithium, risperadone, sertraline, and trazodone. These were held on admission and mental status improved. Psychiatry was consulted and placed recommendations as above.   Discharge Vitals:   BP 115/70 (BP Location: Right Arm)   Pulse 64   Temp 98.8 F (37.1 C) (Oral)   Resp 18   Ht 5\' 1"  (1.549 m)   Wt 90.7 kg   SpO2 99%   BMI 37.78 kg/m   Pertinent Labs, Studies, and Procedures:  CBC Latest Ref Rng & Units 01/27/2020 01/26/2020 01/26/2020  WBC 4.0 - 10.5 K/uL 5.3 4.7 -  Hemoglobin 12.0 - 15.0 g/dL 12.5 11.7(L) 12.9  Hematocrit 36 - 46 % 40.8 38.5 38.0  Platelets 150 - 400 K/uL 195 173 -   BMP Latest Ref Rng & Units 01/27/2020 01/26/2020 01/26/2020  Glucose 70 - 99 mg/dL 95 92 -  BUN 8 - 23 mg/dL 8 9 -  Creatinine 0.44 - 1.00 mg/dL 0.92 0.86 -  Sodium 135 - 145 mmol/L 141 142 140  Potassium 3.5 - 5.1 mmol/L 3.9 3.8 5.5(H)  Chloride 98 - 111  mmol/L 108 109 -  CO2 22 - 32 mmol/L 26 25 -  Calcium 8.9 - 10.3 mg/dL 9.3 8.8(L) -     Signed: Mitzi Hansen, MD 01/27/2020, 3:13 PM   Pager: [phone]

## 2020-01-27 NOTE — Progress Notes (Signed)
New Admission Note: ? Arrival Method: Stretcher Mental Orientation: Alert and Oriented x4 Telemetry: Box 14 Assessment: Completed Skin: Refer to flowsheet IV: Left forearm Pain: 0/10 Tubes: None Safety Measures: Safety Fall Prevention Plan discussed with patient. Admission: Completed 5 [place] Orientation: Patient has been orientated to the room, unit and the staff. Family: None Orders have been reviewed and are being implemented. Will continue to monitor the patient. Call light has been placed within reach and bed alarm has been activated.  ? Milagros Loll, RN  Phone Number: [phone]

## 2020-02-01 LAB — CLOZAPINE (CLOZARIL)
Clozapine Lvl: 23 ng/mL — ABNORMAL LOW (ref 350–650)
Clozapine Lvl: <20 ng/mL — ABNORMAL LOW (ref 350–650)
NorClozapine: <20 ng/mL
NorClozapine: <20 ng/mL
Total(Cloz+Norcloz): <40 ng/mL
Total(Cloz+Norcloz): <43 ng/mL

## 2021-06-13 ENCOUNTER — Encounter (HOSPITAL_COMMUNITY): Payer: Self-pay

## 2021-06-13 ENCOUNTER — Emergency Department (HOSPITAL_COMMUNITY)
Admission: EM | Admit: 2021-06-13 | Discharge: 2021-06-13 | Disposition: A | Discharge status: Home / Self Care | Payer: Medicare Other | Attending: Emergency Medicine | Admitting: Emergency Medicine

## 2021-06-13 DIAGNOSIS — I10 Essential (primary) hypertension: Secondary | ICD-10-CM | POA: Diagnosis not present

## 2021-06-13 DIAGNOSIS — E119 Type 2 diabetes mellitus without complications: Secondary | ICD-10-CM | POA: Insufficient documentation

## 2021-06-13 DIAGNOSIS — N812 Incomplete uterovaginal prolapse: Secondary | ICD-10-CM | POA: Insufficient documentation

## 2021-06-13 DIAGNOSIS — Z7982 Long term (current) use of aspirin: Secondary | ICD-10-CM | POA: Diagnosis not present

## 2021-06-13 DIAGNOSIS — Z79899 Other long term (current) drug therapy: Secondary | ICD-10-CM | POA: Diagnosis not present

## 2021-06-13 DIAGNOSIS — F0390 Unspecified dementia without behavioral disturbance: Secondary | ICD-10-CM | POA: Insufficient documentation

## 2021-06-13 DIAGNOSIS — N819 Female genital prolapse, unspecified: Secondary | ICD-10-CM | POA: Diagnosis present

## 2021-06-13 LAB — COMPREHENSIVE METABOLIC PANEL
ALT: 23 U/L (ref 0–44)
AST: 24 U/L (ref 15–41)
Albumin: 4 g/dL (ref 3.5–5.0)
Alkaline Phosphatase: 86 U/L (ref 38–126)
Anion gap: 6 (ref 5–15)
BUN: 15 mg/dL (ref 8–23)
CO2: 28 mmol/L (ref 22–32)
Calcium: 9.7 mg/dL (ref 8.9–10.3)
Chloride: 104 mmol/L (ref 98–111)
Creatinine, Ser: 1.06 mg/dL — ABNORMAL HIGH (ref 0.44–1.00)
GFR, Estimated: 57 mL/min — ABNORMAL LOW (ref >60)
Glucose, Bld: 107 mg/dL — ABNORMAL HIGH (ref 70–99)
Potassium: 3.9 mmol/L (ref 3.5–5.1)
Sodium: 138 mmol/L (ref 135–145)
Total Bilirubin: 0.7 mg/dL (ref 0.3–1.2)
Total Protein: 6.6 g/dL (ref 6.5–8.1)

## 2021-06-13 LAB — CBC WITH DIFFERENTIAL/PLATELET
Abs Immature Granulocytes: 0.01 10*3/uL (ref 0.00–0.07)
Basophils Absolute: 0 10*3/uL (ref 0.0–0.1)
Basophils Relative: 0 %
Eosinophils Absolute: 0 10*3/uL (ref 0.0–0.5)
Eosinophils Relative: 0 %
HCT: 37.5 % (ref 36.0–46.0)
Hemoglobin: 11.7 g/dL — ABNORMAL LOW (ref 12.0–15.0)
Immature Granulocytes: 0 %
Lymphocytes Relative: 36 %
Lymphs Abs: 1.5 10*3/uL (ref 0.7–4.0)
MCH: 27.2 pg (ref 26.0–34.0)
MCHC: 31.2 g/dL (ref 30.0–36.0)
MCV: 87.2 fL (ref 80.0–100.0)
Monocytes Absolute: 0.4 10*3/uL (ref 0.1–1.0)
Monocytes Relative: 10 %
Neutro Abs: 2.3 10*3/uL (ref 1.7–7.7)
Neutrophils Relative %: 54 %
Platelets: 181 10*3/uL (ref 150–400)
RBC: 4.3 MIL/uL (ref 3.87–5.11)
RDW: 13.3 % (ref 11.5–15.5)
WBC: 4.3 10*3/uL (ref 4.0–10.5)
nRBC: 0 % (ref 0.0–0.2)

## 2021-06-13 NOTE — Discharge Instructions (Signed)
Please apply lubricating ointment to the prolapsed cervix/uterus to minimize irritation and prevent bleeding.

## 2021-06-13 NOTE — ED Notes (Signed)
[facility] contacted for transportation back to [facility]

## 2021-06-13 NOTE — ED Triage Notes (Signed)
Pt to ED via [facility] from [facility] with c/o bleeding and rupture to prolapsed uterus.  Hx of prolapse x 1 year but the hole and bleeding are new today.

## 2021-06-13 NOTE — ED Provider Notes (Signed)
[facility] [facility] DEPT Provider Note  CSN: [id number] Arrival date & time: 06/13/21 5573  Chief Complaint(s) Vaginal Prolapse  HPI Jenna Powell is a 70 y.o. female here for bleeding from prolapsed cervix/uterus. Prolapse is known and chronic for several years. sNF noted small area of bleeding tonight prompting her visit.  Remainder of history, ROS, and physical exam limited due to patient's condition (dementia). Additional information was obtained from EMS/family.   Level V Caveat.   HPI  Past Medical History Past Medical History:  Diagnosis Date   Dementia ([place])    Diabetes mellitus without complication ([place])    Hyperlipidemia    Hypertension    Patient Active Problem List   Diagnosis Date Noted   Somnolence 01/26/2020   Polypharmacy 11/23/2018   Dementia without behavioral disturbance ([place]) 11/22/2018   Bipolar disorder ([place]) 11/22/2018   DM (diabetes mellitus), type 2 ([place]) 11/22/2018   Essential hypertension 11/22/2018   Home Medication(s) Prior to Admission medications   Medication Sig Start Date End Date Taking? Authorizing Provider  acetaminophen (TYLENOL) 500 MG tablet Take 500 mg by mouth every 6 (six) hours as needed for fever.    [provider]  alum & mag hydroxide-simeth (GERI-LANTA) 200-200-20 MG/5ML suspension Take 30 mLs by mouth every 6 (six) hours as needed for indigestion or heartburn.    [provider]  alum & mag hydroxide-simeth ([place]) 200-200-20 MG/5ML suspension Take 30 mLs by mouth every 6 (six) hours as needed for indigestion or heartburn.    [provider]  Ascorbic Acid (VITAMIN C) 500 MG CHEW Chew 500 mg by mouth in the morning and at bedtime.    [provider]  aspirin 81 MG EC tablet Take 81 mg by mouth daily. Swallow whole.    [provider]  benztropine (COGENTIN) 1 MG tablet Take 1 tablet (1 mg total) by mouth 2 (two) times daily. 11/23/18   [place] Donnamarie Poag, DO   Calcium Carb-Cholecalciferol 600-400 MG-UNIT TABS SMARTSIG:1 Tablet(s) By Mouth Every 12 Hours Patient not taking: No sig reported 10/19/19   [provider]  Calcium Carbonate-Vitamin D (CALCIUM 600+D) 600-400 MG-UNIT tablet Take 1 tablet by mouth every 12 (twelve) hours.    [provider]  Calcium Polycarbophil (FIBER LAXATIVE PO) Take 1 tablet by mouth 2 (two) times daily. 650mg     [provider]  Cholecalciferol (VITAMIN D3) 50 MCG (2000 UT) TABS Take 1 capsule by mouth daily.  10/19/19   [provider]  clozapine (CLOZARIL) 200 MG tablet Take 200 mg by mouth 2 (two) times daily.     [provider]  cloZAPine (CLOZARIL) 25 MG tablet Take 25 mg by mouth at bedtime. 12/08/19   [provider]  docusate sodium (COLACE) 100 MG capsule Take 100 mg by mouth 3 (three) times daily. 10/19/19   [provider]  donepezil (ARICEPT) 10 MG tablet Take 10 mg 2 (two) times daily by mouth.     [provider]  famotidine (PEPCID) 20 MG tablet Take 20 mg by mouth at bedtime. Patient not taking: Reported on 01/26/2020 08/15/19   [provider]  FIBER-LAX 625 MG tablet Take 625 mg by mouth in the morning and at bedtime.  10/19/19   [provider]  guaifenesin (ROBITUSSIN) 100 MG/5ML syrup Take 200 mg by mouth every 6 (six) hours as needed for cough.    [provider]  lactulose (CHRONULAC) 10 GM/15ML solution Take 20 g by mouth daily.  [provider]  lithium carbonate (ESKALITH) 450 MG CR tablet Take 450 mg by mouth at bedtime.    [provider]  loperamide (IMODIUM A-D) 2 MG tablet Take 2 mg by mouth as needed for diarrhea or loose stools.    [provider]  magnesium hydroxide (MILK OF MAGNESIA) 400 MG/5ML suspension Take 30 mLs by mouth at bedtime as needed for mild constipation.    [provider]  metoprolol succinate (TOPROL-XL) 25 MG 24 hr tablet Take 25 mg by mouth  daily.    [provider]  mirabegron ER (MYRBETRIQ) 50 MG TB24 tablet Take 50 mg by mouth daily.    [provider]  neomycin-bacitracin-polymyxin (NEOSPORIN) [phone] ointment Apply 1 application topically as needed (skin tears).    [provider]  Polyvinyl Alcohol (LIQUID TEARS OP) Place 1 drop 3 (three) times daily into both eyes.    [provider]  pravastatin (PRAVACHOL) 20 MG tablet Take 20 mg by mouth every evening.     [provider]  risperidone (RISPERDAL) 4 MG tablet Take 0.5 tablets (2 mg total) by mouth 2 (two) times daily. Patient taking differently: Take 4 mg by mouth at bedtime.  11/23/18   [place] Donnamarie Poag, DO  senna (SENOKOT) 8.6 MG TABS tablet Take 2 tablets at bedtime by mouth.    [provider]  sertraline (ZOLOFT) 100 MG tablet Take 100 mg by mouth daily. 12/03/19   [provider]  sertraline (ZOLOFT) 50 MG tablet Take 75 mg by mouth daily.  Patient not taking: Reported on 01/26/2020    [provider]  traZODone (DESYREL) 100 MG tablet Take 0.5 tablets (50 mg total) by mouth at bedtime. Patient not taking: Reported on 04/14/2019 11/23/18   [place] Donnamarie Poag, DO  Zinc Sulfate 220 (50 Zn) MG TABS Take 1 tablet by mouth 2 (two) times daily. Patient not taking: Reported on 01/26/2020 10/19/19   [provider]                                                                                                                                    Past Surgical History History reviewed. No pertinent surgical history. Family History History reviewed. No pertinent family history.  Social History Social History   Tobacco Use   Smoking status: Never   Smokeless tobacco: Never  Substance Use Topics   Alcohol use: No   Drug use: Never   Allergies Patient has no known allergies.  Review of Systems Review of Systems Unable to obtain due to dementia Physical Exam Vital Signs  I have reviewed the  triage vital signs BP 120/67   Pulse 60   Temp 98.2 F (36.8 C) (Oral)   Resp 19   Wt 100.2 kg   SpO2 100%   BMI 41.76 kg/m   Physical Exam Vitals reviewed. Exam conducted with a chaperone present.  Constitutional:  General: She is not in acute distress.    Appearance: She is well-developed. She is not diaphoretic.  HENT:     Head: Normocephalic and atraumatic.     Right Ear: External ear normal.     Left Ear: External ear normal.     Nose: Nose normal.  Eyes:     General: No scleral icterus.    Conjunctiva/sclera: Conjunctivae normal.  Neck:     Trachea: Phonation normal.  Cardiovascular:     Rate and Rhythm: Normal rate and regular rhythm.  Pulmonary:     Effort: Pulmonary effort is normal. No respiratory distress.     Breath sounds: No stridor.  Abdominal:     General: There is no distension.  Genitourinary:    Comments: Prolapse cervix/uterus with small punctate area - likely the source of the bleeding.  No active bleeding.  Mucosa is dry and pink.  Not erythematous or edematous.  Nontender.  Easily reduced. Musculoskeletal:        General: Normal range of motion.     Cervical back: Normal range of motion.  Neurological:     Mental Status: She is alert and oriented to person, place, and time.  Psychiatric:        Behavior: Behavior normal.    ED Results and Treatments Labs (all labs ordered are listed, but only abnormal results are displayed) Labs Reviewed  CBC WITH DIFFERENTIAL/PLATELET - Abnormal; Notable for the following components:      Result Value   Hemoglobin 11.7 (*)    All other components within normal limits  COMPREHENSIVE METABOLIC PANEL                                                                                                                         EKG  EKG Interpretation  Date/Time:    Ventricular Rate:    PR Interval:    QRS Duration:   QT Interval:    QTC Calculation:   R Axis:     Text Interpretation:          Radiology No results found.  Pertinent labs & imaging results that were available during my care of the patient were reviewed by me and considered in my medical decision making (see MDM for details).  Medications Ordered in ED Medications - No data to display  Procedures Procedures  (including critical care time)  Medical Decision Making / ED Course I have reviewed the nursing notes for this encounter and the patient's prior records (if available in EHR or on provided paperwork).  Jenna Powell was evaluated in Emergency Department on 06/13/2021 for the symptoms described in the history of present illness. She was evaluated in the context of the global COVID-19 pandemic, which necessitated consideration that the patient might be at risk for infection with the SARS-CoV-2 virus that causes COVID-19. Institutional protocols and algorithms that pertain to the evaluation of patients at risk for COVID-19 are in a state of rapid change based on information released by regulatory bodies including the CDC and federal and state organizations. These policies and algorithms were followed during the patient's care in the ED.     Cervix/uterus prolapse with mucosal irritation and bleeding.  Now hemostatic. No need for acute intervention. Called and spoke with patient's son who reported patient already has follow-up with gynecology. Recommended lubricating ointment.  Pertinent labs & imaging results that were available during my care of the patient were reviewed by me and considered in my medical decision making:    Final Clinical Impression(s) / ED Diagnoses Final diagnoses:  Cervical prolapse   The patient appears reasonably screened and/or stabilized for discharge and I doubt any other medical condition or other [facility] requiring further screening, evaluation, or  treatment in the ED at this time prior to discharge. Safe for discharge with strict return precautions.  Disposition: Discharge  Condition: Good  I have discussed the results, Dx and Tx plan with the patient/family who expressed understanding and agree(s) with the plan. Discharge instructions discussed at length. The patient/family was given strict return precautions who verbalized understanding of the instructions. No further questions at time of discharge.    ED Discharge Orders     None         Follow Up: Gynecology  Go to  as scheduled     This chart was dictated using voice recognition software.  Despite best efforts to proofread,  errors can occur which can change the documentation meaning.    Fatima Blank, MD 06/13/21 [phone]

## 2021-06-13 NOTE — ED Notes (Signed)
[facility] contacted about pt's discharge.

## 2022-07-01 ENCOUNTER — Observation Stay (HOSPITAL_COMMUNITY): Payer: Medicare Other

## 2022-07-01 ENCOUNTER — Emergency Department (HOSPITAL_COMMUNITY): Payer: Medicare Other

## 2022-07-01 ENCOUNTER — Inpatient Hospital Stay (HOSPITAL_COMMUNITY)
Admission: EM | Admit: 2022-07-01 | Discharge: 2022-07-14 | DRG: 025 | Disposition: A | Discharge status: Skilled Nursing Facility | Payer: Medicare Other | Source: Skilled Nursing Facility | Attending: Internal Medicine | Admitting: Internal Medicine

## 2022-07-01 DIAGNOSIS — Z1152 Encounter for screening for COVID-19: Secondary | ICD-10-CM

## 2022-07-01 DIAGNOSIS — Z7982 Long term (current) use of aspirin: Secondary | ICD-10-CM

## 2022-07-01 DIAGNOSIS — R4182 Altered mental status, unspecified: Secondary | ICD-10-CM | POA: Diagnosis not present

## 2022-07-01 DIAGNOSIS — R471 Dysarthria and anarthria: Secondary | ICD-10-CM | POA: Diagnosis not present

## 2022-07-01 DIAGNOSIS — R569 Unspecified convulsions: Principal | ICD-10-CM | POA: Diagnosis present

## 2022-07-01 DIAGNOSIS — G9340 Encephalopathy, unspecified: Secondary | ICD-10-CM

## 2022-07-01 DIAGNOSIS — I10 Essential (primary) hypertension: Secondary | ICD-10-CM | POA: Diagnosis present

## 2022-07-01 DIAGNOSIS — J9811 Atelectasis: Secondary | ICD-10-CM | POA: Diagnosis present

## 2022-07-01 DIAGNOSIS — E785 Hyperlipidemia, unspecified: Secondary | ICD-10-CM | POA: Diagnosis present

## 2022-07-01 DIAGNOSIS — F319 Bipolar disorder, unspecified: Secondary | ICD-10-CM | POA: Diagnosis not present

## 2022-07-01 DIAGNOSIS — G936 Cerebral edema: Secondary | ICD-10-CM | POA: Diagnosis present

## 2022-07-01 DIAGNOSIS — F0393 Unspecified dementia, unspecified severity, with mood disturbance: Secondary | ICD-10-CM | POA: Diagnosis present

## 2022-07-01 DIAGNOSIS — F209 Schizophrenia, unspecified: Secondary | ICD-10-CM | POA: Diagnosis present

## 2022-07-01 DIAGNOSIS — F0390 Unspecified dementia without behavioral disturbance: Secondary | ICD-10-CM | POA: Diagnosis not present

## 2022-07-01 DIAGNOSIS — E872 Acidosis, unspecified: Secondary | ICD-10-CM | POA: Diagnosis present

## 2022-07-01 DIAGNOSIS — D329 Benign neoplasm of meninges, unspecified: Secondary | ICD-10-CM | POA: Diagnosis not present

## 2022-07-01 DIAGNOSIS — D320 Benign neoplasm of cerebral meninges: Secondary | ICD-10-CM | POA: Diagnosis present

## 2022-07-01 DIAGNOSIS — R4189 Other symptoms and signs involving cognitive functions and awareness: Secondary | ICD-10-CM

## 2022-07-01 DIAGNOSIS — Z603 Acculturation difficulty: Secondary | ICD-10-CM | POA: Diagnosis present

## 2022-07-01 DIAGNOSIS — E119 Type 2 diabetes mellitus without complications: Secondary | ICD-10-CM | POA: Diagnosis present

## 2022-07-01 DIAGNOSIS — G9341 Metabolic encephalopathy: Secondary | ICD-10-CM | POA: Diagnosis present

## 2022-07-01 DIAGNOSIS — Z79899 Other long term (current) drug therapy: Secondary | ICD-10-CM

## 2022-07-01 LAB — CBC WITH DIFFERENTIAL/PLATELET
Abs Immature Granulocytes: 0.04 10*3/uL (ref 0.00–0.07)
Basophils Absolute: 0 10*3/uL (ref 0.0–0.1)
Basophils Relative: 0 %
Eosinophils Absolute: 0.1 10*3/uL (ref 0.0–0.5)
Eosinophils Relative: 1 %
HCT: 41.3 % (ref 36.0–46.0)
Hemoglobin: 12.5 g/dL (ref 12.0–15.0)
Immature Granulocytes: 1 %
Lymphocytes Relative: 15 %
Lymphs Abs: 0.8 10*3/uL (ref 0.7–4.0)
MCH: 27.4 pg (ref 26.0–34.0)
MCHC: 30.3 g/dL (ref 30.0–36.0)
MCV: 90.6 fL (ref 80.0–100.0)
Monocytes Absolute: 0.3 10*3/uL (ref 0.1–1.0)
Monocytes Relative: 5 %
Neutro Abs: 4.5 10*3/uL (ref 1.7–7.7)
Neutrophils Relative %: 78 %
Platelets: 134 10*3/uL — ABNORMAL LOW (ref 150–400)
RBC: 4.56 MIL/uL (ref 3.87–5.11)
RDW: 12.8 % (ref 11.5–15.5)
WBC: 5.8 10*3/uL (ref 4.0–10.5)
nRBC: 0 % (ref 0.0–0.2)

## 2022-07-01 LAB — I-STAT VENOUS BLOOD GAS, ED
Acid-base deficit: 1 mmol/L (ref 0.0–2.0)
Bicarbonate: 25.8 mmol/L (ref 20.0–28.0)
Calcium, Ion: 1.27 mmol/L (ref 1.15–1.40)
HCT: 39 % (ref 36.0–46.0)
Hemoglobin: 13.3 g/dL (ref 12.0–15.0)
O2 Saturation: 70 %
Potassium: 4.8 mmol/L (ref 3.5–5.1)
Sodium: 140 mmol/L (ref 135–145)
TCO2: 27 mmol/L (ref 22–32)
pCO2, Ven: 51.9 mmHg (ref 44–60)
pH, Ven: 7.303 (ref 7.25–7.43)
pO2, Ven: 41 mmHg (ref 32–45)

## 2022-07-01 LAB — LITHIUM LEVEL: Lithium Lvl: 0.54 mmol/L — ABNORMAL LOW (ref 0.60–1.20)

## 2022-07-01 LAB — CBG MONITORING, ED
Glucose-Capillary: 112 mg/dL — ABNORMAL HIGH (ref 70–99)
Glucose-Capillary: 130 mg/dL — ABNORMAL HIGH (ref 70–99)
Glucose-Capillary: 137 mg/dL — ABNORMAL HIGH (ref 70–99)

## 2022-07-01 LAB — TSH: TSH: 1.306 u[IU]/mL (ref 0.350–4.500)

## 2022-07-01 LAB — LACTIC ACID, PLASMA: Lactic Acid, Venous: 5.4 mmol/L (ref 0.5–1.9)

## 2022-07-01 LAB — RESP PANEL BY RT-PCR (FLU A&B, COVID) ARPGX2
Influenza A by PCR: NEGATIVE
Influenza B by PCR: NEGATIVE
SARS Coronavirus 2 by RT PCR: NEGATIVE

## 2022-07-01 LAB — ETHANOL: Alcohol, Ethyl (B): <10 mg/dL (ref <10)

## 2022-07-01 LAB — AMMONIA: Ammonia: 48 umol/L — ABNORMAL HIGH (ref 9–35)

## 2022-07-01 MED ORDER — SODIUM CHLORIDE 0.9% FLUSH
3.0000 mL | Freq: Two times a day (BID) | INTRAVENOUS | Status: DC
  Administered 2022-07-01 – 2022-07-12 (×21): 3 mL via INTRAVENOUS

## 2022-07-01 MED ORDER — LITHIUM CARBONATE 300 MG PO CAPS
300.0000 mg | ORAL_CAPSULE | Freq: Every day | ORAL | Status: DC
  Administered 2022-07-02 – 2022-07-14 (×12): 300 mg via ORAL
  Filled 2022-07-01 (×15): qty 1

## 2022-07-01 MED ORDER — LITHIUM CARBONATE 150 MG PO CAPS
150.0000 mg | ORAL_CAPSULE | Freq: Every day | ORAL | Status: DC
  Administered 2022-07-02 – 2022-07-14 (×13): 150 mg via ORAL
  Filled 2022-07-01 (×15): qty 1

## 2022-07-01 MED ORDER — LEVETIRACETAM IN NACL 500 MG/100ML IV SOLN
500.0000 mg | Freq: Two times a day (BID) | INTRAVENOUS | Status: DC
  Administered 2022-07-02 – 2022-07-10 (×17): 500 mg via INTRAVENOUS
  Filled 2022-07-01 (×17): qty 100

## 2022-07-01 MED ORDER — POLYETHYLENE GLYCOL 3350 17 G PO PACK
17.0000 g | PACK | Freq: Every day | ORAL | Status: DC | PRN
  Administered 2022-07-04: 17 g via ORAL
  Filled 2022-07-01: qty 1

## 2022-07-01 MED ORDER — ACETAMINOPHEN 650 MG RE SUPP: 650.0000 mg | Freq: Four times a day (QID) | RECTAL | Status: DC | PRN

## 2022-07-01 MED ORDER — NALOXONE HCL 4 MG/0.1ML NA LIQD
1.0000 | Freq: Once | NASAL | Status: AC
  Administered 2022-07-01: 1 via NASAL
  Filled 2022-07-01: qty 4

## 2022-07-01 MED ORDER — MIRABEGRON ER 25 MG PO TB24
50.0000 mg | ORAL_TABLET | Freq: Every day | ORAL | Status: DC
  Administered 2022-07-02 – 2022-07-14 (×13): 50 mg via ORAL
  Filled 2022-07-01: qty 1
  Filled 2022-07-01 (×4): qty 2
  Filled 2022-07-01: qty 1
  Filled 2022-07-01 (×3): qty 2
  Filled 2022-07-01 (×2): qty 1
  Filled 2022-07-01 (×4): qty 2

## 2022-07-01 MED ORDER — ACETAMINOPHEN 325 MG PO TABS: 650.0000 mg | ORAL_TABLET | Freq: Four times a day (QID) | ORAL | Status: DC | PRN

## 2022-07-01 MED ORDER — LORAZEPAM 2 MG/ML IJ SOLN
0.5000 mg | INTRAMUSCULAR | Status: AC | PRN
  Administered 2022-07-03: 0.5 mg via INTRAVENOUS
  Filled 2022-07-01: qty 1

## 2022-07-01 MED ORDER — METOPROLOL SUCCINATE ER 25 MG PO TB24
25.0000 mg | ORAL_TABLET | Freq: Every day | ORAL | Status: DC
  Administered 2022-07-02 – 2022-07-07 (×6): 25 mg via ORAL
  Filled 2022-07-01 (×6): qty 1

## 2022-07-01 MED ORDER — LORAZEPAM 2 MG/ML IJ SOLN
1.0000 mg | INTRAMUSCULAR | Status: AC | PRN
  Administered 2022-07-01: 1 mg via INTRAVENOUS
  Filled 2022-07-01: qty 1

## 2022-07-01 MED ORDER — INSULIN ASPART 100 UNIT/ML IJ SOLN
0.0000 [IU] | Freq: Three times a day (TID) | INTRAMUSCULAR | Status: DC
  Administered 2022-07-04 – 2022-07-12 (×11): 1 [IU] via SUBCUTANEOUS
  Administered 2022-07-13: 2 [IU] via SUBCUTANEOUS
  Administered 2022-07-13 – 2022-07-14 (×4): 1 [IU] via SUBCUTANEOUS

## 2022-07-01 MED ORDER — LITHIUM CARBONATE 150 MG PO CAPS: 150.0000 mg | ORAL_CAPSULE | ORAL | Status: DC

## 2022-07-01 MED ORDER — ENOXAPARIN SODIUM 40 MG/0.4ML IJ SOSY
40.0000 mg | PREFILLED_SYRINGE | INTRAMUSCULAR | Status: DC
  Administered 2022-07-01 – 2022-07-07 (×7): 40 mg via SUBCUTANEOUS
  Filled 2022-07-01 (×7): qty 0.4

## 2022-07-01 MED ORDER — LEVETIRACETAM IN NACL 1000 MG/100ML IV SOLN
1000.0000 mg | Freq: Once | INTRAVENOUS | Status: AC
  Administered 2022-07-01: 1000 mg via INTRAVENOUS
  Filled 2022-07-01: qty 100

## 2022-07-01 MED ORDER — DEXAMETHASONE SODIUM PHOSPHATE 10 MG/ML IJ SOLN
10.0000 mg | Freq: Once | INTRAMUSCULAR | Status: AC
  Administered 2022-07-01: 10 mg via INTRAVENOUS
  Filled 2022-07-01: qty 1

## 2022-07-01 MED ORDER — LORAZEPAM 0.5 MG PO TABS
0.5000 mg | ORAL_TABLET | ORAL | Status: AC | PRN
  Administered 2022-07-03: 0.5 mg via ORAL
  Filled 2022-07-01: qty 1

## 2022-07-01 MED ORDER — PRAVASTATIN SODIUM 40 MG PO TABS
20.0000 mg | ORAL_TABLET | Freq: Every day | ORAL | Status: DC
  Administered 2022-07-02 – 2022-07-13 (×10): 20 mg via ORAL
  Filled 2022-07-01 (×7): qty 1
  Filled 2022-07-01: qty 2
  Filled 2022-07-01 (×3): qty 1

## 2022-07-01 MED ORDER — BENZTROPINE MESYLATE 1 MG PO TABS
1.0000 mg | ORAL_TABLET | Freq: Two times a day (BID) | ORAL | Status: DC
  Administered 2022-07-02 – 2022-07-14 (×23): 1 mg via ORAL
  Filled 2022-07-01 (×29): qty 1

## 2022-07-01 NOTE — ED Notes (Signed)
Pt in radiology 

## 2022-07-01 NOTE — Consult Note (Incomplete)
NEURO HOSPITALIST CONSULT NOTE   Requestig physician: Dr. Trilby Drummer  Reason for Consult: Seizure in the setting of meningioma   History obtained from:  Chart     HPI:                                                                                                                                          Jenna Powell is an 71 y.o. female with a PMHx of dementia, schizophrenia, DM, HLD and HTN who was BIB EMS after being found unresponsive at [facility]. LKN 1020. She vomited x 3 with EMS and was noted to have pinpoint pupils; respirations were normal. Not given Narcan. She was more alert upon arrival to the ED, with GCS of 12. She was afebrile without tachycardia on arrival, but mildly tachypneic. BP was 115/77.   CT head performed in the ED revealed a meningioma in the left parietal vertex region with evidence of adjacent vasogenic edema.   Given labs with elevated lactate > 5, an unwitnessed seizure was considered to be relatively high on the DDx. 1000 mg IV Keppra was given. She was also administered 10 mg IV Decadron. EEG was ordered and Neurology was consulted to further evaluate.   At baseline she is alert and oriented x 2, interactive/talkative and not somnolent. On chart review, the patient has had 2 prior admissions to the hospital in 2020 and 2021 for somnolence/encephalopathy, thought to be due to polypharmacy in the setting of the patient's multiple home medications that were sedating.   Past Medical History:  Diagnosis Date  . Dementia ([place])   . Diabetes mellitus without complication ([place])   . Hyperlipidemia   . Hypertension     No past surgical history on file.  No family history on file.           Social History:  reports that she has never smoked. She has never used smokeless tobacco. She reports that she does not drink alcohol and does not use drugs.  No Known Allergies  MEDICATIONS:                                                                                                                      No current facility-administered medications on file prior to encounter.  Current Outpatient Medications on File Prior to Encounter  Medication Sig Dispense Refill  . aspirin 81 MG chewable tablet Chew 81 mg by mouth in the morning.    . benztropine (COGENTIN) 1 MG tablet Take 1 tablet (1 mg total) by mouth 2 (two) times daily. 60 tablet 0  . Calcium Carb-Cholecalciferol (CALCIUM + VITAMIN D3) 600-10 MG-MCG TABS Take 1 tablet by mouth every 12 (twelve) hours.    . Cholecalciferol 50 MCG (2000 UT) CAPS Take 2,000 Units by mouth in the morning.    . clozapine (CLOZARIL) 50 MG tablet Take 75 mg by mouth 2 (two) times daily.    Marland Kitchen Dextran 70-Hypromellose, PF, (GENTEAL TEARS MODERATE PF) 0.1-0.3 % SOLN Place 1 drop into both eyes 3 (three) times daily.    Marland Kitchen docusate sodium (COLACE) 100 MG capsule Take 100 mg by mouth 3 (three) times daily.    Marland Kitchen donepezil (ARICEPT) 10 MG tablet Take 10 mg by mouth at bedtime.    . famotidine (PEPCID) 20 MG tablet Take 20 mg by mouth at bedtime.    Marland Kitchen lactulose (CHRONULAC) 10 GM/15ML solution Take 20 g by mouth in the morning.    . lithium carbonate 150 MG capsule Take 150-300 mg by mouth See admin instructions. 2 entries on MAR: 150 mg every morning, 300 mg at bedtime    . metoprolol succinate (TOPROL-XL) 25 MG 24 hr tablet Take 25 mg by mouth in the morning.    . mirabegron ER (MYRBETRIQ) 50 MG TB24 tablet Take 50 mg by mouth in the morning.    . polycarbophil (FIBER-LAX) 625 MG tablet Take 625 mg by mouth 2 (two) times daily.    . pravastatin (PRAVACHOL) 20 MG tablet Take 20 mg by mouth at bedtime.    . risperiDONE (RISPERDAL) 3 MG tablet Take 3 mg by mouth at bedtime.    . Salicylic Acid (SELSUN BLUE [employer]URALS DRY SCALP) 3 % SHAM Apply 1 application  topically See admin instructions. Apply to scalp on shower days (Monday, Wednesday)    . senna (SENOKOT) 8.6 MG TABS tablet Take 17.2 mg by mouth at  bedtime.    . sertraline (ZOLOFT) 100 MG tablet Take 100 mg by mouth in the morning.    . traZODone (DESYREL) 50 MG tablet Take 50 mg by mouth at bedtime.      Scheduled: . benztropine  1 mg Oral BID  . enoxaparin (LOVENOX) injection  40 mg Subcutaneous Q24H  . insulin aspart  0-9 Units Subcutaneous TID WC  . [START ON 07/02/2022] lithium carbonate  150 mg Oral Q breakfast  . lithium carbonate  300 mg Oral Q supper  . [START ON 07/02/2022] metoprolol succinate  25 mg Oral Daily  . [START ON 07/02/2022] mirabegron ER  50 mg Oral Daily  . pravastatin  20 mg Oral QHS  . sodium chloride flush  3 mL Intravenous Q12H   Continuous: . [START ON 07/02/2022] levETIRAcetam       ROS:  Unable to obtain due to lethargy.    Blood pressure 137/83, pulse 89, temperature 98.1 F (36.7 C), temperature source Oral, resp. rate 17, SpO2 96 %.   General Examination:                                                                                                       Physical Exam  HEENT-  Normocephalic, no lesions, without obvious abnormality.  Normal external eye and conjunctiva.   Cardiovascular- S1-S2 audible, pulses palpable throughout   Lungs-no rhonchi or wheezing noted, no excessive working breathing.  Saturations within normal limits Abdomen- All 4 quadrants palpated and nontender Extremities- Warm, dry and intact Musculoskeletal-no joint tenderness, deformity or swelling Skin-warm and dry, no hyperpigmentation, vitiligo, or suspicious lesions  Neurological Examination Mental Status: Alert, oriented, thought content appropriate.  Speech fluent without evidence of aphasia.  Able to follow 3 step commands without difficulty. Cranial Nerves: II: Discs flat bilaterally; Visual fields grossly normal,  III,IV, VI: ptosis not present, extra-ocular motions intact  bilaterally pupils equal, round, reactive to light and accommodation V,VII: smile symmetric, facial light touch sensation normal bilaterally VIII: hearing normal bilaterally IX,X: uvula rises symmetrically XI: bilateral shoulder shrug XII: midline tongue extension Motor: Right : Upper extremity   5/5    Left:     Upper extremity   5/5  Lower extremity   5/5     Lower extremity   5/5 Tone and bulk:normal tone throughout; no atrophy noted Sensory: Pinprick and light touch intact throughout, bilaterally Deep Tendon Reflexes: 2+ and symmetric throughout Plantars: Right: downgoing   Left: downgoing Cerebellar: normal finger-to-nose, normal rapid alternating movements and normal heel-to-shin test Gait: normal gait and station   Lab Results: Basic Metabolic Panel: Recent Labs  Lab 07/01/22 1237  NA 140  K 4.8    CBC: Recent Labs  Lab 07/01/22 1225 07/01/22 1237  WBC 5.8  --   NEUTROABS 4.5  --   HGB 12.5 13.3  HCT 41.3 39.0  MCV 90.6  --   PLT 134*  --     Cardiac Enzymes: No results for input(s): "CKTOTAL", "CKMB", "CKMBINDEX", "TROPONINI" in the last 168 hours.  Lipid Panel: No results for input(s): "CHOL", "TRIG", "HDL", "CHOLHDL", "VLDL", "LDLCALC" in the last 168 hours.  Imaging: EEG adult  Result Date: 07/01/2022 Lora Havens, MD     07/01/2022  8:58 PM Patient Name: VERGENE MARLAND MRN: [id number] Epilepsy Attending: Lora Havens Referring Physician/Provider: Regan Lemming, MD Date: 07/01/2022 Duration: 24.16 mins Patient history: 71 y.o. female with DM, bipolar disorder, schizophrenia presented to the hospital after an episode of syncope. EEG to evaluate for seizure. Level of alertness: Awake, asleep AEDs during EEG study: LEV, Ativan Technical aspects: This EEG study was done with scalp electrodes positioned according to the 10-20 International system of electrode placement. Electrical activity was reviewed with band pass filter of 1-_0 , sensitivity of  7 uV/mm, display speed of 41m/sec with a _1  notched filter applied as appropriate. EEG data were recorded continuously and digitally stored.  Video monitoring was available  and reviewed as appropriate. Description: No clear posterior dominant rhythm was seen. Sleep was characterized by sleep spindles (12 to 14 Hz), maximal frontocentral region.  EEG showed continuous generalized 3 to 7 Hz theta-delta slowing. Hyperventilation and photic stimulation were not performed.   ABNORMALITY - Continuous slow, generalized IMPRESSION: This study is suggestive of moderate diffuse encephalopathy, nonspecific etiology. No seizures or epileptiform discharges were seen throughout the recording. Priyanka Barbra Sarks   CT HEAD WO CONTRAST  Result Date: 07/01/2022 CLINICAL DATA:  Mental status change of unknown cause. EXAM: CT HEAD WITHOUT CONTRAST TECHNIQUE: Contiguous axial images were obtained from the base of the skull through the vertex without intravenous contrast. RADIATION DOSE REDUCTION: This exam was performed according to the departmental dose-optimization program which includes automated exposure control, adjustment of the mA and/or kV according to patient size and/or use of iterative reconstruction technique. COMPARISON:  01/26/2020 FINDINGS: Brain: No abnormality affects the brainstem or cerebellum. The right cerebral hemisphere is normal for age. On the left, there is vasogenic edema in the parietal vertex region. There is a slightly hyperdense extra-axial mass lesion measuring 3 x 3.5 cm in diameter consistent with meningioma. This can be seen as a very subtle finding in 2021, not prospectively visible. This has enlarged. There is a broad surface along the falx. I cannot make any statement regarding superior sagittal sinus invasion but that is possible. MRI with contrast would be recommended for further evaluation. No hydrocephalus. No hemorrhage. No extra-axial fluid collection. Vascular: No abnormal vascular  finding. Skull: Negative Sinuses/Orbits: Clear/normal Other: None IMPRESSION: 3 x 3.5 cm extra-axial mass lesion in the left parietal vertex region with vasogenic edema within the left parietal lobe. This can be seen as a very subtle finding in 2021, smaller and not prospectively visible. This is consistent with a meningioma. I cannot make any statement regarding superior sagittal sinus invasion but that is possible. MRI with contrast would be recommended for further evaluation. Electronically Signed   By: Nelson Chimes M.D.   On: 07/01/2022 13:53   DG Chest Port 1 View  Result Date: 07/01/2022 CLINICAL DATA:  Altered mental status. EXAM: PORTABLE CHEST 1 VIEW COMPARISON:  Chest x-ray dated January 26, 2020. FINDINGS: Chronic cardiomegaly. Normal pulmonary vascularity. Chronic low lung volumes with bibasilar atelectasis. Chronic elevation of the left hemidiaphragm. No pneumothorax or large pleural effusion. No acute osseous abnormality. IMPRESSION: 1. Chronic low lung volumes with bibasilar atelectasis. Electronically Signed   By: Titus Dubin M.D.   On: 07/01/2022 12:40     Assessment: 71 year old female presenting with 71 y.o. female with a PMHx of dementia, schizophrenia, DM, HLD and HTN who was BIB EMS after being found unresponsive at [facility]. LKN 1020. She vomited x 3 with EMS and was noted to have pinpoint pupils; respirations were normal. Not given Narcan. She was more alert upon arrival to the ED, with GCS of 12. She was afebrile without tachycardia on arrival, but mildly tachypneic. BP was 115/77.   CT head performed in the ED revealed a meningioma in the left parietal vertex region with evidence of adjacent vasogenic edema.   Given labs with elevated lactate > 5, an unwitnessed seizure was considered to be relatively high on the DDx. 1000 mg IV Keppra was given. She was also administered 10 mg IV Decadron. EEG was ordered and Neurology was consulted to further evaluate.   At  baseline she is alert and oriented x 2, interactive/talkative and not somnolent. On chart  review, the patient has had 2 prior admissions to the hospital in 2020 and 2021 for somnolence/encephalopathy, thought to be due to polypharmacy in the setting of the patient's multiple home medications that were sedating.  - Exam reveals a lethargic patient who perseverates "yes" to almost all questions and is able to localize to pain. No jerking, twitching or other clinical seizure-like activity noted.  - CT head: 3 x 3.5 cm extra-axial mass lesion in the left parietal vertex region with vasogenic edema within the left parietal lobe. This can be seen as a very subtle finding in 2021, smaller and not prospectively visible. This is consistent with a meningioma. Radiology cannot make any statement regarding superior sagittal sinus invasion but that is possible - EEG:  Continuous slow, generalized. This study is suggestive of moderate diffuse encephalopathy, nonspecific to etiology. No seizures or epileptiform discharges were seen throughout the recording. - Labs: - Ammonia elevated at 48 - Lactate elevated at 5.4 on labs obtained shortly after arrival - No leukocytosis on CBC - EtOH < 10 - Lithium level not toxic, but slightly below therapeutic (0.54) - ***  Recommendations: - Creatinine level was ordered but has not yet been drawn. Cr level changed to STAT.  - MRI brain with and without contrast if Cr shows eGFR > 40. If Cr is too low, then obtain MRI without contrast.  - Continue Keppra at 500 mg IV BID - Further recommendations pending MRI   Electronically signed: Dr. Kerney Elbe 07/01/2022, 10:19 PM

## 2022-07-01 NOTE — ED Notes (Signed)
This RN completing neuro assessment, pt responding to voice but unable to follow commands for neurological assessment. Pt found to have pinpoint pupils. MD Lindzen at bedside during assessment, aware of pupil assessment, MD able to arouse pt more with minimal painful stimulation.

## 2022-07-01 NOTE — ED Provider Notes (Signed)
[facility] EMERGENCY DEPARTMENT Provider Note   CSN: [id number] Arrival date & time: 07/01/22  1208     History  Chief Complaint  Patient presents with   unresponsive   Emesis    Jenna Powell is a 71 y.o. female.   Emesis    71 year old female with medical history significant for hypertension, hyperlipidemia, dementia, schizophrenia, DM 2 who presents to the emergency department with altered mental status.  The history is provided by EMS and additional family members bedside.  Family states the patient's baseline is alert and oriented x2, normally interactive and talkative, not somnolent.  On chart review, the patient has not had 2 prior admissions to the hospital in 2020 and 2021 for somnolence, encephalopathy, thought to be polysubstance abuse in the setting of the patient's multiple home medications that are sedating.  The patient was reportedly found unresponsive at [facility] facility with a last known normal around 1020.  She had 3 episodes of vomiting with EMS and was found to have pinpoint pupils, respirations were normal.  Not administered Narcan, CBG normal with EMS, normal vitals.  The patient's GCS on arrival was 12. She arrived ABC intact.  Home Medications Prior to Admission medications   Medication Sig Start Date End Date Taking? Authorizing Provider  aspirin 81 MG chewable tablet Chew 81 mg by mouth in the morning.   Yes [provider]  benztropine (COGENTIN) 1 MG tablet Take 1 tablet (1 mg total) by mouth 2 (two) times daily. 11/23/18  Yes [place] Donnamarie Poag, DO  Calcium Carb-Cholecalciferol (CALCIUM + VITAMIN D3) 600-10 MG-MCG TABS Take 1 tablet by mouth every 12 (twelve) hours.   Yes [provider]  Cholecalciferol 50 MCG (2000 UT) CAPS Take 2,000 Units by mouth in the morning.   Yes [provider]  clozapine (CLOZARIL) 50 MG tablet Take 75 mg by mouth 2 (two) times daily.   Yes [provider]  Dextran  70-Hypromellose, PF, (GENTEAL TEARS MODERATE PF) 0.1-0.3 % SOLN Place 1 drop into both eyes 3 (three) times daily.   Yes [provider]  docusate sodium (COLACE) 100 MG capsule Take 100 mg by mouth 3 (three) times daily.   Yes [provider]  donepezil (ARICEPT) 10 MG tablet Take 10 mg by mouth at bedtime.   Yes [provider]  famotidine (PEPCID) 20 MG tablet Take 20 mg by mouth at bedtime.   Yes [provider]  lactulose (CHRONULAC) 10 GM/15ML solution Take 20 g by mouth in the morning.   Yes [provider]  lithium carbonate 150 MG capsule Take 150-300 mg by mouth See admin instructions. 2 entries on MAR: 150 mg every morning, 300 mg at bedtime   Yes [provider]  metoprolol succinate (TOPROL-XL) 25 MG 24 hr tablet Take 25 mg by mouth in the morning.   Yes [provider]  mirabegron ER (MYRBETRIQ) 50 MG TB24 tablet Take 50 mg by mouth in the morning.   Yes [provider]  polycarbophil (FIBER-LAX) 625 MG tablet Take 625 mg by mouth 2 (two) times daily.   Yes [provider]  pravastatin (PRAVACHOL) 20 MG tablet Take 20 mg by mouth at bedtime.   Yes [provider]  risperiDONE (RISPERDAL) 3 MG tablet Take 3 mg by mouth at bedtime.   Yes [provider]  Salicylic Acid (SELSUN BLUE [employer]URALS DRY SCALP) 3 % SHAM Apply 1 application  topically See admin instructions. Apply to scalp  on shower days (Monday, Wednesday)   Yes [provider]  senna (SENOKOT) 8.6 MG TABS tablet Take 17.2 mg by mouth at bedtime.   Yes [provider]  sertraline (ZOLOFT) 100 MG tablet Take 100 mg by mouth in the morning.   Yes [provider]  traZODone (DESYREL) 50 MG tablet Take 50 mg by mouth at bedtime.   Yes [provider]      Allergies    Patient has no known allergies.    Review of Systems   Review of Systems  Unable to perform ROS: Mental status change   Gastrointestinal:  Positive for vomiting.    Physical Exam Updated Vital Signs BP 135/79   Pulse 86   Temp 98.5 F (36.9 C) (Rectal)   Resp 16   SpO2 95%  Physical Exam Vitals and nursing note reviewed.  Constitutional:      Appearance: She is well-developed.     Comments: GCS 12  HENT:     Head: Normocephalic and atraumatic.  Eyes:     Conjunctiva/sclera: Conjunctivae normal.     Comments: Pupils pinpoint bilaterally  Cardiovascular:     Rate and Rhythm: Normal rate and regular rhythm.  Pulmonary:     Effort: Pulmonary effort is normal. No respiratory distress.     Breath sounds: Normal breath sounds.     Comments: Respirations even and unlabored, no bradypnea Abdominal:     Palpations: Abdomen is soft.     Tenderness: There is no abdominal tenderness.  Musculoskeletal:        General: No swelling.     Cervical back: Neck supple.     Right lower leg: No edema.     Left lower leg: No edema.  Skin:    General: Skin is warm and dry.     Capillary Refill: Capillary refill takes less than 2 seconds.  Neurological:     Comments: No clear cranial nerve deficit, moving all 4 extremities in response to painful stimuli  Psychiatric:     Comments: Unable to assess     ED Results / Procedures / Treatments   Labs (all labs ordered are listed, but only abnormal results are displayed) Labs Reviewed  CBC WITH DIFFERENTIAL/PLATELET - Abnormal; Notable for the following components:      Result Value   Platelets 134 (*)    All other components within normal limits  LACTIC ACID, PLASMA - Abnormal; Notable for the following components:   Lactic Acid, Venous 5.4 (*)    All other components within normal limits  LITHIUM LEVEL - Abnormal; Notable for the following components:   Lithium Lvl 0.54 (*)    All other components within normal limits  AMMONIA - Abnormal; Notable for the following components:   Ammonia 48 (*)    All other components within normal limits  CBG  MONITORING, ED - Abnormal; Notable for the following components:   Glucose-Capillary 112 (*)    All other components within normal limits  CBG MONITORING, ED - Abnormal; Notable for the following components:   Glucose-Capillary 137 (*)    All other components within normal limits  RESP PANEL BY RT-PCR (FLU A&B, COVID) ARPGX2  CULTURE, BLOOD (ROUTINE X 2)  CULTURE, BLOOD (ROUTINE X 2)  URINE CULTURE  ETHANOL  TSH  URINALYSIS, ROUTINE W REFLEX MICROSCOPIC  RAPID URINE DRUG SCREEN, HOSP PERFORMED  COMPREHENSIVE METABOLIC PANEL  T4, FREE  COMPREHENSIVE METABOLIC PANEL  CBC  HIV ANTIBODY (ROUTINE TESTING W REFLEX)  I-STAT  VENOUS BLOOD GAS, ED    EKG EKG Interpretation  Date/Time:  Tuesday July 01 2022 12:16:48 EST Ventricular Rate:  92 PR Interval:  195 QRS Duration: 83 QT Interval:  407 QTC Calculation: 504 R Axis:   19 Text Interpretation: Sinus rhythm Abnormal R-wave progression, early transition Inferior infarct, old Lateral leads are also involved Prolonged QT interval new since last tracing Confirmed by Regan Lemming ([id number]) on 07/01/2022 3:05:05 PM  Radiology CT HEAD WO CONTRAST  Result Date: 07/01/2022 CLINICAL DATA:  Mental status change of unknown cause. EXAM: CT HEAD WITHOUT CONTRAST TECHNIQUE: Contiguous axial images were obtained from the base of the skull through the vertex without intravenous contrast. RADIATION DOSE REDUCTION: This exam was performed according to the departmental dose-optimization program which includes automated exposure control, adjustment of the mA and/or kV according to patient size and/or use of iterative reconstruction technique. COMPARISON:  01/26/2020 FINDINGS: Brain: No abnormality affects the brainstem or cerebellum. The right cerebral hemisphere is normal for age. On the left, there is vasogenic edema in the parietal vertex region. There is a slightly hyperdense extra-axial mass lesion measuring 3 x 3.5 cm in diameter consistent with  meningioma. This can be seen as a very subtle finding in 2021, not prospectively visible. This has enlarged. There is a broad surface along the falx. I cannot make any statement regarding superior sagittal sinus invasion but that is possible. MRI with contrast would be recommended for further evaluation. No hydrocephalus. No hemorrhage. No extra-axial fluid collection. Vascular: No abnormal vascular finding. Skull: Negative Sinuses/Orbits: Clear/normal Other: None IMPRESSION: 3 x 3.5 cm extra-axial mass lesion in the left parietal vertex region with vasogenic edema within the left parietal lobe. This can be seen as a very subtle finding in 2021, smaller and not prospectively visible. This is consistent with a meningioma. I cannot make any statement regarding superior sagittal sinus invasion but that is possible. MRI with contrast would be recommended for further evaluation. Electronically Signed   By: Nelson Chimes M.D.   On: 07/01/2022 13:53   DG Chest Port 1 View  Result Date: 07/01/2022 CLINICAL DATA:  Altered mental status. EXAM: PORTABLE CHEST 1 VIEW COMPARISON:  Chest x-ray dated January 26, 2020. FINDINGS: Chronic cardiomegaly. Normal pulmonary vascularity. Chronic low lung volumes with bibasilar atelectasis. Chronic elevation of the left hemidiaphragm. No pneumothorax or large pleural effusion. No acute osseous abnormality. IMPRESSION: 1. Chronic low lung volumes with bibasilar atelectasis. Electronically Signed   By: Titus Dubin M.D.   On: 07/01/2022 12:40    Procedures Procedures    Medications Ordered in ED Medications  LORazepam (ATIVAN) tablet 0.5 mg (has no administration in time range)  LORazepam (ATIVAN) injection 0.5 mg (has no administration in time range)  metoprolol succinate (TOPROL-XL) 24 hr tablet 25 mg (has no administration in time range)  pravastatin (PRAVACHOL) tablet 20 mg (has no administration in time range)  benztropine (COGENTIN) tablet 1 mg (has no administration in  time range)  mirabegron ER (MYRBETRIQ) tablet 50 mg (has no administration in time range)  enoxaparin (LOVENOX) injection 40 mg (40 mg Subcutaneous Given 07/01/22 1746)  sodium chloride flush (NS) 0.9 % injection 3 mL (has no administration in time range)  acetaminophen (TYLENOL) tablet 650 mg (has no administration in time range)    Or  acetaminophen (TYLENOL) suppository 650 mg (has no administration in time range)  polyethylene glycol (MIRALAX / GLYCOLAX) packet 17 g (has no administration in time range)  levETIRAcetam (KEPPRA) IVPB 500 mg/100 mL  premix (has no administration in time range)  lithium carbonate capsule 150 mg (has no administration in time range)  lithium carbonate capsule 300 mg (300 mg Oral Not Given 07/01/22 1744)  insulin aspart (novoLOG) injection 0-9 Units ( Subcutaneous Not Given 07/01/22 1744)  naloxone [facility]) nasal spray 4 mg/0.1 mL (1 spray Nasal Provided for home use 07/01/22 1233)  dexamethasone (DECADRON) injection 10 mg (10 mg Intravenous Given 07/01/22 1451)  LORazepam (ATIVAN) injection 1 mg (1 mg Intravenous Given 07/01/22 1705)  levETIRAcetam (KEPPRA) IVPB 1000 mg/100 mL premix (0 mg Intravenous Stopped 07/01/22 1532)    ED Course/ Medical Decision Making/ A&P                           Medical Decision Making Amount and/or Complexity of Data Reviewed Labs: ordered. Radiology: ordered.  Risk Prescription drug management. Decision regarding hospitalization.    71 year old female with medical history significant for hypertension, hyperlipidemia, dementia, schizophrenia, DM 2 who presents to the emergency department with altered mental status.  The history is provided by EMS and additional family members bedside.  Family states the patient's baseline is alert and oriented x2, normally interactive and talkative, not somnolent.  On chart review, the patient has not had 2 prior admissions to the hospital in 2020 and 2021 for somnolence, encephalopathy,  thought to be polysubstance abuse in the setting of the patient's multiple home medications that are sedating.  The patient was reportedly found unresponsive at [facility] facility with a last known normal around 1020.  She had 3 episodes of vomiting with EMS and was found to have pinpoint pupils, respirations were normal.  Not administered Narcan, CBG normal with EMS, normal vitals.  The patient's GCS on arrival was 12. She arrived ABC intact.  On arrival, the patient was afebrile, temperature 97.2, not tachycardic P93, mildly tachypneic RR 23, BP 115/77, saturating initially 88% and subsequently 95% on room air.  Patient presenting with somnolence.  No history of seizure activity.  History of admissions previously for thought to be medication induced encephalopathy.  Initial CBG on arrival 112, VBG revealed a pH of 7.30, PCO2 of 52, HCO3 25.8.  Chest x-ray revealed no acute cardiac or pulmonary abnormality.  The patient was administered intranasal Narcan with out any improvement.  No nuchal rigidity, meningeal findings on exam, afebrile, low concern for meningitis or encephalitis.  Considered polypharmacy, electrolyte abnormality, CVA, intracranial hemorrhage, medication induced encephalopathy.  Chest x-ray was unremarkable.  CT head was performed revealed meningioma with evidence of vasogenic edema.   IMPRESSION:  3 x 3.5 cm extra-axial mass lesion in the left parietal vertex  region with vasogenic edema within the left parietal lobe. This can  be seen as a very subtle finding in 2021, smaller and not  prospectively visible. This is consistent with a meningioma. I  cannot make any statement regarding superior sagittal sinus invasion  but that is possible. MRI with contrast would be recommended for  further evaluation.   Patient had a lactic acidosis to greater than 5.  Due to concern for potential seizure activity that was unwitnessed, new meningioma with vasogenic edema, considered seizure  as the initial etiology of the patient's presentation.  IV Keppra was administered 1 g.  The patient was administered 10 mg of IV Decadron.  Family were updated bedside regarding the diagnosis.  EEG was ordered.  Neurology was consulted, discussed patient care with Dr. Curly Shores.  Neurosurgery was consulted for further  recommendations.  Plan at time of signout to follow-up neurosurgery recommendations, plan for likely admission, ultimate disposition pending reassessment of the patient's mental status.  Signout given to Dr. Francia Greaves at 1600.  Final Clinical Impression(s) / ED Diagnoses Final diagnoses:  Meningioma ([place])  Vasogenic edema ([place])  Unresponsiveness    Rx / DC Orders ED Discharge Orders     None         Regan Lemming, MD 07/01/22 1926

## 2022-07-01 NOTE — ED Notes (Signed)
Back from MRI. Pt resting quietly with eyes closed. No alert enough to complete swallow screen for PO meds.

## 2022-07-01 NOTE — H&P (Signed)
History and Physical   Jenna Powell ESP:[id number] DOB: 1951-03-24 DOA: 07/01/2022  PCP: Pcp, No   Patient coming from: [facility] facility  Chief Complaint: Found down, altered mental status  HPI: Jenna Powell is a 71 y.o. female with medical history significant of bipolar disorder, dementia, hypertension, diabetes who was found down at her facility.  History obtained with assistance of chart review.  Patient was found unresponsive at her facility with last known normal around 10:20 AM.  EMS was called and patient vomited 3 times throughout and had  pinpoint pupils.  Patient had improved in her alertness in the ED.  History was obtained with assistance of chart review and family who reported the patient is typically alert and oriented x 2 and interactive and talkative.  History of 2 prior admissions for somnolence that were thought to be due to polypharmacy.  Patient remained drowsy when seen.  Unable to complete full review of systems.  ED Course: Signs in the ED significant for blood pressure in the 633H to 545 systolic, respiratory rate in the teens to 20s.  Lab workup included CMP which is pending.  CBC with platelets 134.  Lactic acid elevated at 5.4 with repeat pending.  TSH normal with T4 pending.  Respiratory panel for flu and COVID-negative.  Urinalysis, UDS, urine culture pending.  Ethanol level negative.  Lithium level 0.54, ammonia level 48.  Blood culture pending.  VBG with normal pH and normal pCO2.  Chest x-ray showed chronic hypoinflation with basilar atelectasis.  CT head showed a 3 x 3.5 cm mass with in the left parietal lobe.  This is increased from previous subtle findings in 2021.  Consistent with meningioma.  Patient received Ativan, Decadron, and Narcan, Keppra in the ED.  Neurosurgery was consulted who recommended MRI and EEG.  Also requesting neurology consultation.  Review of Systems: Remain drowsy when seen unable to complete full review of systems.  Is  alert to voice and does answer some questions appropriately.  Past Medical History:  Diagnosis Date   Dementia ([place])    Diabetes mellitus without complication ([place])    Hyperlipidemia    Hypertension     No past surgical history on file.  Social History  reports that she has never smoked. She has never used smokeless tobacco. She reports that she does not drink alcohol and does not use drugs.  No Known Allergies  No family history on file.  Prior to Admission medications   Medication Sig Start Date End Date Taking? Authorizing Provider  aspirin 81 MG chewable tablet Chew 81 mg by mouth in the morning.   Yes [provider]  benztropine (COGENTIN) 1 MG tablet Take 1 tablet (1 mg total) by mouth 2 (two) times daily. 11/23/18  Yes [place] Donnamarie Poag, DO  Calcium Carb-Cholecalciferol (CALCIUM + VITAMIN D3) 600-10 MG-MCG TABS Take 1 tablet by mouth every 12 (twelve) hours.   Yes [provider]  Cholecalciferol 50 MCG (2000 UT) CAPS Take 2,000 Units by mouth in the morning.   Yes [provider]  clozapine (CLOZARIL) 50 MG tablet Take 75 mg by mouth 2 (two) times daily.   Yes [provider]  Dextran 70-Hypromellose, PF, (GENTEAL TEARS MODERATE PF) 0.1-0.3 % SOLN Place 1 drop into both eyes 3 (three) times daily.   Yes [provider]  docusate sodium (COLACE) 100 MG capsule Take 100 mg by mouth 3 (three) times daily.   Yes [provider]  donepezil (ARICEPT) 10  MG tablet Take 10 mg by mouth at bedtime.   Yes [provider]  famotidine (PEPCID) 20 MG tablet Take 20 mg by mouth at bedtime.   Yes [provider]  lactulose (CHRONULAC) 10 GM/15ML solution Take 20 g by mouth in the morning.   Yes [provider]  lithium carbonate 150 MG capsule Take 150-300 mg by mouth See admin instructions. 2 entries on MAR: 150 mg every morning, 300 mg at bedtime   Yes [provider]  metoprolol succinate (TOPROL-XL)  25 MG 24 hr tablet Take 25 mg by mouth in the morning.   Yes [provider]  mirabegron ER (MYRBETRIQ) 50 MG TB24 tablet Take 50 mg by mouth in the morning.   Yes [provider]  polycarbophil (FIBER-LAX) 625 MG tablet Take 625 mg by mouth 2 (two) times daily.   Yes [provider]  pravastatin (PRAVACHOL) 20 MG tablet Take 20 mg by mouth at bedtime.   Yes [provider]  risperiDONE (RISPERDAL) 3 MG tablet Take 3 mg by mouth at bedtime.   Yes [provider]  Salicylic Acid (SELSUN BLUE [employer]URALS DRY SCALP) 3 % SHAM Apply 1 application  topically See admin instructions. Apply to scalp on shower days (Monday, Wednesday)   Yes [provider]  senna (SENOKOT) 8.6 MG TABS tablet Take 17.2 mg by mouth at bedtime.   Yes [provider]  sertraline (ZOLOFT) 100 MG tablet Take 100 mg by mouth in the morning.   Yes [provider]  traZODone (DESYREL) 50 MG tablet Take 50 mg by mouth at bedtime.   Yes [provider]  risperidone (RISPERDAL) 4 MG tablet Take 0.5 tablets (2 mg total) by mouth 2 (two) times daily. Patient not taking: Reported on 07/01/2022 11/23/18   [place] Donnamarie Poag, DO  traZODone (DESYREL) 100 MG tablet Take 0.5 tablets (50 mg total) by mouth at bedtime. Patient not taking: Reported on 07/01/2022 11/23/18   [place] Eric J, [place]    Physical Exam: Vitals:   07/01/22 1530 07/01/22 1600 07/01/22 1615 07/01/22 1617  BP: 123/77 129/80 (!) 107/93   Pulse: 75 79 87   Resp: (!) '23 18 20   '$ Temp:    98.5 F (36.9 C)  TempSrc:    Rectal  SpO2:  95% 100%     Physical Exam Constitutional:      General: She is not in acute distress.    Appearance: Normal appearance.  HENT:     Head: Normocephalic and atraumatic.     Mouth/Throat:     Mouth: Mucous membranes are moist.     Pharynx: Oropharynx is clear.  Eyes:     Extraocular Movements: Extraocular movements intact.     Pupils: Pupils are equal, round, and  reactive to light.  Cardiovascular:     Rate and Rhythm: Normal rate and regular rhythm.     Pulses: Normal pulses.     Heart sounds: Normal heart sounds.  Pulmonary:     Effort: Pulmonary effort is normal. No respiratory distress.     Breath sounds: Normal breath sounds.  Abdominal:     General: Bowel sounds are normal. There is no distension.     Palpations: Abdomen is soft.     Tenderness: There is no abdominal tenderness.  Musculoskeletal:        General: No swelling or deformity.  Skin:    General: Skin is warm and dry.  Neurological:     General: No  focal deficit present.     Comments: Alert to voice and will answer some questions appropriately.  Is still somewhat confused and somnolent.  Unable to fully participate in neurologic examination.  No gross focal deficits.    Labs on Admission: I have personally reviewed following labs and imaging studies  CBC: Recent Labs  Lab 07/01/22 1225 07/01/22 1237  WBC 5.8  --   NEUTROABS 4.5  --   HGB 12.5 13.3  HCT 41.3 39.0  MCV 90.6  --   PLT 134*  --     Basic Metabolic Panel: Recent Labs  Lab 07/01/22 1237  NA 140  K 4.8    GFR: CrCl cannot be calculated (Patient's most recent lab result is older than the maximum 21 days allowed.).  Liver Function Tests: No results for input(s): "AST", "ALT", "ALKPHOS", "BILITOT", "PROT", "ALBUMIN" in the last 168 hours.  Urine analysis:    Component Value Date/Time   COLORURINE YELLOW 01/26/2020 1602   APPEARANCEUR CLEAR 01/26/2020 1602   LABSPEC 1.006 01/26/2020 1602   PHURINE 6.0 01/26/2020 1602   GLUCOSEU NEGATIVE 01/26/2020 1602   HGBUR NEGATIVE 01/26/2020 1602   BILIRUBINUR NEGATIVE 01/26/2020 1602   KETONESUR NEGATIVE 01/26/2020 1602   PROTEINUR NEGATIVE 01/26/2020 1602   NITRITE NEGATIVE 01/26/2020 1602   LEUKOCYTESUR NEGATIVE 01/26/2020 1602    Radiological Exams on Admission: CT HEAD WO CONTRAST  Result Date: 07/01/2022 CLINICAL DATA:  Mental status change  of unknown cause. EXAM: CT HEAD WITHOUT CONTRAST TECHNIQUE: Contiguous axial images were obtained from the base of the skull through the vertex without intravenous contrast. RADIATION DOSE REDUCTION: This exam was performed according to the departmental dose-optimization program which includes automated exposure control, adjustment of the mA and/or kV according to patient size and/or use of iterative reconstruction technique. COMPARISON:  01/26/2020 FINDINGS: Brain: No abnormality affects the brainstem or cerebellum. The right cerebral hemisphere is normal for age. On the left, there is vasogenic edema in the parietal vertex region. There is a slightly hyperdense extra-axial mass lesion measuring 3 x 3.5 cm in diameter consistent with meningioma. This can be seen as a very subtle finding in 2021, not prospectively visible. This has enlarged. There is a broad surface along the falx. I cannot make any statement regarding superior sagittal sinus invasion but that is possible. MRI with contrast would be recommended for further evaluation. No hydrocephalus. No hemorrhage. No extra-axial fluid collection. Vascular: No abnormal vascular finding. Skull: Negative Sinuses/Orbits: Clear/normal Other: None IMPRESSION: 3 x 3.5 cm extra-axial mass lesion in the left parietal vertex region with vasogenic edema within the left parietal lobe. This can be seen as a very subtle finding in 2021, smaller and not prospectively visible. This is consistent with a meningioma. I cannot make any statement regarding superior sagittal sinus invasion but that is possible. MRI with contrast would be recommended for further evaluation. Electronically Signed   By: Nelson Chimes M.D.   On: 07/01/2022 13:53   DG Chest Port 1 View  Result Date: 07/01/2022 CLINICAL DATA:  Altered mental status. EXAM: PORTABLE CHEST 1 VIEW COMPARISON:  Chest x-ray dated January 26, 2020. FINDINGS: Chronic cardiomegaly. Normal pulmonary vascularity. Chronic low lung  volumes with bibasilar atelectasis. Chronic elevation of the left hemidiaphragm. No pneumothorax or large pleural effusion. No acute osseous abnormality. IMPRESSION: 1. Chronic low lung volumes with bibasilar atelectasis. Electronically Signed   By: Titus Dubin M.D.   On: 07/01/2022 12:40    EKG: Independently reviewed.  Sinus rhythm at  90 bpm.  QTc prolonged at 5 4.  Low voltage multiple leads.  Nonspecific T wave flattening.  Assessment/Plan Principal Problem:   Acute encephalopathy Active Problems:   Dementia without behavioral disturbance (HCC)   Bipolar disorder (HCC)   DM (diabetes mellitus), type 2 ([place])   Essential hypertension   ?Seizure Acute encephalopathy > Patient presenting with acute encephalopathy after being found down at her facility. > Last known well was 10 AM.  She has had previous admissions with somnolence thought to be due to polypharmacy.  She is on multiple sedating medications. > Workup in the ED showed normal white count.  CMP still pending. TSH normal, T4 pending.  Rester panel flu and COVID-negative.  Cultures pending.  Ethanol level negative.  VBG normal.  Lithium level mildly low.  Ammonia level mildly elevated. > Lactic acid was elevated at 5.4 which is being trended.  Increase concern for seizure. > CT head with 3 x 3.5 cm mass consistent with meningioma which w as increased from subtle findings in 2021.  Neurosurgery the noted evidence of some vasogenic edema.  MRI recommended. - Appreciate neurosurgery recommendations - Neurology consult - Monitor on telemetry - Follow-up MRI - Follow-up EEG - Continue with Keppra - Seizure precautions - Follow-up CMP - Trend lactic acid - Hold clozapine, donepezil, risperidone, sertraline, trazodone, lithium, Cogentin  Meningioma > CT head with 3 x 3.5 cm mass consistent with meningioma which was increased from subtle findings in 2021.   > Neurosurgery consulted in the ED and agree with MRI and EEG. > Concern  for possible source of potential seizure leading to altered mentation as above given that there was some vasogenic edema visualized as well. - Appreciate neurosurgery recommendations - MRI/MRV   Bipolar disorder Dementia - As above, holding home clozapine, donepezil, lithium, risperidone, sertraline, trazodone, Cogentin  Hypertension - Continue metoprolol  Diabetes - SSI  DVT prophylaxis: Lovenox Code Status:   Full Family Communication:  Son Updated by phone.  Disposition Plan:   Patient is from:  Nursing facility  Anticipated DC to:  Same as above  Anticipated DC date:  1 to 2 days  Anticipated DC barriers: None  Consults called:  None Admission status:  Observation, telemetry  Severity of Illness: The appropriate patient status for this patient is OBSERVATION. Observation status is judged to be reasonable and necessary in order to provide the required intensity of service to ensure the patient's safety. The patient's presenting symptoms, physical exam findings, and initial radiographic and laboratory data in the context of their medical condition is felt to place them at decreased risk for further clinical deterioration. Furthermore, it is anticipated that the patient will be medically stable for discharge from the hospital within 2 midnights of admission.    Marcelyn Bruins MD [facility]  How to contact the [facility] Attending or Consulting provider [place] or covering provider during after hours [place], for this patient?   Check the care team in [facility] and look for a) attending/consulting [facility] provider listed and b) the [facility] team listed Log into [email or web address] and use [place]'s universal password to access. If you do not have the password, please contact the hospital operator. Locate the [facility] provider you are looking for under [facility] and page to a number that you can be directly reached. If you still have difficulty reaching the provider, please page the [facility]  (Director on Call) for the Hospitalists listed on amion for assistance.  07/01/2022, 4:54 PM

## 2022-07-01 NOTE — ED Notes (Signed)
This RN, RN Stanton Kidney, and NT Lauren were assisting with changing pt. Pt appears to have what looks like prolapsed uterus. MD Mansy made aware of assessment.

## 2022-07-01 NOTE — ED Notes (Signed)
Patient transported to CT 

## 2022-07-01 NOTE — ED Notes (Signed)
MRI called for medication due to pt unable to be still  for MRI.

## 2022-07-01 NOTE — Consult Note (Incomplete)
NEURO HOSPITALIST CONSULT NOTE   Requestig physician: Dr. Trilby Drummer  Reason for Consult: Seizure in the setting of meningioma   History obtained from:  Chart     HPI:                                                                                                                                          Jenna Powell is an 71 y.o. female with a PMHx of dementia, schizophrenia, DM, HLD and HTN who was BIB EMS after being found unresponsive at [facility]. LKN 1020. She vomited x 3 with EMS and was noted to have pinpoint pupils; respirations were normal. Not given Narcan. She was more alert upon arrival to the ED, with GCS of 12. She was afebrile without tachycardia on arrival, but mildly tachypneic. BP was 115/77.   CT head performed in the ED revealed a meningioma in the left parietal vertex region with evidence of adjacent vasogenic edema.   Given labs with elevated lactate > 5, an unwitnessed seizure was considered to be relatively high on the DDx. 1000 mg IV Keppra was given. She was also administered 10 mg IV Decadron. EEG was ordered and Neurology was consulted to further evaluate.   At baseline she is alert and oriented x 2, interactive/talkative and not somnolent. On chart review, the patient has had 2 prior admissions to the hospital in 2020 and 2021 for somnolence/encephalopathy, thought to be due to polypharmacy in the setting of the patient's multiple home medications that were sedating.   Past Medical History:  Diagnosis Date   Dementia ([place])    Diabetes mellitus without complication ([place])    Hyperlipidemia    Hypertension     No past surgical history on file.  No family history on file.           Social History:  reports that she has never smoked. She has never used smokeless tobacco. She reports that she does not drink alcohol and does not use drugs.  No Known Allergies  MEDICATIONS:                                                                                                                      No current facility-administered medications on file prior to encounter.  Current Outpatient Medications on File Prior to Encounter  Medication Sig Dispense Refill   aspirin 81 MG chewable tablet Chew 81 mg by mouth in the morning.     benztropine (COGENTIN) 1 MG tablet Take 1 tablet (1 mg total) by mouth 2 (two) times daily. 60 tablet 0   Calcium Carb-Cholecalciferol (CALCIUM + VITAMIN D3) 600-10 MG-MCG TABS Take 1 tablet by mouth every 12 (twelve) hours.     Cholecalciferol 50 MCG (2000 UT) CAPS Take 2,000 Units by mouth in the morning.     clozapine (CLOZARIL) 50 MG tablet Take 75 mg by mouth 2 (two) times daily.     Dextran 70-Hypromellose, PF, (GENTEAL TEARS MODERATE PF) 0.1-0.3 % SOLN Place 1 drop into both eyes 3 (three) times daily.     docusate sodium (COLACE) 100 MG capsule Take 100 mg by mouth 3 (three) times daily.     donepezil (ARICEPT) 10 MG tablet Take 10 mg by mouth at bedtime.     famotidine (PEPCID) 20 MG tablet Take 20 mg by mouth at bedtime.     lactulose (CHRONULAC) 10 GM/15ML solution Take 20 g by mouth in the morning.     lithium carbonate 150 MG capsule Take 150-300 mg by mouth See admin instructions. 2 entries on MAR: 150 mg every morning, 300 mg at bedtime     metoprolol succinate (TOPROL-XL) 25 MG 24 hr tablet Take 25 mg by mouth in the morning.     mirabegron ER (MYRBETRIQ) 50 MG TB24 tablet Take 50 mg by mouth in the morning.     polycarbophil (FIBER-LAX) 625 MG tablet Take 625 mg by mouth 2 (two) times daily.     pravastatin (PRAVACHOL) 20 MG tablet Take 20 mg by mouth at bedtime.     risperiDONE (RISPERDAL) 3 MG tablet Take 3 mg by mouth at bedtime.     Salicylic Acid (SELSUN BLUE NATURALS DRY SCALP) 3 % SHAM Apply 1 application  topically See admin instructions. Apply to scalp on shower days (Monday, Wednesday)     senna (SENOKOT) 8.6 MG TABS tablet Take 17.2 mg by mouth at bedtime.     sertraline  (ZOLOFT) 100 MG tablet Take 100 mg by mouth in the morning.     traZODone (DESYREL) 50 MG tablet Take 50 mg by mouth at bedtime.      Scheduled:  benztropine  1 mg Oral BID   enoxaparin (LOVENOX) injection  40 mg Subcutaneous Q24H   insulin aspart  0-9 Units Subcutaneous TID WC   [START ON 07/02/2022] lithium carbonate  150 mg Oral Q breakfast   lithium carbonate  300 mg Oral Q supper   [START ON 07/02/2022] metoprolol succinate  25 mg Oral Daily   [START ON 07/02/2022] mirabegron ER  50 mg Oral Daily   pravastatin  20 mg Oral QHS   sodium chloride flush  3 mL Intravenous Q12H   Continuous:  [START ON 07/02/2022] levETIRAcetam       ROS:  Unable to obtain due to lethargy.    Blood pressure 137/83, pulse 89, temperature 98.1 F (36.7 C), temperature source Oral, resp. rate 17, SpO2 96 %.   General Examination:                                                                                                       Physical Exam  HEENT-  [place]/AT. No nuchal rigidity    Lungs- Respirations unlabored Extremities- Warm and well perfused  Neurological Examination Mental Status: Lethargic with mild agitation and ability to localize with LUE during sternal rub. Patient perseverates "yes" or mumbles with her eyes closed to all questions. No other verbal output and does not follow any commands. Briefly opens eyes part way and makes eye contact with examiner during sternal rub.   Cranial Nerves: II: Weak blink to threat with eyelids held open by examiner if visual threat is performed during arousal with sternal rub. Otherwise does not blink to threat. Pinpoint pupils bilaterally.   III,IV, VI: Eyes are conjugate near the midline. No nystagmus. Does not track visual stimuli, but did briefly awaken and fixate on examiner's face during sternal rub. Doll's eye reflex  is suppressed.  V: Closes eyes in response to eyelid stimulation.  VII: No facial droop noted resting or during mumbled responses to questions and noxious stimuli.  VIII: Hearing intact to voice.  IX,X: Phonation intact.  XI: Unable to assess XII: Does not follow commands for assessment Motor/Sensory: Localizes purposefully to sternal rub with LUE but not RUE. Moves LUE to pinch.  Will move RUE to noxious pinch but with lower amplitude than on the left.  Withdraws BLE semipurposefully to noxious plantar stimulation without asymmetry.  Deep Tendon Reflexes: 1+ bilateral brachioradialis. Unable to assess patellar reflexes as not holding still when legs are touched to assess reflexes.  Plantars: Right: downgoing   Left: downgoing Cerebellar/Gait: Unable to assess   Lab Results: Basic Metabolic Panel: Recent Labs  Lab 07/01/22 1237  NA 140  K 4.8    CBC: Recent Labs  Lab 07/01/22 1225 07/01/22 1237  WBC 5.8  --   NEUTROABS 4.5  --   HGB 12.5 13.3  HCT 41.3 39.0  MCV 90.6  --   PLT 134*  --     Cardiac Enzymes: No results for input(s): "CKTOTAL", "CKMB", "CKMBINDEX", "TROPONINI" in the last 168 hours.  Lipid Panel: No results for input(s): "CHOL", "TRIG", "HDL", "CHOLHDL", "VLDL", "LDLCALC" in the last 168 hours.  Imaging: EEG adult  Result Date: 07/01/2022 Lora Havens, MD     07/01/2022  8:58 PM Patient Name: Jenna Powell MRN: [id number] Epilepsy Attending: Lora Havens Referring Physician/Provider: Regan Lemming, MD Date: 07/01/2022 Duration: 24.16 mins Patient history: 71 y.o. female with DM, bipolar disorder, schizophrenia presented to the hospital after an episode of syncope. EEG to evaluate for seizure. Level of alertness: Awake, asleep AEDs during EEG study: LEV, Ativan Technical aspects: This EEG study was done with scalp electrodes positioned according to the 10-20 International system of electrode placement. Electrical activity was reviewed with  band  pass filter of 1-'70Hz'$ , sensitivity of 7 uV/mm, display speed of 7m/sec with a '60Hz'$  notched filter applied as appropriate. EEG data were recorded continuously and digitally stored.  Video monitoring was available and reviewed as appropriate. Description: No clear posterior dominant rhythm was seen. Sleep was characterized by sleep spindles (12 to 14 Hz), maximal frontocentral region.  EEG showed continuous generalized 3 to 7 Hz theta-delta slowing. Hyperventilation and photic stimulation were not performed.   ABNORMALITY - Continuous slow, generalized IMPRESSION: This study is suggestive of moderate diffuse encephalopathy, nonspecific etiology. No seizures or epileptiform discharges were seen throughout the recording. Priyanka OBarbra Sarks  CT HEAD WO CONTRAST  Result Date: 07/01/2022 CLINICAL DATA:  Mental status change of unknown cause. EXAM: CT HEAD WITHOUT CONTRAST TECHNIQUE: Contiguous axial images were obtained from the base of the skull through the vertex without intravenous contrast. RADIATION DOSE REDUCTION: This exam was performed according to the departmental dose-optimization program which includes automated exposure control, adjustment of the mA and/or kV according to patient size and/or use of iterative reconstruction technique. COMPARISON:  01/26/2020 FINDINGS: Brain: No abnormality affects the brainstem or cerebellum. The right cerebral hemisphere is normal for age. On the left, there is vasogenic edema in the parietal vertex region. There is a slightly hyperdense extra-axial mass lesion measuring 3 x 3.5 cm in diameter consistent with meningioma. This can be seen as a very subtle finding in 2021, not prospectively visible. This has enlarged. There is a broad surface along the falx. I cannot make any statement regarding superior sagittal sinus invasion but that is possible. MRI with contrast would be recommended for further evaluation. No hydrocephalus. No hemorrhage. No extra-axial fluid  collection. Vascular: No abnormal vascular finding. Skull: Negative Sinuses/Orbits: Clear/normal Other: None IMPRESSION: 3 x 3.5 cm extra-axial mass lesion in the left parietal vertex region with vasogenic edema within the left parietal lobe. This can be seen as a very subtle finding in 2021, smaller and not prospectively visible. This is consistent with a meningioma. I cannot make any statement regarding superior sagittal sinus invasion but that is possible. MRI with contrast would be recommended for further evaluation. Electronically Signed   By: MNelson ChimesM.D.   On: 07/01/2022 13:53   DG Chest Port 1 View  Result Date: 07/01/2022 CLINICAL DATA:  Altered mental status. EXAM: PORTABLE CHEST 1 VIEW COMPARISON:  Chest x-ray dated January 26, 2020. FINDINGS: Chronic cardiomegaly. Normal pulmonary vascularity. Chronic low lung volumes with bibasilar atelectasis. Chronic elevation of the left hemidiaphragm. No pneumothorax or large pleural effusion. No acute osseous abnormality. IMPRESSION: 1. Chronic low lung volumes with bibasilar atelectasis. Electronically Signed   By: WTitus DubinM.D.   On: 07/01/2022 12:40     Assessment: 71year old female with a PMHx of dementia, schizophrenia, DM, HLD and HTN who was BIB EMS [facility]. She vomited x 3 with EMS and was noted to have pinpoint pupils; respirations were normal. [facility] Given labs with elevated lactate > 5, an unwitnessed seizure was considered to be relatively high on the DDx. 1000 mg IV Keppra was given. She was also administered 10 mg IV Decadron. EEG was ordered and Neurology was consulted to further evaluate.   - Exam reveals a lethargic patient who perseverates "yes" to almost all questions and is able to localize to pain. No jerking, twitching or other clinical seizure-like activity noted.  - CT  head: 3 x 3.5 cm extra-axial mass lesion in the left parietal vertex  region with vasogenic edema within the left parietal lobe. This can be seen as a very subtle finding in 2021, smaller and not prospectively visible. This is consistent with a meningioma. Radiology cannot make any statement regarding superior sagittal sinus invasion but that is possible - EEG:  Continuous slow, generalized. This study is suggestive of moderate diffuse encephalopathy, nonspecific to etiology. No seizures or epileptiform discharges were seen throughout the recording. - Labs: - Ammonia elevated at 48 - Lactate elevated at 5.4 on labs obtained shortly after arrival - No leukocytosis on CBC - EtOH < 10 - Lithium level not toxic, but slightly below therapeutic (0.54)   Recommendations: - Neurosurgery is following the patient.  - Agree with Neurosurgery recommendation to obtain MRI brain with and without contrast and MRV for further characterization. However, will need to verify that renal function is sufficient for patient to tolerate IV gadolinium contrast - Creatinine level was ordered but has not yet been drawn. Cr level changed to STAT.  - If Cr is too low, then obtain MRI without contrast.  - Continue Keppra at 500 mg IV BID - Further recommendations pending MRI   Electronically signed: Dr. Kerney Elbe 07/01/2022, 10:19 PM

## 2022-07-01 NOTE — Procedures (Signed)
Patient Name: Jenna Powell  MRN: [id number]  Epilepsy Attending: Lora Havens  Referring Physician/Provider: Regan Lemming, MD  Date: 07/01/2022 Duration: 24.16 mins  Patient history: 71 y.o. female with DM, bipolar disorder, schizophrenia presented to the hospital after an episode of syncope. EEG to evaluate for seizure.  Level of alertness: Awake, asleep  AEDs during EEG study: LEV, Ativan  Technical aspects: This EEG study was done with scalp electrodes positioned according to the 10-20 International system of electrode placement. Electrical activity was reviewed with band pass filter of 1-'70Hz'$ , sensitivity of 7 uV/mm, display speed of 64m/sec with a '60Hz'$  notched filter applied as appropriate. EEG data were recorded continuously and digitally stored.  Video monitoring was available and reviewed as appropriate.  Description: No clear posterior dominant rhythm was seen. Sleep was characterized by sleep spindles (12 to 14 Hz), maximal frontocentral region.  EEG showed continuous generalized 3 to 7 Hz theta-delta slowing. Hyperventilation and photic stimulation were not performed.     ABNORMALITY - Continuous slow, generalized  IMPRESSION: This study is suggestive of moderate diffuse encephalopathy, nonspecific etiology. No seizures or epileptiform discharges were seen throughout the recording.  Susie Ehresman OBarbra Sarks

## 2022-07-01 NOTE — ED Triage Notes (Signed)
[facility] after being found unresponsive at [facility] [place], vomited x3 with EMS, pinpoint pupils, Pt more alert upon arrival to ED

## 2022-07-01 NOTE — ED Notes (Signed)
This RN spoke with MD [facility] regarding concerns for prolapsed uterus. Order to monitor urinary output, signs of bleeding, and to assess for pain coming from area. Notify MD [place]y if pain medication is needed. MD requested that this RN make oncoming shift aware of assessment.

## 2022-07-01 NOTE — Consult Note (Signed)
CC: syncope  HPI:     Patient is a 71 y.o. female with DM, bipolar disorder, schizophrenia presented to the hospital after an episode of syncope.  Per the patient and family, no preceding or warning symptoms.  No recent sleep deprivation, substance abuse.  She remains somewhat sleepy.  No history of cancer.  She is a nonsmoker.    Patient Active Problem List   Diagnosis Date Noted   Somnolence 01/26/2020   Polypharmacy 11/23/2018   Dementia without behavioral disturbance ([place]) 11/22/2018   Bipolar disorder ([place]) 11/22/2018   DM (diabetes mellitus), type 2 ([place]) 11/22/2018   Essential hypertension 11/22/2018   Past Medical History:  Diagnosis Date   Dementia ([place])    Diabetes mellitus without complication ([place])    Hyperlipidemia    Hypertension     No past surgical history on file.  (Not in a hospital admission)  No Known Allergies  Social History   Tobacco Use   Smoking status: Never   Smokeless tobacco: Never  Substance Use Topics   Alcohol use: No    No family history on file.   Review of Systems Review of systems not obtained due to patient factors.  Objective:   Patient Vitals for the past 8 hrs:  BP Temp Temp src Pulse Resp SpO2  07/01/22 1617 -- 98.5 F (36.9 C) Rectal -- -- --  07/01/22 1600 129/80 -- -- 79 18 95 %  07/01/22 1530 123/77 -- -- 75 (!) 23 --  07/01/22 1500 (!) 140/80 -- -- 76 (!) 22 95 %  07/01/22 1430 (!) 143/80 -- -- 69 16 96 %  07/01/22 1400 (!) 131/90 -- -- 75 19 97 %  07/01/22 1230 119/74 -- -- 85 (!) 24 95 %  07/01/22 1217 115/77 (!) 97.2 F (36.2 C) Axillary 93 (!) 23 95 %  07/01/22 1211 -- -- -- -- -- (!) 88 %   No intake/output data recorded. Total I/O In: 93.2 [IV Piggyback:93.2] Out: -       General : sleepy, cooperative, no distress, appears stated age   Head:  Normocephalic/atraumatic    Eyes: PERRL, conjunctiva/corneas clear, EOM's intact. Fundi could not be visualized Neck: Supple Chest:  Respirations  unlabored Chest wall: no tenderness or deformity Heart: Regular rate and rhythm Abdomen: Soft, nontender and nondistended Extremities: warm and well-perfused Skin: normal turgor, color and texture Neurologic:  Alert, oriented to person, hospital.  Eyes open to voice. PERRL, EOMI, VFC, no facial droop. V1-3 intact.  No dysarthria, tongue protrusion symmetric.  CNII-XII intact. Normal strength, sensation and reflexes throughout.  No pronator drift, full strength in legs grossly, though somewhat limited cooperation with exam        Data ReviewCBC:  Lab Results  Component Value Date   WBC 5.8 07/01/2022   RBC 4.56 07/01/2022   BMP:  Lab Results  Component Value Date   GLUCOSE 107 (H) 06/13/2021   CO2 28 06/13/2021   BUN 15 06/13/2021   CREATININE 1.06 (H) 06/13/2021   CALCIUM 9.7 06/13/2021   Radiology review:  CT head without contrast reviewed.  Likely left parietal parasagittal meningioma, medium sized 3.3 cm. There is likely involvement of sagittal sinus wall.  Some underlying vasogenic edema apparent.   On 2021 CT, I cannot clearly identify it, though as it is isodense to brain, the exam is limited in detecting it.  Assessment:   Active Problems:   Dementia without behavioral disturbance ([facility])   Bipolar disorder ([facility])   DM (  diabetes mellitus), type 2 ([place])   Essential hypertension  [name] with bipolar/schizophrenia, DM who suffered a possible seizure, appears to be postictal.  She has a medium sized extraaxial brain mass consistent with meningioma.  There is some vasogenic edema which suggests it may be epileptogenic  Plan:   - I have recommended MRI brain with and without contrast and MRV for further characterization. - recommend neurology eval, EEG, and AEDs

## 2022-07-01 NOTE — ED Provider Notes (Signed)
Neurosurgery - [facility]o [facility] - made aware of case.  They will consult.  Patient and patient's family at bedside understand plan of care.  Patient's mental status is improving during ED evaluation.  Hospitalist service made aware of case and will evaluate for admission.   Valarie Merino, MD 07/01/22 1640

## 2022-07-01 NOTE — Progress Notes (Signed)
EEG complete - results pending 

## 2022-07-02 ENCOUNTER — Observation Stay (HOSPITAL_COMMUNITY): Payer: Medicare Other

## 2022-07-02 DIAGNOSIS — G9340 Encephalopathy, unspecified: Secondary | ICD-10-CM | POA: Diagnosis not present

## 2022-07-02 DIAGNOSIS — F319 Bipolar disorder, unspecified: Secondary | ICD-10-CM | POA: Diagnosis present

## 2022-07-02 DIAGNOSIS — R4189 Other symptoms and signs involving cognitive functions and awareness: Secondary | ICD-10-CM | POA: Diagnosis not present

## 2022-07-02 DIAGNOSIS — F0393 Unspecified dementia, unspecified severity, with mood disturbance: Secondary | ICD-10-CM | POA: Diagnosis present

## 2022-07-02 DIAGNOSIS — Z7982 Long term (current) use of aspirin: Secondary | ICD-10-CM | POA: Diagnosis not present

## 2022-07-02 DIAGNOSIS — Z1152 Encounter for screening for COVID-19: Secondary | ICD-10-CM | POA: Diagnosis not present

## 2022-07-02 DIAGNOSIS — G936 Cerebral edema: Secondary | ICD-10-CM | POA: Diagnosis present

## 2022-07-02 DIAGNOSIS — R569 Unspecified convulsions: Secondary | ICD-10-CM | POA: Diagnosis present

## 2022-07-02 DIAGNOSIS — D320 Benign neoplasm of cerebral meninges: Secondary | ICD-10-CM | POA: Diagnosis present

## 2022-07-02 DIAGNOSIS — R471 Dysarthria and anarthria: Secondary | ICD-10-CM | POA: Diagnosis not present

## 2022-07-02 DIAGNOSIS — G9341 Metabolic encephalopathy: Secondary | ICD-10-CM | POA: Diagnosis present

## 2022-07-02 DIAGNOSIS — E119 Type 2 diabetes mellitus without complications: Secondary | ICD-10-CM | POA: Diagnosis not present

## 2022-07-02 DIAGNOSIS — C709 Malignant neoplasm of meninges, unspecified: Secondary | ICD-10-CM | POA: Diagnosis not present

## 2022-07-02 DIAGNOSIS — Z603 Acculturation difficulty: Secondary | ICD-10-CM | POA: Diagnosis present

## 2022-07-02 DIAGNOSIS — J9811 Atelectasis: Secondary | ICD-10-CM | POA: Diagnosis present

## 2022-07-02 DIAGNOSIS — E669 Obesity, unspecified: Secondary | ICD-10-CM | POA: Diagnosis not present

## 2022-07-02 DIAGNOSIS — E785 Hyperlipidemia, unspecified: Secondary | ICD-10-CM | POA: Diagnosis present

## 2022-07-02 DIAGNOSIS — Z79899 Other long term (current) drug therapy: Secondary | ICD-10-CM | POA: Diagnosis not present

## 2022-07-02 DIAGNOSIS — D329 Benign neoplasm of meninges, unspecified: Secondary | ICD-10-CM | POA: Diagnosis present

## 2022-07-02 DIAGNOSIS — I10 Essential (primary) hypertension: Secondary | ICD-10-CM | POA: Diagnosis not present

## 2022-07-02 DIAGNOSIS — F209 Schizophrenia, unspecified: Secondary | ICD-10-CM | POA: Diagnosis present

## 2022-07-02 DIAGNOSIS — E872 Acidosis, unspecified: Secondary | ICD-10-CM | POA: Diagnosis present

## 2022-07-02 LAB — CBC
HCT: 43.4 % (ref 36.0–46.0)
Hemoglobin: 13.5 g/dL (ref 12.0–15.0)
MCH: 27.5 pg (ref 26.0–34.0)
MCHC: 31.1 g/dL (ref 30.0–36.0)
MCV: 88.4 fL (ref 80.0–100.0)
Platelets: 194 10*3/uL (ref 150–400)
RBC: 4.91 MIL/uL (ref 3.87–5.11)
RDW: 12.6 % (ref 11.5–15.5)
WBC: 9.2 10*3/uL (ref 4.0–10.5)
nRBC: 0 % (ref 0.0–0.2)

## 2022-07-02 LAB — COMPREHENSIVE METABOLIC PANEL
ALT: 21 U/L (ref 0–44)
AST: 19 U/L (ref 15–41)
Albumin: 3.7 g/dL (ref 3.5–5.0)
Alkaline Phosphatase: 89 U/L (ref 38–126)
Anion gap: 9 (ref 5–15)
BUN: 12 mg/dL (ref 8–23)
CO2: 23 mmol/L (ref 22–32)
Calcium: 9.7 mg/dL (ref 8.9–10.3)
Chloride: 105 mmol/L (ref 98–111)
Creatinine, Ser: 1 mg/dL (ref 0.44–1.00)
GFR, Estimated: >60 mL/min (ref >60)
Glucose, Bld: 117 mg/dL — ABNORMAL HIGH (ref 70–99)
Potassium: 4.6 mmol/L (ref 3.5–5.1)
Sodium: 137 mmol/L (ref 135–145)
Total Bilirubin: 0.4 mg/dL (ref 0.3–1.2)
Total Protein: 6.5 g/dL (ref 6.5–8.1)

## 2022-07-02 LAB — CBG MONITORING, ED
Glucose-Capillary: 91 mg/dL (ref 70–99)
Glucose-Capillary: 81 mg/dL (ref 70–99)

## 2022-07-02 LAB — GLUCOSE, CAPILLARY
Glucose-Capillary: 115 mg/dL — ABNORMAL HIGH (ref 70–99)
Glucose-Capillary: 152 mg/dL — ABNORMAL HIGH (ref 70–99)

## 2022-07-02 LAB — HIV ANTIBODY (ROUTINE TESTING W REFLEX): HIV Screen 4th Generation wRfx: NONREACTIVE

## 2022-07-02 NOTE — ED Notes (Signed)
Pt was unable to stay still for MRI. MD notified. Pt returning to ED.

## 2022-07-02 NOTE — ED Notes (Signed)
Brief and external urinary catheter applied to monitor output. Prolapsed uterus appears pink, no bleeding, sheets are dry. Pt minimally responsive to any stimuli.

## 2022-07-02 NOTE — ED Notes (Signed)
Pt back from MRI 

## 2022-07-02 NOTE — Progress Notes (Signed)
PROGRESS NOTE    Jenna Powell  FBP:[id number] DOB: 1950-10-15 DOA: 07/01/2022 PCP: Merryl Hacker, No   Brief Narrative:  Jenna Powell is a 71 y.o. female with medical history significant of bipolar disorder, dementia, hypertension, diabetes who was found down at her facility.  Initial history obtained from facility and chart review, patient's mental status appears to be limited at baseline.  Patient episodes of vomiting with pinpoint pupils per facility found to be minimally responsive.  In the ED patient's mental status continued to wax and wane minimally improving, admitted for seizure evaluation by hospitalist team, neurology consulting.  Neurosurgery consulted due to mass as noted on CT.  Assessment & Plan:   Principal Problem:   Acute encephalopathy Active Problems:   Dementia without behavioral disturbance (HCC)   Bipolar disorder (HCC)   DM (diabetes mellitus), type 2 ([place])   Essential hypertension   Meningioma (HCC)   Vasogenic edema (HCC)  Questionable acute onset seizure Acute encephalopathy, improving Rule out polypharmacy -Found down at facility with emesis and pinpoint pupils, last known well 10 AM the day of admission -Initial concern for polypharmacy on multiple sedating medications -CT head shows 3 x 3.5 cm mass concerning for meningioma with questionable vasogenic edema, similar in appearance to 2021 imaging if not somewhat larger -MRI ordered, unable to obtain overnight due to patient's agitation and movement -Neurology and neurosurgery consulted, appreciate insight and recommendations -Initiated Keppra per neurology - Hold clozapine, donepezil, risperidone, sertraline, trazodone, lithium, Cogentin given concern for polypharmacy   Meningioma -CT head with 3 x 3.5 cm mass consistent with meningioma which was increased from subtle findings in 2021.   -Neurosurgery consulted, agree with EEG and MRI -MRI somewhat difficult to obtain given patient's mental status,  attempt again today with IV Ativan for sedation -if unable to obtain will discuss with neurosurgery potential need for anesthesia to obtain imaging versus necessity if she is not a surgical candidate   Bipolar disorder Dementia - As above, holding home clozapine, donepezil, lithium, risperidone, sertraline, trazodone, Cogentin   Hypertension - Continue metoprolol   Diabetes - SSI   DVT prophylaxis:      Lovenox Code Status:              Full Family Communication: Son updated by phone  Status is: Inpatient  Dispo: The patient is from: Facility              Anticipated d/c is to: Same              Anticipated d/c date is: 48 to 72 hours pending clinical course              Patient currently not medically stable for discharge  Consultants:  Neurosurgery, neurology  Procedures:  None yet  Antimicrobials:  Not indicated  Subjective: No acute issues or events overnight, patient's mental status continues to wax and wane, able to follow commands this morning, conversive but difficult to understand, somewhat concerning for flight of ideas versus aphasia   Objective: Vitals:   07/02/22 0230 07/02/22 0505 07/02/22 0510 07/02/22 0519  BP: 135/76 131/85 121/87   Pulse: 75 70 65   Resp: (!) '23 15 14   '$ Temp:    97.8 F (36.6 C)  TempSrc:    Axillary  SpO2: 95% 97% 95%     Intake/Output Summary (Last 24 hours) at 07/02/2022 0810 Last data filed at 07/02/2022 0523 Gross per 24 hour  Intake 93.24 ml  Output --  Net 93.24  ml   There were no vitals filed for this visit.  Examination:  General:  Pleasantly resting in bed, No acute distress. HEENT:  Normocephalic atraumatic.  Sclerae nonicteric, noninjected.  Extraocular movements intact bilaterally. Neck:  Without mass or deformity.  Trachea is midline. Lungs:  Clear to auscultate bilaterally without rhonchi, wheeze, or rales. Heart:  Regular rate and rhythm.  Without murmurs, rubs, or gallops. Abdomen:  Soft, nontender,  nondistended.  Without guarding or rebound. Extremities: Without cyanosis, clubbing, edema, or obvious deformity. Vascular:  Dorsalis pedis and posterior tibial pulses palpable bilaterally. Skin:  Warm and dry, no erythema, no ulcerations.   Data Reviewed: I have personally reviewed following labs and imaging studies  CBC: Recent Labs  Lab 07/01/22 1225 07/01/22 1237 07/02/22 0515  WBC 5.8  --  9.2  NEUTROABS 4.5  --   --   HGB 12.5 13.3 13.5  HCT 41.3 39.0 43.4  MCV 90.6  --  88.4  PLT 134*  --  976   Basic Metabolic Panel: Recent Labs  Lab 07/01/22 1237 07/02/22 0515  NA 140 137  K 4.8 4.6  CL  --  105  CO2  --  23  GLUCOSE  --  117*  BUN  --  12  CREATININE  --  1.00  CALCIUM  --  9.7   GFR: CrCl cannot be calculated (Unknown ideal weight.). Liver Function Tests: Recent Labs  Lab 07/02/22 0515  AST 19  ALT 21  ALKPHOS 89  BILITOT 0.4  PROT 6.5  ALBUMIN 3.7   No results for input(s): "LIPASE", "AMYLASE" in the last 168 hours. Recent Labs  Lab 07/01/22 1515  AMMONIA 48*   Coagulation Profile: No results for input(s): "INR", "PROTIME" in the last 168 hours. Cardiac Enzymes: No results for input(s): "CKTOTAL", "CKMB", "CKMBINDEX", "TROPONINI" in the last 168 hours. BNP (last 3 results) No results for input(s): "PROBNP" in the last 8760 hours. HbA1C: No results for input(s): "HGBA1C" in the last 72 hours. CBG: Recent Labs  Lab 07/01/22 1240 07/01/22 1743 07/01/22 2345  GLUCAP 112* 137* 130*   Lipid Profile: No results for input(s): "CHOL", "HDL", "LDLCALC", "TRIG", "CHOLHDL", "LDLDIRECT" in the last 72 hours. Thyroid Function Tests: Recent Labs    07/01/22 1225  TSH 1.306   Anemia Panel: No results for input(s): "VITAMINB12", "FOLATE", "FERRITIN", "TIBC", "IRON", "RETICCTPCT" in the last 72 hours. Sepsis Labs: Recent Labs  Lab 07/01/22 1225  LATICACIDVEN 5.4*    Recent Results (from the past 240 hour(s))  Blood Culture (Routine X  2)     Status: None (Preliminary result)   Collection Time: 07/01/22 12:21 PM   Specimen: BLOOD  Result Value Ref Range Status   Specimen Description BLOOD SITE NOT SPECIFIED  Final   Special Requests   Final    BOTTLES DRAWN AEROBIC AND ANAEROBIC Blood Culture results may not be optimal due to an inadequate volume of blood received in culture bottles   Culture   Final    NO GROWTH < 24 HOURS Performed at [facility], [place] [address], [place], [place] [address]    Report Status PENDING  Incomplete  Blood Culture (Routine X 2)     Status: Abnormal (Preliminary result)   Collection Time: 07/01/22 12:26 PM   Specimen: BLOOD  Result Value Ref Range Status   Specimen Description BLOOD SITE NOT SPECIFIED  Final   Special Requests (A)  Final    AEROCOCCUS SPECIES Blood Culture results may not be  optimal due to an inadequate volume of blood received in culture bottles   Culture   Final    NO GROWTH < 24 HOURS Performed at [facility] [address], [place], [place] [address]    Report Status PENDING  Incomplete  Resp Panel by RT-PCR (Flu A&B, Covid) Anterior Nasal Swab     Status: None   Collection Time: Jul 03, 2022  3:15 PM   Specimen: Anterior Nasal Swab  Result Value Ref Range Status   SARS Coronavirus 2 by RT PCR NEGATIVE NEGATIVE Final    Comment: (NOTE) SARS-CoV-2 target nucleic acids are NOT DETECTED.  The SARS-CoV-2 RNA is generally detectable in upper respiratory specimens during the acute phase of infection. The lowest concentration of SARS-CoV-2 viral copies this assay can detect is 138 copies/mL. A negative result does not preclude SARS-Cov-2 infection and should not be used as the sole basis for treatment or other patient management decisions. A negative result may occur with  improper specimen collection/handling, submission of specimen other than nasopharyngeal swab, presence of viral mutation(s) within the areas targeted by this assay, and inadequate number  of viral copies(<138 copies/mL). A negative result must be combined with clinical observations, patient history, and epidemiological information. The expected result is Negative.  Fact Sheet for Patients:  EntrepreneurPulse.com.au  Fact Sheet for Healthcare Providers:  IncredibleEmployment.be  This test is no t yet approved or cleared by the [place] FDA and  has been authorized for detection and/or diagnosis of SARS-CoV-2 by FDA under an Emergency Use Authorization (EUA). This EUA will remain  in effect (meaning this test can be used) for the duration of the COVID-19 declaration under Section 564(b)(1) of the Act, 21 U.S.C.section 360bbb-3(b)(1), unless the authorization is terminated  or revoked sooner.       Influenza A by PCR NEGATIVE NEGATIVE Final   Influenza B by PCR NEGATIVE NEGATIVE Final    Comment: (NOTE) The Xpert Xpress SARS-CoV-2/FLU/RSV plus assay is intended as an aid in the diagnosis of influenza from Nasopharyngeal swab specimens and should not be used as a sole basis for treatment. Nasal washings and aspirates are unacceptable for Xpert Xpress SARS-CoV-2/FLU/RSV testing.  Fact Sheet for Patients: EntrepreneurPulse.com.au  Fact Sheet for Healthcare Providers: IncredibleEmployment.be  This test is not yet approved or cleared by the [place] FDA and has been authorized for detection and/or diagnosis of SARS-CoV-2 by FDA under an Emergency Use Authorization (EUA). This EUA will remain in effect (meaning this test can be used) for the duration of the COVID-19 declaration under Section 564(b)(1) of the Act, 21 U.S.C. section 360bbb-3(b)(1), unless the authorization is terminated or revoked.  Performed at [facility], [place] [address] [place], [place] [address]          Radiology Studies: EEG adult  Result Date: 03-Jul-2022 Lora Havens, MD     07/03/22   8:58 PM Patient Name: Jenna Powell MRN: [id number] Epilepsy Attending: Lora Havens Referring Physician/Provider: Regan Lemming, MD Date: 03-Jul-2022 Duration: 24.16 mins Patient history: 71 y.o. female with DM, bipolar disorder, schizophrenia presented to the hospital after an episode of syncope. EEG to evaluate for seizure. Level of alertness: Awake, asleep AEDs during EEG study: LEV, Ativan Technical aspects: This EEG study was done with scalp electrodes positioned according to the 10-20 International system of electrode placement. Electrical activity was reviewed with band pass filter of 1-'70Hz'$ , sensitivity of 7 uV/mm, display speed of 50m/sec with a '60Hz'$  notched filter applied as  appropriate. EEG data were recorded continuously and digitally stored.  Video monitoring was available and reviewed as appropriate. Description: No clear posterior dominant rhythm was seen. Sleep was characterized by sleep spindles (12 to 14 Hz), maximal frontocentral region.  EEG showed continuous generalized 3 to 7 Hz theta-delta slowing. Hyperventilation and photic stimulation were not performed.   ABNORMALITY - Continuous slow, generalized IMPRESSION: This study is suggestive of moderate diffuse encephalopathy, nonspecific etiology. No seizures or epileptiform discharges were seen throughout the recording. Priyanka Barbra Sarks   CT HEAD WO CONTRAST  Result Date: 07/01/2022 CLINICAL DATA:  Mental status change of unknown cause. EXAM: CT HEAD WITHOUT CONTRAST TECHNIQUE: Contiguous axial images were obtained from the base of the skull through the vertex without intravenous contrast. RADIATION DOSE REDUCTION: This exam was performed according to the departmental dose-optimization program which includes automated exposure control, adjustment of the mA and/or kV according to patient size and/or use of iterative reconstruction technique. COMPARISON:  01/26/2020 FINDINGS: Brain: No abnormality affects the brainstem or cerebellum.  The right cerebral hemisphere is normal for age. On the left, there is vasogenic edema in the parietal vertex region. There is a slightly hyperdense extra-axial mass lesion measuring 3 x 3.5 cm in diameter consistent with meningioma. This can be seen as a very subtle finding in 2021, not prospectively visible. This has enlarged. There is a broad surface along the falx. I cannot make any statement regarding superior sagittal sinus invasion but that is possible. MRI with contrast would be recommended for further evaluation. No hydrocephalus. No hemorrhage. No extra-axial fluid collection. Vascular: No abnormal vascular finding. Skull: Negative Sinuses/Orbits: Clear/normal Other: None IMPRESSION: 3 x 3.5 cm extra-axial mass lesion in the left parietal vertex region with vasogenic edema within the left parietal lobe. This can be seen as a very subtle finding in 2021, smaller and not prospectively visible. This is consistent with a meningioma. I cannot make any statement regarding superior sagittal sinus invasion but that is possible. MRI with contrast would be recommended for further evaluation. Electronically Signed   By: Nelson Chimes M.D.   On: 07/01/2022 13:53   DG Chest Port 1 View  Result Date: 07/01/2022 CLINICAL DATA:  Altered mental status. EXAM: PORTABLE CHEST 1 VIEW COMPARISON:  Chest x-ray dated January 26, 2020. FINDINGS: Chronic cardiomegaly. Normal pulmonary vascularity. Chronic low lung volumes with bibasilar atelectasis. Chronic elevation of the left hemidiaphragm. No pneumothorax or large pleural effusion. No acute osseous abnormality. IMPRESSION: 1. Chronic low lung volumes with bibasilar atelectasis. Electronically Signed   By: Titus Dubin M.D.   On: 07/01/2022 12:40     Scheduled Meds:  benztropine  1 mg Oral BID   enoxaparin (LOVENOX) injection  40 mg Subcutaneous Q24H   insulin aspart  0-9 Units Subcutaneous TID WC   lithium carbonate  150 mg Oral Q breakfast   lithium carbonate  300  mg Oral Q supper   metoprolol succinate  25 mg Oral Daily   mirabegron ER  50 mg Oral Daily   pravastatin  20 mg Oral QHS   sodium chloride flush  3 mL Intravenous Q12H   Continuous Infusions:  levETIRAcetam Stopped (07/02/22 0537)     LOS: 0 days   Time spent: 78mn  Marsella Suman C Tahani Potier, DO [facility]  If 7PM-7AM, please contact night-coverage [email or web address]  07/02/2022, 8:10 AM

## 2022-07-02 NOTE — Progress Notes (Signed)
Attempted to call pt's contact person listed on chart to complete admission assessment but was unable to reach. Pt has AMS and no family at bedside. Bedside RN aware.

## 2022-07-02 NOTE — ED Notes (Signed)
Patient transported to MRI 

## 2022-07-02 NOTE — Progress Notes (Signed)
Subjective: Improved this AM relative to yesterday. Lying in bed comfortably, but has removed her blanket and gown is not on.   Objective: Current vital signs: BP 121/87   Pulse 65   Temp 97.8 F (36.6 C) (Axillary)   Resp 14   SpO2 95%  Vital signs in last 24 hours: Temp:  [97.2 F (36.2 C)-98.5 F (36.9 C)] 97.8 F (36.6 C) (11/29 0519) Pulse Rate:  [65-93] 65 (11/29 0510) Resp:  [14-35] 14 (11/29 0510) BP: (107-145)/(74-94) 121/87 (11/29 0510) SpO2:  [88 %-100 %] 95 % (11/29 0510)  Intake/Output from previous day: 11/28 0701 - 11/29 0700 In: 93.2 [IV Piggyback:93.2] Out: -  Intake/Output this shift: No intake/output data recorded. Nutritional status:  Diet Order     None      HEENT: Old scar to left frontal scalp is noted Lungs: Respirations unlabored Ext: No edema  Neurologic Exam: Ment: Awake with decreased level of alertness. Answers simple questions given to her in both [place] and [place], but responds more consistently when spoken to with simple questions in basic [place]. Speech is sparse but fluent in the context of some perseverative one-word answers at times. Not oriented to the day of the week, the year or the month. When asked where she is, she repeats "EMS" perseveratively. Attention is fair. Naming intact; can identify a thumb, index finger and pinky in [place].  CN: EOMI with saccadic visual pursuits noted. Face symmetric. Tongue protrudes midline.  Motor: Elevates BUE and BLE antigravity without gross asymmetry.  Cerebellar: No gross ataxia noted.   Lab Results: Results for orders placed or performed during the hospital encounter of 07/01/22 (from the past 48 hour(s))  Blood Culture (Routine X 2)     Status: None (Preliminary result)   Collection Time: 07/01/22 12:21 PM   Specimen: BLOOD  Result Value Ref Range   Specimen Description BLOOD SITE NOT SPECIFIED    Special Requests      BOTTLES DRAWN AEROBIC AND ANAEROBIC Blood Culture results may not  be optimal due to an inadequate volume of blood received in culture bottles   Culture      NO GROWTH < 24 HOURS Performed at [facility] [address] [place], [place] [address]    Report Status PENDING   CBC with Differential/Platelet     Status: Abnormal   Collection Time: 07/01/22 12:25 PM  Result Value Ref Range   WBC 5.8 4.0 - 10.5 K/uL   RBC 4.56 3.87 - 5.11 MIL/uL   Hemoglobin 12.5 12.0 - 15.0 g/dL   HCT 41.3 36.0 - 46.0 %   MCV 90.6 80.0 - 100.0 fL   MCH 27.4 26.0 - 34.0 pg   MCHC 30.3 30.0 - 36.0 g/dL   RDW 12.8 11.5 - 15.5 %   Platelets 134 (L) 150 - 400 K/uL    Comment: REPEATED TO VERIFY   nRBC 0.0 0.0 - 0.2 %   Neutrophils Relative % 78 %   Neutro Abs 4.5 1.7 - 7.7 K/uL   Lymphocytes Relative 15 %   Lymphs Abs 0.8 0.7 - 4.0 K/uL   Monocytes Relative 5 %   Monocytes Absolute 0.3 0.1 - 1.0 K/uL   Eosinophils Relative 1 %   Eosinophils Absolute 0.1 0.0 - 0.5 K/uL   Basophils Relative 0 %   Basophils Absolute 0.0 0.0 - 0.1 K/uL   Immature Granulocytes 1 %   Abs Immature Granulocytes 0.04 0.00 - 0.07 K/uL    Comment: Performed  at [facility], [place] [address], [place], [place] [address]  Lactic acid, plasma     Status: Abnormal   Collection Time: 07/01/22 12:25 PM  Result Value Ref Range   Lactic Acid, Venous 5.4 (HH) 0.5 - 1.9 mmol/L    Comment: CRITICAL RESULT CALLED TO, READ BACK BY AND VERIFIED WITH Zerita Boers, RN 1353 07/01/22 L. KLAR Performed at [facility], [place] [address] [place], [place] [address]   Ethanol     Status: None   Collection Time: 07/01/22 12:25 PM  Result Value Ref Range   Alcohol, Ethyl (B) <10 <10 mg/dL    Comment: (NOTE) Lowest detectable limit for serum alcohol is 10 mg/dL.  For medical purposes only. Performed at [facility], [place] [address] [place], [place] [address]   TSH     Status: None   Collection Time: 07/01/22 12:25 PM  Result Value Ref Range   TSH 1.306 0.350 - 4.500 uIU/mL    Comment:  Performed by a 3rd Generation assay with a functional sensitivity of <=0.01 uIU/mL. Performed at [facility], [place] [address] [place], [place] [address]   Blood Culture (Routine X 2)     Status: Abnormal (Preliminary result)   Collection Time: 07/01/22 12:26 PM   Specimen: BLOOD  Result Value Ref Range   Specimen Description BLOOD SITE NOT SPECIFIED    Special Requests (A)     AEROCOCCUS SPECIES Blood Culture results may not be optimal due to an inadequate volume of blood received in culture bottles   Culture      NO GROWTH < 24 HOURS Performed at [facility] [address], [place], [place] [address]    Report Status PENDING   I-Stat venous blood gas, ED     Status: None   Collection Time: 07/01/22 12:37 PM  Result Value Ref Range   pH, Ven 7.303 7.25 - 7.43   pCO2, Ven 51.9 44 - 60 mmHg   pO2, Ven 41 32 - 45 mmHg   Bicarbonate 25.8 20.0 - 28.0 mmol/L   TCO2 27 22 - 32 mmol/L   O2 Saturation 70 %   Acid-base deficit 1.0 0.0 - 2.0 mmol/L   Sodium 140 135 - 145 mmol/L   Potassium 4.8 3.5 - 5.1 mmol/L   Calcium, Ion 1.27 1.15 - 1.40 mmol/L   HCT 39.0 36.0 - 46.0 %   Hemoglobin 13.3 12.0 - 15.0 g/dL   Sample type VENOUS   CBG monitoring, ED     Status: Abnormal   Collection Time: 07/01/22 12:40 PM  Result Value Ref Range   Glucose-Capillary 112 (H) 70 - 99 mg/dL    Comment: Glucose reference range applies only to samples taken after fasting for at least 8 hours.  Lithium level     Status: Abnormal   Collection Time: 07/01/22  1:00 PM  Result Value Ref Range   Lithium Lvl 0.54 (L) 0.60 - 1.20 mmol/L    Comment: Performed at [facility] [address], [place], [place] [address]  Ammonia     Status: Abnormal   Collection Time: 07/01/22  3:15 PM  Result Value Ref Range   Ammonia 48 (H) 9 - 35 umol/L    Comment: HEMOLYSIS AT THIS LEVEL MAY AFFECT RESULT Performed at [facility], [place] [address] [place], [place] [address]   Resp Panel by RT-PCR  (Flu A&B, Covid) Anterior Nasal Swab  Status: None   Collection Time: 07/01/22  3:15 PM   Specimen: Anterior Nasal Swab  Result Value Ref Range   SARS Coronavirus 2 by RT PCR NEGATIVE NEGATIVE    Comment: (NOTE) SARS-CoV-2 target nucleic acids are NOT DETECTED.  The SARS-CoV-2 RNA is generally detectable in upper respiratory specimens during the acute phase of infection. The lowest concentration of SARS-CoV-2 viral copies this assay can detect is 138 copies/mL. A negative result does not preclude SARS-Cov-2 infection and should not be used as the sole basis for treatment or other patient management decisions. A negative result may occur with  improper specimen collection/handling, submission of specimen other than nasopharyngeal swab, presence of viral mutation(s) within the areas targeted by this assay, and inadequate number of viral copies(<138 copies/mL). A negative result must be combined with clinical observations, patient history, and epidemiological information. The expected result is Negative.  Fact Sheet for Patients:  EntrepreneurPulse.com.au  Fact Sheet for Healthcare Providers:  IncredibleEmployment.be  This test is no t yet approved or cleared by the [place] FDA and  has been authorized for detection and/or diagnosis of SARS-CoV-2 by FDA under an Emergency Use Authorization (EUA). This EUA will remain  in effect (meaning this test can be used) for the duration of the COVID-19 declaration under Section 564(b)(1) of the Act, 21 U.S.C.section 360bbb-3(b)(1), unless the authorization is terminated  or revoked sooner.       Influenza A by PCR NEGATIVE NEGATIVE   Influenza B by PCR NEGATIVE NEGATIVE    Comment: (NOTE) The Xpert Xpress SARS-CoV-2/FLU/RSV plus assay is intended as an aid in the diagnosis of influenza from Nasopharyngeal swab specimens and should not be used as a sole basis for treatment. Nasal washings  and aspirates are unacceptable for Xpert Xpress SARS-CoV-2/FLU/RSV testing.  Fact Sheet for Patients: EntrepreneurPulse.com.au  Fact Sheet for Healthcare Providers: IncredibleEmployment.be  This test is not yet approved or cleared by the [place] FDA and has been authorized for detection and/or diagnosis of SARS-CoV-2 by FDA under an Emergency Use Authorization (EUA). This EUA will remain in effect (meaning this test can be used) for the duration of the COVID-19 declaration under Section 564(b)(1) of the Act, 21 U.S.C. section 360bbb-3(b)(1), unless the authorization is terminated or revoked.  Performed at [facility], [place] [address], [place], [place] [address]   CBG monitoring, ED     Status: Abnormal   Collection Time: 07/01/22  5:43 PM  Result Value Ref Range   Glucose-Capillary 137 (H) 70 - 99 mg/dL    Comment: Glucose reference range applies only to samples taken after fasting for at least 8 hours.  CBG monitoring, ED     Status: Abnormal   Collection Time: 07/01/22 11:45 PM  Result Value Ref Range   Glucose-Capillary 130 (H) 70 - 99 mg/dL    Comment: Glucose reference range applies only to samples taken after fasting for at least 8 hours.  Comprehensive metabolic panel     Status: Abnormal   Collection Time: 07/02/22  5:15 AM  Result Value Ref Range   Sodium 137 135 - 145 mmol/L   Potassium 4.6 3.5 - 5.1 mmol/L   Chloride 105 98 - 111 mmol/L   CO2 23 22 - 32 mmol/L   Glucose, Bld 117 (H) 70 - 99 mg/dL    Comment: Glucose reference range applies only to samples taken after fasting for at least 8 hours.   BUN 12 8 - 23 mg/dL   Creatinine, Ser  1.00 0.44 - 1.00 mg/dL   Calcium 9.7 8.9 - 10.3 mg/dL   Total Protein 6.5 6.5 - 8.1 g/dL   Albumin 3.7 3.5 - 5.0 g/dL   AST 19 15 - 41 U/L   ALT 21 0 - 44 U/L   Alkaline Phosphatase 89 38 - 126 U/L   Total Bilirubin 0.4 0.3 - 1.2 mg/dL   GFR, Estimated >60 >60 mL/min    Comment:  (NOTE) Calculated using the CKD-EPI Creatinine Equation (2021)    Anion gap 9 5 - 15    Comment: Performed at [facility] [address], [place] [address]  CBC     Status: None   Collection Time: 07/02/22  5:15 AM  Result Value Ref Range   WBC 9.2 4.0 - 10.5 K/uL   RBC 4.91 3.87 - 5.11 MIL/uL   Hemoglobin 13.5 12.0 - 15.0 g/dL   HCT 43.4 36.0 - 46.0 %   MCV 88.4 80.0 - 100.0 fL   MCH 27.5 26.0 - 34.0 pg   MCHC 31.1 30.0 - 36.0 g/dL   RDW 12.6 11.5 - 15.5 %   Platelets 194 150 - 400 K/uL   nRBC 0.0 0.0 - 0.2 %    Comment: Performed at [facility], [place] [address] [place], [place] [address]    Recent Results (from the past 240 hour(s))  Blood Culture (Routine X 2)     Status: None (Preliminary result)   Collection Time: 07/01/22 12:21 PM   Specimen: BLOOD  Result Value Ref Range Status   Specimen Description BLOOD SITE NOT SPECIFIED  Final   Special Requests   Final    BOTTLES DRAWN AEROBIC AND ANAEROBIC Blood Culture results may not be optimal due to an inadequate volume of blood received in culture bottles   Culture   Final    NO GROWTH < 24 HOURS Performed at [facility], [place] [address], [place], [place] [address]    Report Status PENDING  Incomplete  Blood Culture (Routine X 2)     Status: Abnormal (Preliminary result)   Collection Time: 07/01/22 12:26 PM   Specimen: BLOOD  Result Value Ref Range Status   Specimen Description BLOOD SITE NOT SPECIFIED  Final   Special Requests (A)  Final    AEROCOCCUS SPECIES Blood Culture results may not be optimal due to an inadequate volume of blood received in culture bottles   Culture   Final    NO GROWTH < 24 HOURS Performed at [facility], [place] [address] [place], [place] [address]    Report Status PENDING  Incomplete  Resp Panel by RT-PCR (Flu A&B, Covid) Anterior Nasal Swab     Status: None   Collection Time: 07/01/22  3:15 PM   Specimen: Anterior Nasal Swab  Result Value Ref Range  Status   SARS Coronavirus 2 by RT PCR NEGATIVE NEGATIVE Final    Comment: (NOTE) SARS-CoV-2 target nucleic acids are NOT DETECTED.  The SARS-CoV-2 RNA is generally detectable in upper respiratory specimens during the acute phase of infection. The lowest concentration of SARS-CoV-2 viral copies this assay can detect is 138 copies/mL. A negative result does not preclude SARS-Cov-2 infection and should not be used as the sole basis for treatment or other patient management decisions. A negative result may occur with  improper specimen collection/handling, submission of specimen other than nasopharyngeal swab, presence of viral mutation(s) within the areas targeted by this assay, and inadequate number  of viral copies(<138 copies/mL). A negative result must be combined with clinical observations, patient history, and epidemiological information. The expected result is Negative.  Fact Sheet for Patients:  EntrepreneurPulse.com.au  Fact Sheet for Healthcare Providers:  IncredibleEmployment.be  This test is no t yet approved or cleared by the [place] FDA and  has been authorized for detection and/or diagnosis of SARS-CoV-2 by FDA under an Emergency Use Authorization (EUA). This EUA will remain  in effect (meaning this test can be used) for the duration of the COVID-19 declaration under Section 564(b)(1) of the Act, 21 U.S.C.section 360bbb-3(b)(1), unless the authorization is terminated  or revoked sooner.       Influenza A by PCR NEGATIVE NEGATIVE Final   Influenza B by PCR NEGATIVE NEGATIVE Final    Comment: (NOTE) The Xpert Xpress SARS-CoV-2/FLU/RSV plus assay is intended as an aid in the diagnosis of influenza from Nasopharyngeal swab specimens and should not be used as a sole basis for treatment. Nasal washings and aspirates are unacceptable for Xpert Xpress SARS-CoV-2/FLU/RSV testing.  Fact Sheet for  Patients: EntrepreneurPulse.com.au  Fact Sheet for Healthcare Providers: IncredibleEmployment.be  This test is not yet approved or cleared by the [place] FDA and has been authorized for detection and/or diagnosis of SARS-CoV-2 by FDA under an Emergency Use Authorization (EUA). This EUA will remain in effect (meaning this test can be used) for the duration of the COVID-19 declaration under Section 564(b)(1) of the Act, 21 U.S.C. section 360bbb-3(b)(1), unless the authorization is terminated or revoked.  Performed at [facility], [place] [address], [place],  [address]     Lipid Panel No results for input(s): "CHOL", "TRIG", "HDL", "CHOLHDL", "VLDL", "LDLCALC" in the last 72 hours.  Studies/Results: EEG adult  Result Date: 07/01/2022 Lora Havens, MD     07/01/2022  8:58 PM Patient Name: Jenna Powell MRN: [id number] Epilepsy Attending: Lora Havens Referring Physician/Provider: Regan Lemming, MD Date: 07/01/2022 Duration: 24.16 mins Patient history: 71 y.o. female with DM, bipolar disorder, schizophrenia presented to the hospital after an episode of syncope. EEG to evaluate for seizure. Level of alertness: Awake, asleep AEDs during EEG study: LEV, Ativan Technical aspects: This EEG study was done with scalp electrodes positioned according to the 10-20 International system of electrode placement. Electrical activity was reviewed with band pass filter of 1-'70Hz'$ , sensitivity of 7 uV/mm, display speed of 51m/sec with a '60Hz'$  notched filter applied as appropriate. EEG data were recorded continuously and digitally stored.  Video monitoring was available and reviewed as appropriate. Description: No clear posterior dominant rhythm was seen. Sleep was characterized by sleep spindles (12 to 14 Hz), maximal frontocentral region.  EEG showed continuous generalized 3 to 7 Hz theta-delta slowing. Hyperventilation and photic stimulation were  not performed.   ABNORMALITY - Continuous slow, generalized IMPRESSION: This study is suggestive of moderate diffuse encephalopathy, nonspecific etiology. No seizures or epileptiform discharges were seen throughout the recording. Priyanka OBarbra Sarks  CT HEAD WO CONTRAST  Result Date: 07/01/2022 CLINICAL DATA:  Mental status change of unknown cause. EXAM: CT HEAD WITHOUT CONTRAST TECHNIQUE: Contiguous axial images were obtained from the base of the skull through the vertex without intravenous contrast. RADIATION DOSE REDUCTION: This exam was performed according to the departmental dose-optimization program which includes automated exposure control, adjustment of the mA and/or kV according to patient size and/or use of iterative reconstruction technique. COMPARISON:  01/26/2020 FINDINGS: Brain: No abnormality affects the brainstem or cerebellum. The right cerebral hemisphere is  normal for age. On the left, there is vasogenic edema in the parietal vertex region. There is a slightly hyperdense extra-axial mass lesion measuring 3 x 3.5 cm in diameter consistent with meningioma. This can be seen as a very subtle finding in 2021, not prospectively visible. This has enlarged. There is a broad surface along the falx. I cannot make any statement regarding superior sagittal sinus invasion but that is possible. MRI with contrast would be recommended for further evaluation. No hydrocephalus. No hemorrhage. No extra-axial fluid collection. Vascular: No abnormal vascular finding. Skull: Negative Sinuses/Orbits: Clear/normal Other: None IMPRESSION: 3 x 3.5 cm extra-axial mass lesion in the left parietal vertex region with vasogenic edema within the left parietal lobe. This can be seen as a very subtle finding in 2021, smaller and not prospectively visible. This is consistent with a meningioma. I cannot make any statement regarding superior sagittal sinus invasion but that is possible. MRI with contrast would be recommended for  further evaluation. Electronically Signed   By: Nelson Chimes M.D.   On: 07/01/2022 13:53   DG Chest Port 1 View  Result Date: 07/01/2022 CLINICAL DATA:  Altered mental status. EXAM: PORTABLE CHEST 1 VIEW COMPARISON:  Chest x-ray dated January 26, 2020. FINDINGS: Chronic cardiomegaly. Normal pulmonary vascularity. Chronic low lung volumes with bibasilar atelectasis. Chronic elevation of the left hemidiaphragm. No pneumothorax or large pleural effusion. No acute osseous abnormality. IMPRESSION: 1. Chronic low lung volumes with bibasilar atelectasis. Electronically Signed   By: Titus Dubin M.D.   On: 07/01/2022 12:40    Medications: Scheduled:  benztropine  1 mg Oral BID   enoxaparin (LOVENOX) injection  40 mg Subcutaneous Q24H   insulin aspart  0-9 Units Subcutaneous TID WC   lithium carbonate  150 mg Oral Q breakfast   lithium carbonate  300 mg Oral Q supper   metoprolol succinate  25 mg Oral Daily   mirabegron ER  50 mg Oral Daily   pravastatin  20 mg Oral QHS   sodium chloride flush  3 mL Intravenous Q12H   Continuous:  levETIRAcetam Stopped (07/02/22 0537)    Assessment: 71 year old female with a PMHx of dementia, schizophrenia, DM, HLD and HTN who was BIB EMS after being found unresponsive at [facility]. LKN 1020. She vomited x 3 with EMS and was noted to have pinpoint pupils; respirations were normal. GCS 12 on arrival to the [facility] ED. Given labs with elevated lactate > 5, an unwitnessed seizure was considered to be relatively high on the DDx. 1000 mg IV Keppra was given. She was also administered 10 mg IV Decadron. EEG was ordered and Neurology was consulted to further evaluate.   - Exam improved this AM but still disoriented and with impaired attention and situational awareness. She perseverates verbally at times. No jerking, twitching or other clinical seizure-like activity noted.  - CT head: 3 x 3.5 cm extra-axial mass lesion in the left parietal vertex region with  vasogenic edema within the left parietal lobe. This can be seen as a very subtle finding in 2021, smaller and not prospectively visible. This is consistent with a meningioma. Radiology cannot make any statement regarding superior sagittal sinus invasion but that is possible - EEG:  Continuous slow, generalized. This study is suggestive of moderate diffuse encephalopathy, nonspecific to etiology. No seizures or epileptiform discharges were seen throughout the recording. - Labs: - Ammonia elevated at 48 - Lactate elevated at 5.4 on labs obtained shortly after arrival - No leukocytosis  on CBC - EtOH < 10 - Lithium level not toxic, but slightly below therapeutic (0.54)     Recommendations: - Neurosurgery is following the patient.  - Agree with Neurosurgery recommendation to obtain MRI brain with and without contrast and MRV for further characterization, which have been ordered but are still pending.  - Creatinine level is normal.  - Continue Keppra at 500 mg IV BID - Neurohospitalist service will follow PRN. Please call if there are any additional questions.    LOS: 0 days   '@Electronically'$  signed: Dr. Kerney Elbe 07/02/2022  8:30 AM

## 2022-07-03 ENCOUNTER — Inpatient Hospital Stay (HOSPITAL_COMMUNITY): Payer: Medicare Other

## 2022-07-03 ENCOUNTER — Encounter (HOSPITAL_COMMUNITY): Payer: Self-pay | Admitting: Internal Medicine

## 2022-07-03 ENCOUNTER — Other Ambulatory Visit: Payer: Self-pay

## 2022-07-03 DIAGNOSIS — G9340 Encephalopathy, unspecified: Secondary | ICD-10-CM | POA: Diagnosis not present

## 2022-07-03 DIAGNOSIS — D329 Benign neoplasm of meninges, unspecified: Secondary | ICD-10-CM | POA: Diagnosis not present

## 2022-07-03 DIAGNOSIS — R4189 Other symptoms and signs involving cognitive functions and awareness: Secondary | ICD-10-CM | POA: Diagnosis not present

## 2022-07-03 LAB — GLUCOSE, CAPILLARY
Glucose-Capillary: 83 mg/dL (ref 70–99)
Glucose-Capillary: 88 mg/dL (ref 70–99)
Glucose-Capillary: 94 mg/dL (ref 70–99)
Glucose-Capillary: 111 mg/dL — ABNORMAL HIGH (ref 70–99)

## 2022-07-03 MED ORDER — SODIUM CHLORIDE 0.9 % IV SOLN: INTRAVENOUS | Status: DC | PRN

## 2022-07-03 MED ORDER — GADOBUTROL 1 MMOL/ML IV SOLN
10.0000 mL | Freq: Once | INTRAVENOUS | Status: AC | PRN
  Administered 2022-07-03: 10 mL via INTRAVENOUS

## 2022-07-03 NOTE — Progress Notes (Signed)
  Transition of Care [facility]) Screening Note   Patient Details  Name: Jenna Powell Date of Birth: [birth year]   Transition [facility] Care [facility]) CM/SW Contact:    Pollie Friar, RN Phone Number: 07/03/2022, 1:34 PM   Pt is from [place]. Awaiting work up on mass.  Transition of Care Department [facility]) has reviewed patient. We will continue to monitor patient advancement through interdisciplinary progression rounds. If new patient transition needs arise, please place a TOC consult.

## 2022-07-03 NOTE — Plan of Care (Signed)

## 2022-07-03 NOTE — Progress Notes (Signed)
PROGRESS NOTE    Jenna Powell  GBT:[id number] DOB: 09-29-1950 DOA: 07/01/2022 PCP: Merryl Hacker, No   Brief Narrative:  Jenna Powell is a 71 y.o. female with medical history significant of bipolar disorder, dementia, hypertension, diabetes who was found down at her facility.  Initial history obtained from facility and chart review, patient's mental status appears to be limited at baseline.  Patient episodes of vomiting with pinpoint pupils per facility found to be minimally responsive.  In the ED patient's mental status continued to wax and wane minimally improving, admitted for seizure evaluation by hospitalist team, neurology consulting.  Neurosurgery consulted due to mass as noted on CT.  Assessment & Plan:   Principal Problem:   Acute encephalopathy Active Problems:   Dementia without behavioral disturbance (HCC)   Bipolar disorder (HCC)   DM (diabetes mellitus), type 2 (HCC)   Essential hypertension   Meningioma (HCC)   Vasogenic edema (HCC)   Seizure ([place])   Questionable acute onset seizure Acute encephalopathy, improving Rule out polypharmacy -Found down at facility with emesis and pinpoint pupils, last known well 10 AM the day of admission -Initial concern for polypharmacy on multiple sedating medications - these have been held as below -CT head shows 3 x 3.5 cm mass concerning for meningioma with questionable vasogenic edema, similar in appearance to 2021 imaging if not somewhat larger -MRI ordered, still awaiting imaging; not done overnight -Neurology and neurosurgery consulted, appreciate insight and recommendations -Initiated Keppra per neurology - Hold clozapine, donepezil, risperidone, sertraline, trazodone, lithium, Cogentin given concern for polypharmacy   Meningioma -CT head with 3 x 3.5 cm mass consistent with meningioma which was increased from subtle findings in 2021.   -Neurosurgery consulted, agree with EEG and MRI -MRI still not performed despite order for  IV Ativan for sedation as needed but patient more coherent and reasonable/interactive today   Bipolar disorder Dementia - As above, holding home clozapine, donepezil, lithium, risperidone, sertraline, trazodone, Cogentin   Hypertension - Continue metoprolol   Diabetes - SSI   DVT prophylaxis:      Lovenox Code Status:              Full Family Communication: Son updated by phone previously  Status is: Inpatient  Dispo: The patient is from: Facility              Anticipated d/c is to: Same              Anticipated d/c date is: 48 to 72 hours pending clinical course              Patient currently not medically stable for discharge  Consultants:  Neurosurgery, neurology  Procedures:  None yet  Antimicrobials:  Not indicated  Subjective: No acute issues or events overnight, patient's mental status continues to wax and wane but moderately improved. MRI still not completed - was told 'patient couldn't tolerate it' per night sign out but unclear if it was attempted.given lack of documentation.  Objective: Vitals:   07/02/22 2349 07/03/22 0311 07/03/22 0344 07/03/22 0726  BP: 105/69 105/69 126/62 130/68  Pulse: 68 68 64 66  Resp: '17 17  18  '$ Temp: 98.6 F (37 C) 98.6 F (37 C) 98.1 F (36.7 C) 97.9 F (36.6 C)  TempSrc:  Oral  Oral  SpO2: 100%  100% 99%  Height:  '5\' 1"'$  (1.549 m)      Intake/Output Summary (Last 24 hours) at 07/03/2022 0738 Last data filed at 07/03/2022 0401 Gross per  24 hour  Intake 776.36 ml  Output 150 ml  Net 626.36 ml    There were no vitals filed for this visit.  Examination:  General:  Pleasantly resting in bed, No acute distress. HEENT:  Normocephalic atraumatic.  Sclerae nonicteric, noninjected.  Extraocular movements intact bilaterally. Neck:  Without mass or deformity.  Trachea is midline. Lungs:  Clear to auscultate bilaterally without rhonchi, wheeze, or rales. Heart:  Regular rate and rhythm.  Without murmurs, rubs, or  gallops. Abdomen:  Soft, nontender, nondistended.  Without guarding or rebound. Extremities: Without cyanosis, clubbing, edema, or obvious deformity. Vascular:  Dorsalis pedis and posterior tibial pulses palpable bilaterally. Skin:  Warm and dry, no erythema, no ulcerations.   Data Reviewed: I have personally reviewed following labs and imaging studies  CBC: Recent Labs  Lab 07/01/22 1225 07/01/22 1237 07/02/22 0515  WBC 5.8  --  9.2  NEUTROABS 4.5  --   --   HGB 12.5 13.3 13.5  HCT 41.3 39.0 43.4  MCV 90.6  --  88.4  PLT 134*  --  993    Basic Metabolic Panel: Recent Labs  Lab 07/01/22 1237 07/02/22 0515  NA 140 137  K 4.8 4.6  CL  --  105  CO2  --  23  GLUCOSE  --  117*  BUN  --  12  CREATININE  --  1.00  CALCIUM  --  9.7    GFR: CrCl cannot be calculated (Unknown ideal weight.). Liver Function Tests: Recent Labs  Lab 07/02/22 0515  AST 19  ALT 21  ALKPHOS 89  BILITOT 0.4  PROT 6.5  ALBUMIN 3.7    No results for input(s): "LIPASE", "AMYLASE" in the last 168 hours. Recent Labs  Lab 07/01/22 1515  AMMONIA 48*    Coagulation Profile: No results for input(s): "INR", "PROTIME" in the last 168 hours. Cardiac Enzymes: No results for input(s): "CKTOTAL", "CKMB", "CKMBINDEX", "TROPONINI" in the last 168 hours. BNP (last 3 results) No results for input(s): "PROBNP" in the last 8760 hours. HbA1C: No results for input(s): "HGBA1C" in the last 72 hours. CBG: Recent Labs  Lab 07/02/22 0930 07/02/22 1217 07/02/22 1754 07/02/22 2132 07/03/22 0612  GLUCAP 91 81 115* 152* 83    Lipid Profile: No results for input(s): "CHOL", "HDL", "LDLCALC", "TRIG", "CHOLHDL", "LDLDIRECT" in the last 72 hours. Thyroid Function Tests: Recent Labs    07/01/22 1225  TSH 1.306    Anemia Panel: No results for input(s): "VITAMINB12", "FOLATE", "FERRITIN", "TIBC", "IRON", "RETICCTPCT" in the last 72 hours. Sepsis Labs: Recent Labs  Lab 07/01/22 1225  LATICACIDVEN  5.4*     Recent Results (from the past 240 hour(s))  Blood Culture (Routine X 2)     Status: None (Preliminary result)   Collection Time: 07/01/22 12:21 PM   Specimen: BLOOD  Result Value Ref Range Status   Specimen Description BLOOD SITE NOT SPECIFIED  Final   Special Requests   Final    BOTTLES DRAWN AEROBIC AND ANAEROBIC Blood Culture results may not be optimal due to an inadequate volume of blood received in culture bottles   Culture   Final    NO GROWTH 2 DAYS Performed at [facility], [place] [address] [place], [place] [address]    Report Status PENDING  Incomplete  Blood Culture (Routine X 2)     Status: Abnormal (Preliminary result)   Collection Time: 07/01/22 12:26 PM   Specimen: BLOOD  Result Value Ref Range Status  Specimen Description BLOOD SITE NOT SPECIFIED  Final   Special Requests (A)  Final    AEROCOCCUS SPECIES Blood Culture results may not be optimal due to an inadequate volume of blood received in culture bottles   Culture   Final    NO GROWTH 2 DAYS Performed at [facility] [address], [place], [place] [address]    Report Status PENDING  Incomplete  Resp Panel by RT-PCR (Flu A&B, Covid) Anterior Nasal Swab     Status: None   Collection Time: 07/01/22  3:15 PM   Specimen: Anterior Nasal Swab  Result Value Ref Range Status   SARS Coronavirus 2 by RT PCR NEGATIVE NEGATIVE Final    Comment: (NOTE) SARS-CoV-2 target nucleic acids are NOT DETECTED.  The SARS-CoV-2 RNA is generally detectable in upper respiratory specimens during the acute phase of infection. The lowest concentration of SARS-CoV-2 viral copies this assay can detect is 138 copies/mL. A negative result does not preclude SARS-Cov-2 infection and should not be used as the sole basis for treatment or other patient management decisions. A negative result may occur with  improper specimen collection/handling, submission of specimen other than nasopharyngeal swab, presence of viral  mutation(s) within the areas targeted by this assay, and inadequate number of viral copies(<138 copies/mL). A negative result must be combined with clinical observations, patient history, and epidemiological information. The expected result is Negative.  Fact Sheet for Patients:  EntrepreneurPulse.com.au  Fact Sheet for Healthcare Providers:  IncredibleEmployment.be  This test is no t yet approved or cleared by the [place] FDA and  has been authorized for detection and/or diagnosis of SARS-CoV-2 by FDA under an Emergency Use Authorization (EUA). This EUA will remain  in effect (meaning this test can be used) for the duration of the COVID-19 declaration under Section 564(b)(1) of the Act, 21 U.S.C.section 360bbb-3(b)(1), unless the authorization is terminated  or revoked sooner.       Influenza A by PCR NEGATIVE NEGATIVE Final   Influenza B by PCR NEGATIVE NEGATIVE Final    Comment: (NOTE) The Xpert Xpress SARS-CoV-2/FLU/RSV plus assay is intended as an aid in the diagnosis of influenza from Nasopharyngeal swab specimens and should not be used as a sole basis for treatment. Nasal washings and aspirates are unacceptable for Xpert Xpress SARS-CoV-2/FLU/RSV testing.  Fact Sheet for Patients: EntrepreneurPulse.com.au  Fact Sheet for Healthcare Providers: IncredibleEmployment.be  This test is not yet approved or cleared by the [place] FDA and has been authorized for detection and/or diagnosis of SARS-CoV-2 by FDA under an Emergency Use Authorization (EUA). This EUA will remain in effect (meaning this test can be used) for the duration of the COVID-19 declaration under Section 564(b)(1) of the Act, 21 U.S.C. section 360bbb-3(b)(1), unless the authorization is terminated or revoked.  Performed at [facility], [place] [address] [place], [place] [address]          Radiology Studies: EEG  adult  Result Date: 07/01/2022 Lora Havens, MD     07/01/2022  8:58 PM Patient Name: Jenna Powell MRN: [id number] Epilepsy Attending: Lora Havens Referring Physician/Provider: Regan Lemming, MD Date: 07/01/2022 Duration: 24.16 mins Patient history: 71 y.o. female with DM, bipolar disorder, schizophrenia presented to the hospital after an episode of syncope. EEG to evaluate for seizure. Level of alertness: Awake, asleep AEDs during EEG study: LEV, Ativan Technical aspects: This EEG study was done with scalp electrodes positioned according to the 10-20 International system of electrode placement.  Electrical activity was reviewed with band pass filter of 1-'70Hz'$ , sensitivity of 7 uV/mm, display speed of 63m/sec with a '60Hz'$  notched filter applied as appropriate. EEG data were recorded continuously and digitally stored.  Video monitoring was available and reviewed as appropriate. Description: No clear posterior dominant rhythm was seen. Sleep was characterized by sleep spindles (12 to 14 Hz), maximal frontocentral region.  EEG showed continuous generalized 3 to 7 Hz theta-delta slowing. Hyperventilation and photic stimulation were not performed.   ABNORMALITY - Continuous slow, generalized IMPRESSION: This study is suggestive of moderate diffuse encephalopathy, nonspecific etiology. No seizures or epileptiform discharges were seen throughout the recording. Priyanka OBarbra Sarks  CT HEAD WO CONTRAST  Result Date: 07/01/2022 CLINICAL DATA:  Mental status change of unknown cause. EXAM: CT HEAD WITHOUT CONTRAST TECHNIQUE: Contiguous axial images were obtained from the base of the skull through the vertex without intravenous contrast. RADIATION DOSE REDUCTION: This exam was performed according to the departmental dose-optimization program which includes automated exposure control, adjustment of the mA and/or kV according to patient size and/or use of iterative reconstruction technique. COMPARISON:   01/26/2020 FINDINGS: Brain: No abnormality affects the brainstem or cerebellum. The right cerebral hemisphere is normal for age. On the left, there is vasogenic edema in the parietal vertex region. There is a slightly hyperdense extra-axial mass lesion measuring 3 x 3.5 cm in diameter consistent with meningioma. This can be seen as a very subtle finding in 2021, not prospectively visible. This has enlarged. There is a broad surface along the falx. I cannot make any statement regarding superior sagittal sinus invasion but that is possible. MRI with contrast would be recommended for further evaluation. No hydrocephalus. No hemorrhage. No extra-axial fluid collection. Vascular: No abnormal vascular finding. Skull: Negative Sinuses/Orbits: Clear/normal Other: None IMPRESSION: 3 x 3.5 cm extra-axial mass lesion in the left parietal vertex region with vasogenic edema within the left parietal lobe. This can be seen as a very subtle finding in 2021, smaller and not prospectively visible. This is consistent with a meningioma. I cannot make any statement regarding superior sagittal sinus invasion but that is possible. MRI with contrast would be recommended for further evaluation. Electronically Signed   By: MNelson ChimesM.D.   On: 07/01/2022 13:53   DG Chest Port 1 View  Result Date: 07/01/2022 CLINICAL DATA:  Altered mental status. EXAM: PORTABLE CHEST 1 VIEW COMPARISON:  Chest x-ray dated January 26, 2020. FINDINGS: Chronic cardiomegaly. Normal pulmonary vascularity. Chronic low lung volumes with bibasilar atelectasis. Chronic elevation of the left hemidiaphragm. No pneumothorax or large pleural effusion. No acute osseous abnormality. IMPRESSION: 1. Chronic low lung volumes with bibasilar atelectasis. Electronically Signed   By: WTitus DubinM.D.   On: 07/01/2022 12:40     Scheduled Meds:  benztropine  1 mg Oral BID   enoxaparin (LOVENOX) injection  40 mg Subcutaneous Q24H   insulin aspart  0-9 Units Subcutaneous  TID WC   lithium carbonate  150 mg Oral Q breakfast   lithium carbonate  300 mg Oral Q supper   metoprolol succinate  25 mg Oral Daily   mirabegron ER  50 mg Oral Daily   pravastatin  20 mg Oral QHS   sodium chloride flush  3 mL Intravenous Q12H   Continuous Infusions:  sodium chloride 10 mL/hr at 07/03/22 0546   levETIRAcetam 500 mg (07/03/22 0547)     LOS: 1 day   Time spent: 454m  Tyrah Broers C Aulton Routt, DO [facility]  If  7PM-7AM, please contact night-coverage [email or web address]  07/03/2022, 7:38 AM

## 2022-07-04 ENCOUNTER — Other Ambulatory Visit: Payer: Self-pay | Admitting: Neurosurgery

## 2022-07-04 DIAGNOSIS — D329 Benign neoplasm of meninges, unspecified: Secondary | ICD-10-CM | POA: Diagnosis not present

## 2022-07-04 DIAGNOSIS — R4189 Other symptoms and signs involving cognitive functions and awareness: Secondary | ICD-10-CM | POA: Diagnosis not present

## 2022-07-04 DIAGNOSIS — G9340 Encephalopathy, unspecified: Secondary | ICD-10-CM | POA: Diagnosis not present

## 2022-07-04 LAB — GLUCOSE, CAPILLARY
Glucose-Capillary: 105 mg/dL — ABNORMAL HIGH (ref 70–99)
Glucose-Capillary: 117 mg/dL — ABNORMAL HIGH (ref 70–99)
Glucose-Capillary: 137 mg/dL — ABNORMAL HIGH (ref 70–99)
Glucose-Capillary: 111 mg/dL — ABNORMAL HIGH (ref 70–99)

## 2022-07-04 NOTE — Progress Notes (Signed)
Subjective: Patient reports no headache.  Objective: Vital signs in last 24 hours: Temp:  [99 F (37.2 C)-100.3 F (37.9 C)] 99.1 F (37.3 C) (12/01 1615) Pulse Rate:  [75-93] 75 (12/01 1615) Resp:  [18-20] 18 (12/01 1615) BP: (120-147)/(71-86) 134/71 (12/01 1615) SpO2:  [96 %-100 %] 96 % (12/01 1615)  Intake/Output from previous day: 11/30 0701 - 12/01 0700 In: 1203.7 [P.O.:820; I.V.:83.6; IV Piggyback:300.1] Out: 1150 [Urine:1150] Intake/Output this shift: Total I/O In: 360 [P.O.:360] Out: 200 [Urine:200]  Awake, drowsy, oriented to person, hospital FC x 4, no obvious drift  Lab Results: Recent Labs    07/02/22 0515  WBC 9.2  HGB 13.5  HCT 43.4  PLT 194   BMET Recent Labs    07/02/22 0515  NA 137  K 4.6  CL 105  CO2 23  GLUCOSE 117*  BUN 12  CREATININE 1.00  CALCIUM 9.7    Studies/Results: MR Brain W and Wo Contrast  Result Date: 07/03/2022 CLINICAL DATA:  Meningioma include Stealth or Brainlab protocol; Headache, no red flags. EXAM: MRI HEAD WITHOUT AND WITH CONTRAST MR VENOGRAM HEAD WITHOUT AND WITH CONTRAST TECHNIQUE: Multiplanar, multi-echo pulse sequences of the brain and surrounding structures were acquired without and with intravenous contrast. Angiographic images of the intracranial venous structures were acquired using MRV technique without and with intravenous contrast. CONTRAST:  36m GADAVIST GADOBUTROL 1 MMOL/ML IV SOLN COMPARISON:  Head CT 07/01/2022 FINDINGS: MRI HEAD WITHOUT AND WITH CONTRAST The study is mildly motion degraded. Brain: A stealth protocol was performed for surgical planning. This does not include all routine brain MRI sequences, and the lack of diffusion weighted imaging specifically limits assessment for acute infarct. An avidly, homogeneously enhancing extra-axial mass along the left aspect of the posterior falx in the parietal region measures 2.3 x 3.1 x 4.2 cm with an associated dural tail. There is local mass effect on the  medial left parietal lobe with associated mild vasogenic edema in the left parietal white matter. The superior sagittal sinus is compressed but not frankly invaded by the mass. No other enhancing intracranial lesion is present. There is no midline shift or extra-axial fluid collection. Minimal scattered T2 hyperintensities in the cerebral white matter bilaterally are within normal limits for age. The ventricles are normal in size. There is a partially empty sella. Vascular: Compressed but grossly preserved superior sagittal sinus flow void on the FLAIR sequence. Skull and upper cervical spine: Unremarkable bone marrow signal. Sinuses/Orbits: No acute finding. Other: None. MR VENOGRAM HEAD WITHOUT AND WITH CONTRAST There is no evidence of dural venous sinus or deep cerebral vein thrombosis. The posterior aspect of the superior sagittal sinus is compressed by the mass but remains patent. 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 Compression of the adjacent superior sagittal sinus which remains patent without gross invasion. Electronically Signed   By: ALogan BoresM.D.   On: 07/03/2022 15:26   MR MRV HEAD W WO CONTRAST  Result Date: 07/03/2022 CLINICAL DATA:  Meningioma include Stealth or Brainlab protocol; Headache, no red flags. EXAM: MRI HEAD WITHOUT AND WITH CONTRAST MR VENOGRAM HEAD WITHOUT AND WITH CONTRAST TECHNIQUE: Multiplanar, multi-echo pulse sequences of the brain and surrounding structures were acquired without and with intravenous contrast. Angiographic images of the intracranial venous structures were acquired using MRV technique without and with intravenous contrast. CONTRAST:  159mGADAVIST GADOBUTROL 1 MMOL/ML IV SOLN COMPARISON:  Head CT 07/01/2022 FINDINGS: MRI HEAD WITHOUT AND WITH  CONTRAST The study is mildly motion degraded. Brain: A stealth protocol was performed for surgical planning. This does not include all routine  brain MRI sequences, and the lack of diffusion weighted imaging specifically limits assessment for acute infarct. An avidly, homogeneously enhancing extra-axial mass along the left aspect of the posterior falx in the parietal region measures 2.3 x 3.1 x 4.2 cm with an associated dural tail. There is local mass effect on the medial left parietal lobe with associated mild vasogenic edema in the left parietal white matter. The superior sagittal sinus is compressed but not frankly invaded by the mass. No other enhancing intracranial lesion is present. There is no midline shift or extra-axial fluid collection. Minimal scattered T2 hyperintensities in the cerebral white matter bilaterally are within normal limits for age. The ventricles are normal in size. There is a partially empty sella. Vascular: Compressed but grossly preserved superior sagittal sinus flow void on the FLAIR sequence. Skull and upper cervical spine: Unremarkable bone marrow signal. Sinuses/Orbits: No acute finding. Other: None. MR VENOGRAM HEAD WITHOUT AND WITH CONTRAST There is no evidence of dural venous sinus or deep cerebral vein thrombosis. The posterior aspect of the superior sagittal sinus is compressed by the mass but remains patent. 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 Compression of the adjacent superior sagittal sinus which remains patent without gross invasion. Electronically Signed   By: Logan Bores M.D.   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  - I had a long discussion with the patient's daughter in law.  Given the rapid growth of the meningioma over the past 2 years  and the cortical irritation is causing, I would recommend  surgical resection.  There is abutment but no obvious invasion of the sagittal sinus.   The growth pattern  increases the probability this may be an atypical meningioma.  I  discussed with the family the general technique of surgery, as well as risks, benefits, turns, and expected convalescence.  Risks discussed included, but were not limited to, bleeding, pain, infection, scar, seizure, stroke, neurologic deficit, Gerstmann syndrome, visual deficit, and death.  They likely will proceed with surgery.  He will tentatively schedule surgery for next Tuesday.  All questions and concerns were answered.  Vallarie Mare 07/04/2022, 5:50 PM

## 2022-07-04 NOTE — Care Management Important Message (Signed)
Important Message  Patient Details  Name: Jenna Powell MRN: [id number] Date of Birth: 09-26-1950   Medicare Important Message Given:  Yes     Orbie Pyo 07/04/2022, 2:07 PM

## 2022-07-04 NOTE — Plan of Care (Signed)
Problem: Education: Goal: Ability to describe self-care measures that may prevent or decrease complications (Diabetes Survival Skills Education) will improve Outcome: Progressing Goal: Individualized Educational Video(s) Outcome: Progressing   Problem: Coping: Goal: Ability to adjust to condition or change in health will improve Outcome: Progressing   Problem: Fluid Volume: Goal: Ability to maintain a balanced intake and output will improve Outcome: Progressing   Problem: Health Behavior/Discharge Planning: Goal: Ability to identify and utilize available resources and services will improve Outcome: Progressing Goal: Ability to manage health-related needs will improve Outcome: Progressing   Problem: Metabolic: Goal: Ability to maintain appropriate glucose levels will improve Outcome: Progressing   Problem: Nutritional: Goal: Maintenance of adequate nutrition will improve Outcome: Progressing Goal: Progress toward achieving an optimal weight will improve Outcome: Progressing   Problem: Skin Integrity: Goal: Risk for impaired skin integrity will decrease Outcome: Progressing   Problem: Tissue Perfusion: Goal: Adequacy of tissue perfusion will improve Outcome: Progressing   Problem: Education: Goal: Knowledge of General Education information will improve Description: Including pain rating scale, medication(s)/side effects and non-pharmacologic comfort measures Outcome: Progressing   Problem: Health Behavior/Discharge Planning: Goal: Ability to manage health-related needs will improve Outcome: Progressing   Problem: Clinical Measurements: Goal: Ability to maintain clinical measurements within normal limits will improve Outcome: Progressing Goal: Will remain free from infection Outcome: Progressing Goal: Diagnostic test results will improve Outcome: Progressing Goal: Respiratory complications will improve Outcome: Progressing Goal: Cardiovascular complication will  be avoided Outcome: Progressing   Problem: Activity: Goal: Risk for activity intolerance will decrease Outcome: Progressing   Problem: Nutrition: Goal: Adequate nutrition will be maintained Outcome: Progressing   Problem: Coping: Goal: Level of anxiety will decrease Outcome: Progressing   Problem: Elimination: Goal: Will not experience complications related to bowel motility Outcome: Progressing Goal: Will not experience complications related to urinary retention Outcome: Progressing   Problem: Pain Managment: Goal: General experience of comfort will improve Outcome: Progressing   Problem: Safety: Goal: Ability to remain free from injury will improve Outcome: Progressing   Problem: Skin Integrity: Goal: Risk for impaired skin integrity will decrease Outcome: Progressing   Problem: Education: Goal: Ability to describe self-care measures that may prevent or decrease complications (Diabetes Survival Skills Education) will improve Outcome: Progressing Goal: Individualized Educational Video(s) Outcome: Progressing   Problem: Coping: Goal: Ability to adjust to condition or change in health will improve Outcome: Progressing   Problem: Fluid Volume: Goal: Ability to maintain a balanced intake and output will improve Outcome: Progressing   Problem: Health Behavior/Discharge Planning: Goal: Ability to identify and utilize available resources and services will improve Outcome: Progressing Goal: Ability to manage health-related needs will improve Outcome: Progressing   Problem: Metabolic: Goal: Ability to maintain appropriate glucose levels will improve Outcome: Progressing   Problem: Nutritional: Goal: Maintenance of adequate nutrition will improve Outcome: Progressing Goal: Progress toward achieving an optimal weight will improve Outcome: Progressing   Problem: Skin Integrity: Goal: Risk for impaired skin integrity will decrease Outcome: Progressing   Problem:  Tissue Perfusion: Goal: Adequacy of tissue perfusion will improve Outcome: Progressing   Problem: Education: Goal: Knowledge of General Education information will improve Description: Including pain rating scale, medication(s)/side effects and non-pharmacologic comfort measures Outcome: Progressing   Problem: Health Behavior/Discharge Planning: Goal: Ability to manage health-related needs will improve Outcome: Progressing   Problem: Clinical Measurements: Goal: Ability to maintain clinical measurements within normal limits will improve Outcome: Progressing Goal: Will remain free from infection Outcome: Progressing Goal: Diagnostic test results will improve Outcome: Progressing  Goal: Respiratory complications will improve Outcome: Progressing Goal: Cardiovascular complication will be avoided Outcome: Progressing   Problem: Activity: Goal: Risk for activity intolerance will decrease Outcome: Progressing   Problem: Nutrition: Goal: Adequate nutrition will be maintained Outcome: Progressing   Problem: Coping: Goal: Level of anxiety will decrease Outcome: Progressing   Problem: Elimination: Goal: Will not experience complications related to bowel motility Outcome: Progressing Goal: Will not experience complications related to urinary retention Outcome: Progressing   Problem: Pain Managment: Goal: General experience of comfort will improve Outcome: Progressing   Problem: Safety: Goal: Ability to remain free from injury will improve Outcome: Progressing   Problem: Skin Integrity: Goal: Risk for impaired skin integrity will decrease Outcome: Progressing

## 2022-07-04 NOTE — Progress Notes (Signed)
PROGRESS NOTE    Jenna Powell  QXI:[id number] DOB: 18-Jul-1951 DOA: 07/01/2022 PCP: Merryl Hacker, No   Brief Narrative:  Jenna Powell is a 71 y.o. female with medical history significant of bipolar disorder, dementia, hypertension, diabetes who was found down at her facility.  Initial history obtained from facility and chart review, patient's mental status appears to be limited at baseline.  Patient episodes of vomiting with pinpoint pupils per facility found to be minimally responsive.  In the ED patient's mental status continued to wax and wane minimally improving, admitted for seizure evaluation by hospitalist team, neurology consulting.  Neurosurgery consulted due to mass as noted on CT.  Assessment & Plan:   Principal Problem:   Acute encephalopathy Active Problems:   Dementia without behavioral disturbance (HCC)   Bipolar disorder (HCC)   DM (diabetes mellitus), type 2 (HCC)   Essential hypertension   Meningioma (HCC)   Vasogenic edema (HCC)   Seizure ([place])  Questionable acute onset seizure Acute encephalopathy, improving Rule out polypharmacy -Found down at facility with emesis and pinpoint pupils, last known well 10 AM the day of admission -Initial concern for polypharmacy on multiple sedating medications - these have been held as below -CT head shows 3 x 3.5 cm mass concerning for meningioma with questionable vasogenic edema, similar in appearance to 2021 imaging if not somewhat larger -MRI ordered, still awaiting imaging; not done overnight -Neurology and neurosurgery consulted, appreciate insight and recommendations -Initiated Keppra per neurology - Hold clozapine, donepezil, risperidone, sertraline, trazodone, lithium, Cogentin given concern for polypharmacy   Meningioma -CT head with 3 x 3.5 cm mass consistent with meningioma which was increased from subtle findings in 2021.   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   Bipolar disorder Dementia - As above, holding home clozapine, donepezil, lithium, risperidone, sertraline, trazodone, Cogentin   Hypertension - Continue metoprolol   Diabetes - SSI   DVT prophylaxis:      Lovenox Code Status:              Full Family Communication: Son updated by phone previously  Status is: Inpatient  Dispo: The patient is from: Facility              Anticipated d/c is to: Same              Anticipated d/c date is: 48 to 72 hours pending clinical course              Patient currently not medically stable for discharge  Consultants:  Neurosurgery, neurology  Procedures:  None yet  Antimicrobials:  Not indicated  Subjective: No acute issues or events overnight, patient's mental status continues to wax and wane but moderately improved. MRI still not completed - was told 'patient couldn't tolerate it' per night sign out but unclear if it was attempted.given lack of documentation.  Objective: Vitals:   07/03/22 2053 07/04/22 0004 07/04/22 0420 07/04/22 0723  BP: 120/72 (!) 147/85 130/75 138/86  Pulse: 85 89 88 93  Resp: '20 20 18 20  '$ Temp: 99.3 F (37.4 C) 100.3 F (37.9 C) 99.1 F (37.3 C) 99.3 F (37.4 C)  TempSrc: Axillary Axillary Axillary Oral  SpO2: 99% 96% 100% 100%  Height:        Intake/Output Summary (Last 24 hours) at 07/04/2022 0758 Last data filed at 07/04/2022 0542 Gross per 24 hour  Intake 1187.28 ml  Output  1150 ml  Net 37.28 ml    There were no vitals filed for this visit.  Examination:  General:  Pleasantly resting in bed, No acute distress. HEENT:  Normocephalic atraumatic.  Sclerae nonicteric, noninjected.  Extraocular movements intact bilaterally. Neck:  Without mass or deformity.  Trachea is midline. Lungs:  Clear to auscultate bilaterally without rhonchi, wheeze, or rales. Heart:  Regular rate and rhythm.  Without murmurs,  rubs, or gallops. Abdomen:  Soft, nontender, nondistended.  Without guarding or rebound. Extremities: Without cyanosis, clubbing, edema, or obvious deformity. Vascular:  Dorsalis pedis and posterior tibial pulses palpable bilaterally. Skin:  Warm and dry, no erythema, no ulcerations.   Data Reviewed: I have personally reviewed following labs and imaging studies  CBC: Recent Labs  Lab 07/01/22 1225 07/01/22 1237 07/02/22 0515  WBC 5.8  --  9.2  NEUTROABS 4.5  --   --   HGB 12.5 13.3 13.5  HCT 41.3 39.0 43.4  MCV 90.6  --  88.4  PLT 134*  --  657    Basic Metabolic Panel: Recent Labs  Lab 07/01/22 1237 07/02/22 0515  NA 140 137  K 4.8 4.6  CL  --  105  CO2  --  23  GLUCOSE  --  117*  BUN  --  12  CREATININE  --  1.00  CALCIUM  --  9.7    GFR: CrCl cannot be calculated (Unknown ideal weight.). Liver Function Tests: Recent Labs  Lab 07/02/22 0515  AST 19  ALT 21  ALKPHOS 89  BILITOT 0.4  PROT 6.5  ALBUMIN 3.7    No results for input(s): "LIPASE", "AMYLASE" in the last 168 hours. Recent Labs  Lab 07/01/22 1515  AMMONIA 48*    Coagulation Profile: No results for input(s): "INR", "PROTIME" in the last 168 hours. Cardiac Enzymes: No results for input(s): "CKTOTAL", "CKMB", "CKMBINDEX", "TROPONINI" in the last 168 hours. BNP (last 3 results) No results for input(s): "PROBNP" in the last 8760 hours. HbA1C: No results for input(s): "HGBA1C" in the last 72 hours. CBG: Recent Labs  Lab 07/03/22 0612 07/03/22 1204 07/03/22 1704 07/03/22 2057 07/04/22 0608  GLUCAP 83 88 94 111* 105*    Lipid Profile: No results for input(s): "CHOL", "HDL", "LDLCALC", "TRIG", "CHOLHDL", "LDLDIRECT" in the last 72 hours. Thyroid Function Tests: Recent Labs    07/01/22 1225  TSH 1.306    Anemia Panel: No results for input(s): "VITAMINB12", "FOLATE", "FERRITIN", "TIBC", "IRON", "RETICCTPCT" in the last 72 hours. Sepsis Labs: Recent Labs  Lab 07/01/22 1225   LATICACIDVEN 5.4*     Recent Results (from the past 240 hour(s))  Blood Culture (Routine X 2)     Status: None (Preliminary result)   Collection Time: 07/01/22 12:21 PM   Specimen: BLOOD  Result Value Ref Range Status   Specimen Description BLOOD SITE NOT SPECIFIED  Final   Special Requests   Final    BOTTLES DRAWN AEROBIC AND ANAEROBIC Blood Culture results may not be optimal due to an inadequate volume of blood received in culture bottles   Culture   Final    NO GROWTH 3 DAYS Performed at [facility], [place] [address], [place], [place] [address]    Report Status PENDING  Incomplete  Blood Culture (Routine X 2)     Status: Abnormal (Preliminary result)   Collection Time: 07/01/22 12:26 PM   Specimen: BLOOD  Result Value Ref Range Status   Specimen Description BLOOD SITE NOT SPECIFIED  Final  Special Requests (A)  Final    AEROCOCCUS SPECIES Blood Culture results may not be optimal due to an inadequate volume of blood received in culture bottles   Culture   Final    NO GROWTH 3 DAYS Performed at [facility], [place] [address], [place], [place] [address]    Report Status PENDING  Incomplete  Resp Panel by RT-PCR (Flu A&B, Covid) Anterior Nasal Swab     Status: None   Collection Time: 07/01/22  3:15 PM   Specimen: Anterior Nasal Swab  Result Value Ref Range Status   SARS Coronavirus 2 by RT PCR NEGATIVE NEGATIVE Final    Comment: (NOTE) SARS-CoV-2 target nucleic acids are NOT DETECTED.  The SARS-CoV-2 RNA is generally detectable in upper respiratory specimens during the acute phase of infection. The lowest concentration of SARS-CoV-2 viral copies this assay can detect is 138 copies/mL. A negative result does not preclude SARS-Cov-2 infection and should not be used as the sole basis for treatment or other patient management decisions. A negative result may occur with  improper specimen collection/handling, submission of specimen other than nasopharyngeal swab,  presence of viral mutation(s) within the areas targeted by this assay, and inadequate number of viral copies(<138 copies/mL). A negative result must be combined with clinical observations, patient history, and epidemiological information. The expected result is Negative.  Fact Sheet for Patients:  EntrepreneurPulse.com.au  Fact Sheet for Healthcare Providers:  IncredibleEmployment.be  This test is no t yet approved or cleared by the [place] FDA and  has been authorized for detection and/or diagnosis of SARS-CoV-2 by FDA under an Emergency Use Authorization (EUA). This EUA will remain  in effect (meaning this test can be used) for the duration of the COVID-19 declaration under Section 564(b)(1) of the Act, 21 U.S.C.section 360bbb-3(b)(1), unless the authorization is terminated  or revoked sooner.       Influenza A by PCR NEGATIVE NEGATIVE Final   Influenza B by PCR NEGATIVE NEGATIVE Final    Comment: (NOTE) The Xpert Xpress SARS-CoV-2/FLU/RSV plus assay is intended as an aid in the diagnosis of influenza from Nasopharyngeal swab specimens and should not be used as a sole basis for treatment. Nasal washings and aspirates are unacceptable for Xpert Xpress SARS-CoV-2/FLU/RSV testing.  Fact Sheet for Patients: EntrepreneurPulse.com.au  Fact Sheet for Healthcare Providers: IncredibleEmployment.be  This test is not yet approved or cleared by the [place] FDA and has been authorized for detection and/or diagnosis of SARS-CoV-2 by FDA under an Emergency Use Authorization (EUA). This EUA will remain in effect (meaning this test can be used) for the duration of the COVID-19 declaration under Section 564(b)(1) of the Act, 21 U.S.C. section 360bbb-3(b)(1), unless the authorization is terminated or revoked.  Performed at [facility], [place] [address], [place], [place] [address]           Radiology Studies: MR Brain W and Wo Contrast  Result Date: 07/03/2022 CLINICAL DATA:  Meningioma include Stealth or Brainlab protocol; Headache, no red flags. EXAM: MRI HEAD WITHOUT AND WITH CONTRAST MR VENOGRAM HEAD WITHOUT AND WITH CONTRAST TECHNIQUE: Multiplanar, multi-echo pulse sequences of the brain and surrounding structures were acquired without and with intravenous contrast. Angiographic images of the intracranial venous structures were acquired using MRV technique without and with intravenous contrast. CONTRAST:  51m GADAVIST GADOBUTROL 1 MMOL/ML IV SOLN COMPARISON:  Head CT 07/01/2022 FINDINGS: MRI HEAD WITHOUT AND WITH CONTRAST The study is mildly motion degraded. Brain: A stealth protocol was performed  for surgical planning. This does not include all routine brain MRI sequences, and the lack of diffusion weighted imaging specifically limits assessment for acute infarct. An avidly, homogeneously enhancing extra-axial mass along the left aspect of the posterior falx in the parietal region measures 2.3 x 3.1 x 4.2 cm with an associated dural tail. There is local mass effect on the medial left parietal lobe with associated mild vasogenic edema in the left parietal white matter. The superior sagittal sinus is compressed but not frankly invaded by the mass. No other enhancing intracranial lesion is present. There is no midline shift or extra-axial fluid collection. Minimal scattered T2 hyperintensities in the cerebral white matter bilaterally are within normal limits for age. The ventricles are normal in size. There is a partially empty sella. Vascular: Compressed but grossly preserved superior sagittal sinus flow void on the FLAIR sequence. Skull and upper cervical spine: Unremarkable bone marrow signal. Sinuses/Orbits: No acute finding. Other: None. MR VENOGRAM HEAD WITHOUT AND WITH CONTRAST There is no evidence of dural venous sinus or deep cerebral vein thrombosis. The posterior aspect  of the superior sagittal sinus is compressed by the mass but remains patent. 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 Compression of the adjacent superior sagittal sinus which remains patent without gross invasion. Electronically Signed   By: Logan Bores M.D.   On: 07/03/2022 15:26   MR MRV HEAD W WO CONTRAST  Result Date: 07/03/2022 CLINICAL DATA:  Meningioma include Stealth or Brainlab protocol; Headache, no red flags. EXAM: MRI HEAD WITHOUT AND WITH CONTRAST MR VENOGRAM HEAD WITHOUT AND WITH CONTRAST TECHNIQUE: Multiplanar, multi-echo pulse sequences of the brain and surrounding structures were acquired without and with intravenous contrast. Angiographic images of the intracranial venous structures were acquired using MRV technique without and with intravenous contrast. CONTRAST:  43m GADAVIST GADOBUTROL 1 MMOL/ML IV SOLN COMPARISON:  Head CT 07/01/2022 FINDINGS: MRI HEAD WITHOUT AND WITH CONTRAST The study is mildly motion degraded. Brain: A stealth protocol was performed for surgical planning. This does not include all routine brain MRI sequences, and the lack of diffusion weighted imaging specifically limits assessment for acute infarct. An avidly, homogeneously enhancing extra-axial mass along the left aspect of the posterior falx in the parietal region measures 2.3 x 3.1 x 4.2 cm with an associated dural tail. There is local mass effect on the medial left parietal lobe with associated mild vasogenic edema in the left parietal white matter. The superior sagittal sinus is compressed but not frankly invaded by the mass. No other enhancing intracranial lesion is present. There is no midline shift or extra-axial fluid collection. Minimal scattered T2 hyperintensities in the cerebral white matter bilaterally are within normal limits for age. The ventricles are normal in size. There is a partially empty sella. Vascular: Compressed but  grossly preserved superior sagittal sinus flow void on the FLAIR sequence. Skull and upper cervical spine: Unremarkable bone marrow signal. Sinuses/Orbits: No acute finding. Other: None. MR VENOGRAM HEAD WITHOUT AND WITH CONTRAST There is no evidence of dural venous sinus or deep cerebral vein thrombosis. The posterior aspect of the superior sagittal sinus is compressed by the mass but remains patent. 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 Compression of the adjacent superior sagittal sinus which remains patent without gross invasion. Electronically Signed   By: ALogan BoresM.D.   On: 07/03/2022 15:26     Scheduled Meds:  benztropine  1 mg Oral BID   enoxaparin (LOVENOX) injection  40 mg Subcutaneous Q24H   insulin aspart  0-9 Units Subcutaneous TID WC   lithium carbonate  150 mg Oral Q breakfast   lithium carbonate  300 mg Oral Q supper   metoprolol succinate  25 mg Oral Daily   mirabegron ER  50 mg Oral Daily   pravastatin  20 mg Oral QHS   sodium chloride flush  3 mL Intravenous Q12H   Continuous Infusions:  sodium chloride Stopped (07/04/22 0017)   levETIRAcetam Stopped (07/04/22 0526)     LOS: 2 days   Time spent: 50mn  Leata Dominy C Kavin Weckwerth, DO [facility]  If 7PM-7AM, please contact night-coverage [email or web address]  07/04/2022, 7:58 AM

## 2022-07-05 DIAGNOSIS — G9340 Encephalopathy, unspecified: Secondary | ICD-10-CM | POA: Diagnosis not present

## 2022-07-05 LAB — GLUCOSE, CAPILLARY
Glucose-Capillary: 117 mg/dL — ABNORMAL HIGH (ref 70–99)
Glucose-Capillary: 103 mg/dL — ABNORMAL HIGH (ref 70–99)
Glucose-Capillary: 139 mg/dL — ABNORMAL HIGH (ref 70–99)
Glucose-Capillary: 115 mg/dL — ABNORMAL HIGH (ref 70–99)

## 2022-07-05 NOTE — Progress Notes (Signed)
Providing Compassionate, Quality Care - Together   Subjective: Nurse reports no new issues overnight. Patient's son is at the bedside.  Objective: Vital signs in last 24 hours: Temp:  [97.6 F (36.4 C)-99.2 F (37.3 C)] 98.3 F (36.8 C) (12/02 0800) Pulse Rate:  [66-91] 73 (12/02 0800) Resp:  [15-20] 18 (12/02 0800) BP: (120-137)/(64-81) 137/81 (12/02 0800) SpO2:  [96 %-99 %] 99 % (12/02 0800)  Intake/Output from previous day: 12/01 0701 - 12/02 0700 In: 700 [P.O.:600; IV Piggyback:100] Out: 575 [Urine:575] Intake/Output this shift: No intake/output data recorded.  Drowsy, responds to voice PERRLA Oriented to person, hospital Speech clear MAE, FC  Lab Results: No results for input(s): "WBC", "HGB", "HCT", "PLT" in the last 72 hours. BMET No results for input(s): "NA", "K", "CL", "CO2", "GLUCOSE", "BUN", "CREATININE", "CALCIUM" in the last 72 hours.  Studies/Results: MR Brain W and Wo Contrast  Result Date: 07/03/2022 CLINICAL DATA:  Meningioma include Stealth or Brainlab protocol; Headache, no red flags. EXAM: MRI HEAD WITHOUT AND WITH CONTRAST MR VENOGRAM HEAD WITHOUT AND WITH CONTRAST TECHNIQUE: Multiplanar, multi-echo pulse sequences of the brain and surrounding structures were acquired without and with intravenous contrast. Angiographic images of the intracranial venous structures were acquired using MRV technique without and with intravenous contrast. CONTRAST:  65m GADAVIST GADOBUTROL 1 MMOL/ML IV SOLN COMPARISON:  Head CT 07/01/2022 FINDINGS: MRI HEAD WITHOUT AND WITH CONTRAST The study is mildly motion degraded. Brain: A stealth protocol was performed for surgical planning. This does not include all routine brain MRI sequences, and the lack of diffusion weighted imaging specifically limits assessment for acute infarct. An avidly, homogeneously enhancing extra-axial mass along the left aspect of the posterior falx in the parietal region measures 2.3 x 3.1 x 4.2 cm  with an associated dural tail. There is local mass effect on the medial left parietal lobe with associated mild vasogenic edema in the left parietal white matter. The superior sagittal sinus is compressed but not frankly invaded by the mass. No other enhancing intracranial lesion is present. There is no midline shift or extra-axial fluid collection. Minimal scattered T2 hyperintensities in the cerebral white matter bilaterally are within normal limits for age. The ventricles are normal in size. There is a partially empty sella. Vascular: Compressed but grossly preserved superior sagittal sinus flow void on the FLAIR sequence. Skull and upper cervical spine: Unremarkable bone marrow signal. Sinuses/Orbits: No acute finding. Other: None. MR VENOGRAM HEAD WITHOUT AND WITH CONTRAST There is no evidence of dural venous sinus or deep cerebral vein thrombosis. The posterior aspect of the superior sagittal sinus is compressed by the mass but remains patent. 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 Compression of the adjacent superior sagittal sinus which remains patent without gross invasion. Electronically Signed   By: ALogan BoresM.D.   On: 07/03/2022 15:26   MR MRV HEAD W WO CONTRAST  Result Date: 07/03/2022 CLINICAL DATA:  Meningioma include Stealth or Brainlab protocol; Headache, no red flags. EXAM: MRI HEAD WITHOUT AND WITH CONTRAST MR VENOGRAM HEAD WITHOUT AND WITH CONTRAST TECHNIQUE: Multiplanar, multi-echo pulse sequences of the brain and surrounding structures were acquired without and with intravenous contrast. Angiographic images of the intracranial venous structures were acquired using MRV technique without and with intravenous contrast. CONTRAST:  133mGADAVIST GADOBUTROL 1 MMOL/ML IV SOLN COMPARISON:  Head CT 07/01/2022 FINDINGS: MRI HEAD WITHOUT AND WITH CONTRAST The study is mildly motion degraded. Brain: A stealth protocol  was  performed for surgical planning. This does not include all routine brain MRI sequences, and the lack of diffusion weighted imaging specifically limits assessment for acute infarct. An avidly, homogeneously enhancing extra-axial mass along the left aspect of the posterior falx in the parietal region measures 2.3 x 3.1 x 4.2 cm with an associated dural tail. There is local mass effect on the medial left parietal lobe with associated mild vasogenic edema in the left parietal white matter. The superior sagittal sinus is compressed but not frankly invaded by the mass. No other enhancing intracranial lesion is present. There is no midline shift or extra-axial fluid collection. Minimal scattered T2 hyperintensities in the cerebral white matter bilaterally are within normal limits for age. The ventricles are normal in size. There is a partially empty sella. Vascular: Compressed but grossly preserved superior sagittal sinus flow void on the FLAIR sequence. Skull and upper cervical spine: Unremarkable bone marrow signal. Sinuses/Orbits: No acute finding. Other: None. MR VENOGRAM HEAD WITHOUT AND WITH CONTRAST There is no evidence of dural venous sinus or deep cerebral vein thrombosis. The posterior aspect of the superior sagittal sinus is compressed by the mass but remains patent. 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 Compression of the adjacent superior sagittal sinus which remains patent without gross invasion. Electronically Signed   By: Logan Bores M.D.   On: 07/03/2022 15:26    Assessment/Plan: Patient with left parasagittal meningioma. Son at the bedside. All of his questions answered.   LOS: 3 days   -Tentative plan is for surgery next Tuesday with Dr. Marcello Moores.   Viona Gilmore, DNP, AGNP-C Nurse Practitioner  [facility] [place] [address] [place], [place], [place] [address] P: [phone]    F:  [phone]  07/05/2022, 10:30 AM

## 2022-07-05 NOTE — Progress Notes (Signed)
PROGRESS NOTE    Jenna Powell  KDT:[id number] DOB: 1951-01-06 DOA: 07/01/2022 PCP: Merryl Hacker, No   Brief Narrative:  Jenna Powell is a 71 y.o. female with medical history significant of bipolar disorder, dementia, hypertension, diabetes who was found down at her facility.  Initial history obtained from facility and chart review, patient's mental status appears to be limited at baseline.  Patient episodes of vomiting with pinpoint pupils per facility found to be minimally responsive.  In the ED patient's mental status continued to wax and wane minimally improving, admitted for seizure evaluation by hospitalist team, neurology consulting.  Neurosurgery consulted due to mass as noted on CT.  Assessment & Plan:   Principal Problem:   Acute encephalopathy Active Problems:   Dementia without behavioral disturbance (HCC)   Bipolar disorder (HCC)   DM (diabetes mellitus), type 2 (HCC)   Essential hypertension   Meningioma (HCC)   Vasogenic edema (HCC)   Seizure ([place])  Questionable acute onset seizure Acute encephalopathy, improving Rule out polypharmacy -Found down at facility with emesis and pinpoint pupils, last known well 10 AM the day of admission -Initial concern for polypharmacy on multiple sedating medications - these have been held as below -Continue Keppra per neurology - Hold clozapine, donepezil, risperidone, sertraline, trazodone, lithium, Cogentin given concern for polypharmacy   Meningioma -CT head shows 3 x 3.5 cm mass concerning for meningioma with questionable vasogenic edema  - MRI shows 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 -Neurology and neurosurgery consulted, discussed with family, will likely proceed with surgery next week on Tuesday the 5th.   Bipolar disorder Dementia - As above, holding home clozapine, donepezil, lithium, risperidone, sertraline, trazodone, Cogentin    Hypertension - Continue metoprolol   Diabetes - SSI   DVT prophylaxis:      Lovenox Code Status:              Full Family Communication: Son updated by phone previously  Status is: Inpatient  Dispo: The patient is from: Facility              Anticipated d/c is to: Same              Anticipated d/c date is: 48 to 72 hours pending clinical course              Patient currently not medically stable for discharge  Consultants:  Neurosurgery, neurology  Procedures:  None yet  Antimicrobials:  Not indicated  Subjective: No acute issues or events overnight, patient's mental status generally improving, denies nausea vomiting diarrhea constipation headache fevers chills or chest pain  Objective: Vitals:   07/04/22 1615 07/04/22 2018 07/04/22 2339 07/05/22 0300  BP: 134/71 123/77 124/71 120/64  Pulse: 75 77 75 66  Resp: '18 15 18 16  '$ Temp: 99.1 F (37.3 C) 99.2 F (37.3 C) 99 F (37.2 C) 97.6 F (36.4 C)  TempSrc: Oral Oral Axillary Axillary  SpO2: 96% 98% 98%   Height:        Intake/Output Summary (Last 24 hours) at 07/05/2022 0739 Last data filed at 07/05/2022 0100 Gross per 24 hour  Intake 700 ml  Output 575 ml  Net 125 ml    There were no vitals filed for this visit.  Examination:  General:  Pleasantly resting in bed, No acute distress. HEENT:  Normocephalic atraumatic.  Sclerae nonicteric, noninjected.  Extraocular movements intact bilaterally. Neck:  Without mass or deformity.  Trachea is  midline. Lungs:  Clear to auscultate bilaterally without rhonchi, wheeze, or rales. Heart:  Regular rate and rhythm.  Without murmurs, rubs, or gallops. Abdomen:  Soft, nontender, nondistended.  Without guarding or rebound. Extremities: Without cyanosis, clubbing, or obvious deformity. Skin:  Warm and dry, no erythema  Data Reviewed: I have personally reviewed following labs and imaging studies  CBC: Recent Labs  Lab 07/01/22 1225 07/01/22 1237 07/02/22 0515  WBC  5.8  --  9.2  NEUTROABS 4.5  --   --   HGB 12.5 13.3 13.5  HCT 41.3 39.0 43.4  MCV 90.6  --  88.4  PLT 134*  --  716    Basic Metabolic Panel: Recent Labs  Lab 07/01/22 1237 07/02/22 0515  NA 140 137  K 4.8 4.6  CL  --  105  CO2  --  23  GLUCOSE  --  117*  BUN  --  12  CREATININE  --  1.00  CALCIUM  --  9.7    GFR: CrCl cannot be calculated (Unknown ideal weight.). Liver Function Tests: Recent Labs  Lab 07/02/22 0515  AST 19  ALT 21  ALKPHOS 89  BILITOT 0.4  PROT 6.5  ALBUMIN 3.7    No results for input(s): "LIPASE", "AMYLASE" in the last 168 hours. Recent Labs  Lab 07/01/22 1515  AMMONIA 48*    Coagulation Profile: No results for input(s): "INR", "PROTIME" in the last 168 hours. Cardiac Enzymes: No results for input(s): "CKTOTAL", "CKMB", "CKMBINDEX", "TROPONINI" in the last 168 hours. BNP (last 3 results) No results for input(s): "PROBNP" in the last 8760 hours. HbA1C: No results for input(s): "HGBA1C" in the last 72 hours. CBG: Recent Labs  Lab 07/04/22 0608 07/04/22 1155 07/04/22 1658 07/04/22 2024 07/05/22 0608  GLUCAP 105* 117* 137* 111* 117*    Lipid Profile: No results for input(s): "CHOL", "HDL", "LDLCALC", "TRIG", "CHOLHDL", "LDLDIRECT" in the last 72 hours. Thyroid Function Tests: No results for input(s): "TSH", "T4TOTAL", "FREET4", "T3FREE", "THYROIDAB" in the last 72 hours.  Anemia Panel: No results for input(s): "VITAMINB12", "FOLATE", "FERRITIN", "TIBC", "IRON", "RETICCTPCT" in the last 72 hours. Sepsis Labs: Recent Labs  Lab 07/01/22 1225  LATICACIDVEN 5.4*     Recent Results (from the past 240 hour(s))  Blood Culture (Routine X 2)     Status: None (Preliminary result)   Collection Time: 07/01/22 12:21 PM   Specimen: BLOOD  Result Value Ref Range Status   Specimen Description BLOOD SITE NOT SPECIFIED  Final   Special Requests   Final    BOTTLES DRAWN AEROBIC AND ANAEROBIC Blood Culture results may not be optimal due  to an inadequate volume of blood received in culture bottles   Culture   Final    NO GROWTH 3 DAYS Performed at [facility], [place] [address], [place], [place] [address]    Report Status PENDING  Incomplete  Blood Culture (Routine X 2)     Status: Abnormal (Preliminary result)   Collection Time: 07/01/22 12:26 PM   Specimen: BLOOD  Result Value Ref Range Status   Specimen Description BLOOD SITE NOT SPECIFIED  Final   Special Requests (A)  Final    AEROCOCCUS SPECIES Blood Culture results may not be optimal due to an inadequate volume of blood received in culture bottles   Culture   Final    NO GROWTH 3 DAYS Performed at [facility], [place] [address] [place], [place] [address]    Report Status PENDING  Incomplete  Resp Panel by RT-PCR (Flu A&B, Covid) Anterior Nasal Swab     Status: None   Collection Time: 07/01/22  3:15 PM   Specimen: Anterior Nasal Swab  Result Value Ref Range Status   SARS Coronavirus 2 by RT PCR NEGATIVE NEGATIVE Final    Comment: (NOTE) SARS-CoV-2 target nucleic acids are NOT DETECTED.  The SARS-CoV-2 RNA is generally detectable in upper respiratory specimens during the acute phase of infection. The lowest concentration of SARS-CoV-2 viral copies this assay can detect is 138 copies/mL. A negative result does not preclude SARS-Cov-2 infection and should not be used as the sole basis for treatment or other patient management decisions. A negative result may occur with  improper specimen collection/handling, submission of specimen other than nasopharyngeal swab, presence of viral mutation(s) within the areas targeted by this assay, and inadequate number of viral copies(<138 copies/mL). A negative result must be combined with clinical observations, patient history, and epidemiological information. The expected result is Negative.  Fact Sheet for Patients:  EntrepreneurPulse.com.au  Fact Sheet for Healthcare Providers:   IncredibleEmployment.be  This test is no t yet approved or cleared by the [place] FDA and  has been authorized for detection and/or diagnosis of SARS-CoV-2 by FDA under an Emergency Use Authorization (EUA). This EUA will remain  in effect (meaning this test can be used) for the duration of the COVID-19 declaration under Section 564(b)(1) of the Act, 21 U.S.C.section 360bbb-3(b)(1), unless the authorization is terminated  or revoked sooner.       Influenza A by PCR NEGATIVE NEGATIVE Final   Influenza B by PCR NEGATIVE NEGATIVE Final    Comment: (NOTE) The Xpert Xpress SARS-CoV-2/FLU/RSV plus assay is intended as an aid in the diagnosis of influenza from Nasopharyngeal swab specimens and should not be used as a sole basis for treatment. Nasal washings and aspirates are unacceptable for Xpert Xpress SARS-CoV-2/FLU/RSV testing.  Fact Sheet for Patients: EntrepreneurPulse.com.au  Fact Sheet for Healthcare Providers: IncredibleEmployment.be  This test is not yet approved or cleared by the [place] FDA and has been authorized for detection and/or diagnosis of SARS-CoV-2 by FDA under an Emergency Use Authorization (EUA). This EUA will remain in effect (meaning this test can be used) for the duration of the COVID-19 declaration under Section 564(b)(1) of the Act, 21 U.S.C. section 360bbb-3(b)(1), unless the authorization is terminated or revoked.  Performed at [facility], [place] [address] [place], [place] [address]          Radiology Studies: MR Brain W and Wo Contrast  Result Date: 07/03/2022 CLINICAL DATA:  Meningioma include Stealth or Brainlab protocol; Headache, no red flags. EXAM: MRI HEAD WITHOUT AND WITH CONTRAST MR VENOGRAM HEAD WITHOUT AND WITH CONTRAST TECHNIQUE: Multiplanar, multi-echo pulse sequences of the brain and surrounding structures were acquired without and with intravenous contrast.  Angiographic images of the intracranial venous structures were acquired using MRV technique without and with intravenous contrast. CONTRAST:  43m GADAVIST GADOBUTROL 1 MMOL/ML IV SOLN COMPARISON:  Head CT 07/01/2022 FINDINGS: MRI HEAD WITHOUT AND WITH CONTRAST The study is mildly motion degraded. Brain: A stealth protocol was performed for surgical planning. This does not include all routine brain MRI sequences, and the lack of diffusion weighted imaging specifically limits assessment for acute infarct. An avidly, homogeneously enhancing extra-axial mass along the left aspect of the posterior falx in the parietal region measures 2.3 x 3.1 x 4.2 cm with an associated dural tail. There is local mass effect on  the medial left parietal lobe with associated mild vasogenic edema in the left parietal white matter. The superior sagittal sinus is compressed but not frankly invaded by the mass. No other enhancing intracranial lesion is present. There is no midline shift or extra-axial fluid collection. Minimal scattered T2 hyperintensities in the cerebral white matter bilaterally are within normal limits for age. The ventricles are normal in size. There is a partially empty sella. Vascular: Compressed but grossly preserved superior sagittal sinus flow void on the FLAIR sequence. Skull and upper cervical spine: Unremarkable bone marrow signal. Sinuses/Orbits: No acute finding. Other: None. MR VENOGRAM HEAD WITHOUT AND WITH CONTRAST There is no evidence of dural venous sinus or deep cerebral vein thrombosis. The posterior aspect of the superior sagittal sinus is compressed by the mass but remains patent. 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 Compression of the adjacent superior sagittal sinus which remains patent without gross invasion. Electronically Signed   By: Logan Bores M.D.   On: 07/03/2022 15:26   MR MRV HEAD W WO CONTRAST  Result Date:  07/03/2022 CLINICAL DATA:  Meningioma include Stealth or Brainlab protocol; Headache, no red flags. EXAM: MRI HEAD WITHOUT AND WITH CONTRAST MR VENOGRAM HEAD WITHOUT AND WITH CONTRAST TECHNIQUE: Multiplanar, multi-echo pulse sequences of the brain and surrounding structures were acquired without and with intravenous contrast. Angiographic images of the intracranial venous structures were acquired using MRV technique without and with intravenous contrast. CONTRAST:  40m GADAVIST GADOBUTROL 1 MMOL/ML IV SOLN COMPARISON:  Head CT 07/01/2022 FINDINGS: MRI HEAD WITHOUT AND WITH CONTRAST The study is mildly motion degraded. Brain: A stealth protocol was performed for surgical planning. This does not include all routine brain MRI sequences, and the lack of diffusion weighted imaging specifically limits assessment for acute infarct. An avidly, homogeneously enhancing extra-axial mass along the left aspect of the posterior falx in the parietal region measures 2.3 x 3.1 x 4.2 cm with an associated dural tail. There is local mass effect on the medial left parietal lobe with associated mild vasogenic edema in the left parietal white matter. The superior sagittal sinus is compressed but not frankly invaded by the mass. No other enhancing intracranial lesion is present. There is no midline shift or extra-axial fluid collection. Minimal scattered T2 hyperintensities in the cerebral white matter bilaterally are within normal limits for age. The ventricles are normal in size. There is a partially empty sella. Vascular: Compressed but grossly preserved superior sagittal sinus flow void on the FLAIR sequence. Skull and upper cervical spine: Unremarkable bone marrow signal. Sinuses/Orbits: No acute finding. Other: None. MR VENOGRAM HEAD WITHOUT AND WITH CONTRAST There is no evidence of dural venous sinus or deep cerebral vein thrombosis. The posterior aspect of the superior sagittal sinus is compressed by the mass but remains patent.  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 Compression of the adjacent superior sagittal sinus which remains patent without gross invasion. Electronically Signed   By: ALogan BoresM.D.   On: 07/03/2022 15:26     Scheduled Meds:  benztropine  1 mg Oral BID   enoxaparin (LOVENOX) injection  40 mg Subcutaneous Q24H   insulin aspart  0-9 Units Subcutaneous TID WC   lithium carbonate  150 mg Oral Q breakfast   lithium carbonate  300 mg Oral Q supper   metoprolol succinate  25 mg Oral Daily   mirabegron ER  50  mg Oral Daily   pravastatin  20 mg Oral QHS   sodium chloride flush  3 mL Intravenous Q12H   Continuous Infusions:  sodium chloride Stopped (07/04/22 0017)   levETIRAcetam 500 mg (07/05/22 0534)     LOS: 3 days   Time spent: 62mn  Tatem Holsonback C Xai Frerking, DO [facility]  If 7PM-7AM, please contact night-coverage [email or web address]  07/05/2022, 7:39 AM

## 2022-07-06 DIAGNOSIS — G9340 Encephalopathy, unspecified: Secondary | ICD-10-CM | POA: Diagnosis not present

## 2022-07-06 LAB — GLUCOSE, CAPILLARY
Glucose-Capillary: 112 mg/dL — ABNORMAL HIGH (ref 70–99)
Glucose-Capillary: 130 mg/dL — ABNORMAL HIGH (ref 70–99)
Glucose-Capillary: 98 mg/dL (ref 70–99)

## 2022-07-06 LAB — CULTURE, BLOOD (ROUTINE X 2)
Culture: NO GROWTH
Culture: NO GROWTH

## 2022-07-06 NOTE — Progress Notes (Signed)
PROGRESS NOTE    Jenna Powell  YPP:[id number] DOB: 07-29-1951 DOA: 07/01/2022 PCP: Merryl Hacker, No   Brief Narrative:  Jenna Powell is a 71 y.o. female with medical history significant of bipolar disorder, dementia, hypertension, diabetes who was found down at her facility.  Initial history obtained from facility and chart review, patient's mental status appears to be limited at baseline.  Patient episodes of vomiting with pinpoint pupils per facility found to be minimally responsive.  In the ED patient's mental status continued to wax and wane minimally improving, admitted for seizure evaluation by hospitalist team, neurology consulting.  Neurosurgery consulted due to mass as noted on CT.  Assessment & Plan:   Principal Problem:   Acute encephalopathy Active Problems:   Dementia without behavioral disturbance (HCC)   Bipolar disorder (HCC)   DM (diabetes mellitus), type 2 (HCC)   Essential hypertension   Meningioma (HCC)   Vasogenic edema (HCC)   Seizure ([place])  Questionable acute onset seizure Acute encephalopathy, improving Rule out polypharmacy -Found down at facility with emesis and pinpoint pupils, last known well 10 AM the day of admission -Initial concern for polypharmacy on multiple sedating medications - these have been held as below -Continue Keppra per neurology - Hold clozapine, donepezil, risperidone, sertraline, trazodone, lithium, Cogentin given concern for polypharmacy   Meningioma -CT head shows 3 x 3.5 cm mass concerning for meningioma with questionable vasogenic edema  - MRI shows 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 -Neurology and neurosurgery consulted, discussed with family, will likely proceed with surgery next week on Tuesday the 5th.   Bipolar disorder Dementia - As above, holding home clozapine, donepezil, lithium, risperidone, sertraline, trazodone, Cogentin    Hypertension - Continue metoprolol   Diabetes - SSI  Contaminated blood culture - 1/2 positive for aerococcus - no signs/symptoms of infection, follow   DVT prophylaxis:      Lovenox Code Status:              Full Family Communication: Son updated by phone previously  Status is: Inpatient  Dispo: The patient is from: Facility              Anticipated d/c is to: Same              Anticipated d/c date is: 48 to 72 hours pending clinical course              Patient currently not medically stable for discharge  Consultants:  Neurosurgery, neurology  Procedures:  None yet  Antimicrobials:  Not indicated  Subjective: No acute issues or events overnight, patient's mental status generally improving, denies nausea vomiting diarrhea constipation headache fevers chills or chest pain  Objective: Vitals:   07/05/22 1203 07/05/22 1534 07/05/22 2000 07/06/22 0400  BP: 131/81 114/82 (!) 100/49 115/66  Pulse: 65 75  71  Resp: '19 19 18 18  '$ Temp:  98.3 F (36.8 C) 99 F (37.2 C) 99.1 F (37.3 C)  TempSrc:  Oral Oral Oral  SpO2: 100% 98% 100% 98%  Height:        Intake/Output Summary (Last 24 hours) at 07/06/2022 0743 Last data filed at 07/06/2022 0400 Gross per 24 hour  Intake 200 ml  Output 400 ml  Net -200 ml    There were no vitals filed for this visit.  Examination:  General:  Pleasantly resting in bed, No acute distress. HEENT:  Normocephalic atraumatic.  Sclerae nonicteric, noninjected.  Extraocular movements intact bilaterally. Neck:  Without mass or deformity.  Trachea is midline. Lungs:  Clear to auscultate bilaterally without rhonchi, wheeze, or rales. Heart:  Regular rate and rhythm.  Without murmurs, rubs, or gallops. Abdomen:  Soft, nontender, nondistended.  Without guarding or rebound. Extremities: Without cyanosis, clubbing, or obvious deformity. Skin:  Warm and dry, no erythema  Data Reviewed: I have personally reviewed following labs and imaging  studies  CBC: Recent Labs  Lab 07/01/22 1225 07/01/22 1237 07/02/22 0515  WBC 5.8  --  9.2  NEUTROABS 4.5  --   --   HGB 12.5 13.3 13.5  HCT 41.3 39.0 43.4  MCV 90.6  --  88.4  PLT 134*  --  962    Basic Metabolic Panel: Recent Labs  Lab 07/01/22 1237 07/02/22 0515  NA 140 137  K 4.8 4.6  CL  --  105  CO2  --  23  GLUCOSE  --  117*  BUN  --  12  CREATININE  --  1.00  CALCIUM  --  9.7    GFR: CrCl cannot be calculated (Unknown ideal weight.). Liver Function Tests: Recent Labs  Lab 07/02/22 0515  AST 19  ALT 21  ALKPHOS 89  BILITOT 0.4  PROT 6.5  ALBUMIN 3.7    No results for input(s): "LIPASE", "AMYLASE" in the last 168 hours. Recent Labs  Lab 07/01/22 1515  AMMONIA 48*    Coagulation Profile: No results for input(s): "INR", "PROTIME" in the last 168 hours. Cardiac Enzymes: No results for input(s): "CKTOTAL", "CKMB", "CKMBINDEX", "TROPONINI" in the last 168 hours. BNP (last 3 results) No results for input(s): "PROBNP" in the last 8760 hours. HbA1C: No results for input(s): "HGBA1C" in the last 72 hours. CBG: Recent Labs  Lab 07/05/22 0608 07/05/22 1204 07/05/22 1717 07/05/22 2305 07/06/22 0623  GLUCAP 117* 103* 139* 115* 112*    Lipid Profile: No results for input(s): "CHOL", "HDL", "LDLCALC", "TRIG", "CHOLHDL", "LDLDIRECT" in the last 72 hours. Thyroid Function Tests: No results for input(s): "TSH", "T4TOTAL", "FREET4", "T3FREE", "THYROIDAB" in the last 72 hours.  Anemia Panel: No results for input(s): "VITAMINB12", "FOLATE", "FERRITIN", "TIBC", "IRON", "RETICCTPCT" in the last 72 hours. Sepsis Labs: Recent Labs  Lab 07/01/22 1225  LATICACIDVEN 5.4*     Recent Results (from the past 240 hour(s))  Blood Culture (Routine X 2)     Status: None (Preliminary result)   Collection Time: 07/01/22 12:21 PM   Specimen: BLOOD  Result Value Ref Range Status   Specimen Description BLOOD SITE NOT SPECIFIED  Final   Special Requests   Final     BOTTLES DRAWN AEROBIC AND ANAEROBIC Blood Culture results may not be optimal due to an inadequate volume of blood received in culture bottles   Culture   Final    NO GROWTH 4 DAYS Performed at [facility], [place] [address] [place], [place] [address]    Report Status PENDING  Incomplete  Blood Culture (Routine X 2)     Status: Abnormal (Preliminary result)   Collection Time: 07/01/22 12:26 PM   Specimen: BLOOD  Result Value Ref Range Status   Specimen Description BLOOD SITE NOT SPECIFIED  Final   Special Requests (A)  Final    AEROCOCCUS SPECIES Blood Culture results may not be optimal due to an inadequate volume of blood received in culture bottles   Culture   Final    NO GROWTH 4 DAYS Performed at [facility], [address] [place], [place] [address]    Report Status PENDING  Incomplete  Resp Panel by RT-PCR (Flu A&B, Covid) Anterior Nasal Swab     Status: None   Collection Time: 07/01/22  3:15 PM   Specimen: Anterior Nasal Swab  Result Value Ref Range Status   SARS Coronavirus 2 by RT PCR NEGATIVE NEGATIVE Final    Comment: (NOTE) SARS-CoV-2 target nucleic acids are NOT DETECTED.  The SARS-CoV-2 RNA is generally detectable in upper respiratory specimens during the acute phase of infection. The lowest concentration of SARS-CoV-2 viral copies this assay can detect is 138 copies/mL. A negative result does not preclude SARS-Cov-2 infection and should not be used as the sole basis for treatment or other patient management decisions. A negative result may occur with  improper specimen collection/handling, submission of specimen other than nasopharyngeal swab, presence of viral mutation(s) within the areas targeted by this assay, and inadequate number of viral copies(<138 copies/mL). A negative result must be combined with clinical observations, patient history, and epidemiological information. The expected result is Negative.  Fact Sheet for Patients:   EntrepreneurPulse.com.au  Fact Sheet for Healthcare Providers:  IncredibleEmployment.be  This test is no t yet approved or cleared by the [place] FDA and  has been authorized for detection and/or diagnosis of SARS-CoV-2 by FDA under an Emergency Use Authorization (EUA). This EUA will remain  in effect (meaning this test can be used) for the duration of the COVID-19 declaration under Section 564(b)(1) of the Act, 21 U.S.C.section 360bbb-3(b)(1), unless the authorization is terminated  or revoked sooner.       Influenza A by PCR NEGATIVE NEGATIVE Final   Influenza B by PCR NEGATIVE NEGATIVE Final    Comment: (NOTE) The Xpert Xpress SARS-CoV-2/FLU/RSV plus assay is intended as an aid in the diagnosis of influenza from Nasopharyngeal swab specimens and should not be used as a sole basis for treatment. Nasal washings and aspirates are unacceptable for Xpert Xpress SARS-CoV-2/FLU/RSV testing.  Fact Sheet for Patients: EntrepreneurPulse.com.au  Fact Sheet for Healthcare Providers: IncredibleEmployment.be  This test is not yet approved or cleared by the [place] FDA and has been authorized for detection and/or diagnosis of SARS-CoV-2 by FDA under an Emergency Use Authorization (EUA). This EUA will remain in effect (meaning this test can be used) for the duration of the COVID-19 declaration under Section 564(b)(1) of the Act, 21 U.S.C. section 360bbb-3(b)(1), unless the authorization is terminated or revoked.  Performed at [facility], [place] [address] [place], [place] [address]          Radiology Studies: No results found.   Scheduled Meds:  benztropine  1 mg Oral BID   enoxaparin (LOVENOX) injection  40 mg Subcutaneous Q24H   insulin aspart  0-9 Units Subcutaneous TID WC   lithium carbonate  150 mg Oral Q breakfast   lithium carbonate  300 mg Oral Q supper   metoprolol succinate   25 mg Oral Daily   mirabegron ER  50 mg Oral Daily   pravastatin  20 mg Oral QHS   sodium chloride flush  3 mL Intravenous Q12H   Continuous Infusions:  sodium chloride Stopped (07/04/22 0017)   levETIRAcetam 500 mg (07/05/22 1642)     LOS: 4 days   Time spent: 29mn  Winifred Balogh C Helina Hullum, DO [facility]  If 7PM-7AM, please contact night-coverage [email or web address]  07/06/2022, 7:43 AM

## 2022-07-06 NOTE — Progress Notes (Signed)
   Providing Compassionate, Quality Care - Together   Subjective: Patient reports no new issues. There is no family at the bedside during today's assessment.  Objective: Vital signs in last 24 hours: Temp:  [98.3 F (36.8 C)-99.1 F (37.3 C)] 99.1 F (37.3 C) (12/03 0400) Pulse Rate:  [65-75] 71 (12/03 0400) Resp:  [18-19] 18 (12/03 0400) BP: (100-131)/(49-82) 115/66 (12/03 0400) SpO2:  [98 %-100 %] 98 % (12/03 0400)  Intake/Output from previous day: 12/02 0701 - 12/03 0700 In: 200 [IV Piggyback:200] Out: 400 [Urine:400] Intake/Output this shift: No intake/output data recorded.  Drowsy, responds to voice PERRLA Oriented to person, hospital Speech clear MAE, FC  Lab Results: No results for input(s): "WBC", "HGB", "HCT", "PLT" in the last 72 hours. BMET No results for input(s): "NA", "K", "CL", "CO2", "GLUCOSE", "BUN", "CREATININE", "CALCIUM" in the last 72 hours.  Studies/Results: No results found.  Assessment/Plan: Patient with left parasagittal meningioma. The tentative plan is for surgery on 07/08/2022 with Dr. Marcello Moores.   LOS: 4 days     Viona Gilmore, DNP, AGNP-C Nurse Practitioner  [facility] [place] [address], [place], [place] [address] P: [phone]    F: [phone]  07/06/2022, 11:03 AM

## 2022-07-06 NOTE — Progress Notes (Signed)
This patient is refusing telemetry at this point. She has removed the leads and electrodes 5 times in the last 2 hours. She states that she does not want them on.

## 2022-07-07 DIAGNOSIS — G9340 Encephalopathy, unspecified: Secondary | ICD-10-CM | POA: Diagnosis not present

## 2022-07-07 LAB — CBC
HCT: 37.1 % (ref 36.0–46.0)
Hemoglobin: 11.8 g/dL — ABNORMAL LOW (ref 12.0–15.0)
MCH: 27.6 pg (ref 26.0–34.0)
MCHC: 31.8 g/dL (ref 30.0–36.0)
MCV: 86.7 fL (ref 80.0–100.0)
Platelets: 187 10*3/uL (ref 150–400)
RBC: 4.28 MIL/uL (ref 3.87–5.11)
RDW: 12.5 % (ref 11.5–15.5)
WBC: 4.9 10*3/uL (ref 4.0–10.5)
nRBC: 0 % (ref 0.0–0.2)

## 2022-07-07 LAB — BASIC METABOLIC PANEL
Anion gap: 9 (ref 5–15)
BUN: 16 mg/dL (ref 8–23)
CO2: 20 mmol/L — ABNORMAL LOW (ref 22–32)
Calcium: 8.9 mg/dL (ref 8.9–10.3)
Chloride: 110 mmol/L (ref 98–111)
Creatinine, Ser: 1.05 mg/dL — ABNORMAL HIGH (ref 0.44–1.00)
GFR, Estimated: 57 mL/min — ABNORMAL LOW (ref >60)
Glucose, Bld: 104 mg/dL — ABNORMAL HIGH (ref 70–99)
Potassium: 4.5 mmol/L (ref 3.5–5.1)
Sodium: 139 mmol/L (ref 135–145)

## 2022-07-07 LAB — GLUCOSE, CAPILLARY
Glucose-Capillary: 105 mg/dL — ABNORMAL HIGH (ref 70–99)
Glucose-Capillary: 83 mg/dL (ref 70–99)
Glucose-Capillary: 99 mg/dL (ref 70–99)
Glucose-Capillary: 101 mg/dL — ABNORMAL HIGH (ref 70–99)

## 2022-07-07 MED ORDER — POTASSIUM CHLORIDE IN NACL 20-0.9 MEQ/L-% IV SOLN
INTRAVENOUS | Status: DC
  Filled 2022-07-07 (×6): qty 1000

## 2022-07-07 NOTE — Progress Notes (Signed)
PROGRESS NOTE    Jenna Powell  ATF:[id number] DOB: 12/18/1950 DOA: 07/01/2022 PCP: Merryl Hacker, No   Brief Narrative:  Jenna Powell is a 71 y.o. female with medical history significant of bipolar disorder, dementia, hypertension, diabetes who was found down at her facility.  Initial history obtained from facility and chart review, patient's mental status appears to be limited at baseline.  Patient episodes of vomiting with pinpoint pupils per facility found to be minimally responsive.  In the ED patient's mental status continued to wax and wane minimally improving, admitted for seizure evaluation by hospitalist team, neurology consulting.  Neurosurgery consulted due to mass as noted on CT.  Assessment & Plan:   Principal Problem:   Acute encephalopathy Active Problems:   Dementia without behavioral disturbance (HCC)   Bipolar disorder (HCC)   DM (diabetes mellitus), type 2 (HCC)   Essential hypertension   Meningioma (HCC)   Vasogenic edema (HCC)   Seizure ([place])  Questionable acute onset seizure Acute encephalopathy, improving Rule out polypharmacy -Found down at facility with emesis and pinpoint pupils, last known well 10 AM the day of admission -Initial concern for polypharmacy on multiple sedating medications - these have been held as below -Continue Keppra per neurology - Hold clozapine, donepezil, risperidone, sertraline, trazodone, lithium, Cogentin given concern for polypharmacy   Meningioma -CT head shows 3 x 3.5 cm mass concerning for meningioma with questionable vasogenic edema  - MRI shows 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 -Neurology and neurosurgery consulted, discussed with family, will likely proceed with surgery next week on Tuesday the 5th.   Bipolar disorder Dementia - As above, holding home clozapine, donepezil, lithium, risperidone, sertraline, trazodone, Cogentin    Hypertension - Continue metoprolol   Diabetes - SSI  Contaminated blood culture - 1/2 positive for aerococcus - no signs/symptoms of infection, follow   DVT prophylaxis:      Lovenox Code Status:              Full Family Communication: Son updated by phone previously  Status is: Inpatient  Dispo: The patient is from: Facility              Anticipated d/c is to: Same              Anticipated d/c date is: 48 to 72 hours pending clinical course              Patient currently not medically stable for discharge  Consultants:  Neurosurgery, neurology  Procedures:  None yet  Antimicrobials:  Not indicated  Subjective: No acute issues or events overnight, patient's mental status generally improving, denies nausea vomiting diarrhea constipation headache fevers chills or chest pain  Objective: Vitals:   07/05/22 2000 07/06/22 0400 07/06/22 2300 07/07/22 0421  BP: (!) 100/49 115/66 110/65 (!) 101/50  Pulse:  71 66 67  Resp: '18 18 16 16  '$ Temp: 99 F (37.2 C) 99.1 F (37.3 C) 98.7 F (37.1 C) 98.3 F (36.8 C)  TempSrc: Oral Oral Oral Oral  SpO2: 100% 98% 100% 98%  Height:        Intake/Output Summary (Last 24 hours) at 07/07/2022 0739 Last data filed at 07/06/2022 1942 Gross per 24 hour  Intake 200 ml  Output 300 ml  Net -100 ml    There were no vitals filed for this visit.  Examination:  General:  Pleasantly resting in bed, No acute distress. HEENT:  Normocephalic atraumatic.  Sclerae nonicteric, noninjected.  Extraocular movements intact bilaterally. Neck:  Without mass or deformity.  Trachea is midline. Lungs:  Clear to auscultate bilaterally without rhonchi, wheeze, or rales. Heart:  Regular rate and rhythm.  Without murmurs, rubs, or gallops. Abdomen:  Soft, nontender, nondistended.  Without guarding or rebound. Extremities: Without cyanosis, clubbing, or obvious deformity. Skin:  Warm and dry, no erythema  Data Reviewed: I have personally reviewed  following labs and imaging studies  CBC: Recent Labs  Lab 07/01/22 1225 07/01/22 1237 07/02/22 0515  WBC 5.8  --  9.2  NEUTROABS 4.5  --   --   HGB 12.5 13.3 13.5  HCT 41.3 39.0 43.4  MCV 90.6  --  88.4  PLT 134*  --  509    Basic Metabolic Panel: Recent Labs  Lab 07/01/22 1237 07/02/22 0515  NA 140 137  K 4.8 4.6  CL  --  105  CO2  --  23  GLUCOSE  --  117*  BUN  --  12  CREATININE  --  1.00  CALCIUM  --  9.7    GFR: CrCl cannot be calculated (Unknown ideal weight.). Liver Function Tests: Recent Labs  Lab 07/02/22 0515  AST 19  ALT 21  ALKPHOS 89  BILITOT 0.4  PROT 6.5  ALBUMIN 3.7    No results for input(s): "LIPASE", "AMYLASE" in the last 168 hours. Recent Labs  Lab 07/01/22 1515  AMMONIA 48*    Coagulation Profile: No results for input(s): "INR", "PROTIME" in the last 168 hours. Cardiac Enzymes: No results for input(s): "CKTOTAL", "CKMB", "CKMBINDEX", "TROPONINI" in the last 168 hours. BNP (last 3 results) No results for input(s): "PROBNP" in the last 8760 hours. HbA1C: No results for input(s): "HGBA1C" in the last 72 hours. CBG: Recent Labs  Lab 07/05/22 2305 07/06/22 0623 07/06/22 1144 07/06/22 1607 07/07/22 0647  GLUCAP 115* 112* 130* 98 105*    Lipid Profile: No results for input(s): "CHOL", "HDL", "LDLCALC", "TRIG", "CHOLHDL", "LDLDIRECT" in the last 72 hours. Thyroid Function Tests: No results for input(s): "TSH", "T4TOTAL", "FREET4", "T3FREE", "THYROIDAB" in the last 72 hours.  Anemia Panel: No results for input(s): "VITAMINB12", "FOLATE", "FERRITIN", "TIBC", "IRON", "RETICCTPCT" in the last 72 hours. Sepsis Labs: Recent Labs  Lab 07/01/22 1225  LATICACIDVEN 5.4*     Recent Results (from the past 240 hour(s))  Blood Culture (Routine X 2)     Status: None   Collection Time: 07/01/22 12:21 PM   Specimen: BLOOD  Result Value Ref Range Status   Specimen Description BLOOD SITE NOT SPECIFIED  Final   Special Requests    Final    BOTTLES DRAWN AEROBIC AND ANAEROBIC Blood Culture results may not be optimal due to an inadequate volume of blood received in culture bottles   Culture   Final    NO GROWTH 5 DAYS Performed at [facility], [place] [address] [place], [place] [address]    Report Status 07/06/2022 FINAL  Final  Blood Culture (Routine X 2)     Status: Abnormal   Collection Time: 07/01/22 12:26 PM   Specimen: BLOOD  Result Value Ref Range Status   Specimen Description BLOOD SITE NOT SPECIFIED  Final   Special Requests (A)  Final    AEROCOCCUS SPECIES Blood Culture results may not be optimal due to an inadequate volume of blood received in culture bottles   Culture   Final    NO GROWTH 5 DAYS Performed at [facility],  [address], [place], [place] [address]    Report Status 07/06/2022 FINAL  Final  Resp Panel by RT-PCR (Flu A&B, Covid) Anterior Nasal Swab     Status: None   Collection Time: 07/01/22  3:15 PM   Specimen: Anterior Nasal Swab  Result Value Ref Range Status   SARS Coronavirus 2 by RT PCR NEGATIVE NEGATIVE Final    Comment: (NOTE) SARS-CoV-2 target nucleic acids are NOT DETECTED.  The SARS-CoV-2 RNA is generally detectable in upper respiratory specimens during the acute phase of infection. The lowest concentration of SARS-CoV-2 viral copies this assay can detect is 138 copies/mL. A negative result does not preclude SARS-Cov-2 infection and should not be used as the sole basis for treatment or other patient management decisions. A negative result may occur with  improper specimen collection/handling, submission of specimen other than nasopharyngeal swab, presence of viral mutation(s) within the areas targeted by this assay, and inadequate number of viral copies(<138 copies/mL). A negative result must be combined with clinical observations, patient history, and epidemiological information. The expected result is Negative.  Fact Sheet for Patients:   EntrepreneurPulse.com.au  Fact Sheet for Healthcare Providers:  IncredibleEmployment.be  This test is no t yet approved or cleared by the [place] FDA and  has been authorized for detection and/or diagnosis of SARS-CoV-2 by FDA under an Emergency Use Authorization (EUA). This EUA will remain  in effect (meaning this test can be used) for the duration of the COVID-19 declaration under Section 564(b)(1) of the Act, 21 U.S.C.section 360bbb-3(b)(1), unless the authorization is terminated  or revoked sooner.       Influenza A by PCR NEGATIVE NEGATIVE Final   Influenza B by PCR NEGATIVE NEGATIVE Final    Comment: (NOTE) The Xpert Xpress SARS-CoV-2/FLU/RSV plus assay is intended as an aid in the diagnosis of influenza from Nasopharyngeal swab specimens and should not be used as a sole basis for treatment. Nasal washings and aspirates are unacceptable for Xpert Xpress SARS-CoV-2/FLU/RSV testing.  Fact Sheet for Patients: EntrepreneurPulse.com.au  Fact Sheet for Healthcare Providers: IncredibleEmployment.be  This test is not yet approved or cleared by the [place] FDA and has been authorized for detection and/or diagnosis of SARS-CoV-2 by FDA under an Emergency Use Authorization (EUA). This EUA will remain in effect (meaning this test can be used) for the duration of the COVID-19 declaration under Section 564(b)(1) of the Act, 21 U.S.C. section 360bbb-3(b)(1), unless the authorization is terminated or revoked.  Performed at [facility], [place] [address], [place], [place] [address]          Radiology Studies: No results found.   Scheduled Meds:  benztropine  1 mg Oral BID   enoxaparin (LOVENOX) injection  40 mg Subcutaneous Q24H   insulin aspart  0-9 Units Subcutaneous TID WC   lithium carbonate  150 mg Oral Q breakfast   lithium carbonate  300 mg Oral Q supper   metoprolol succinate   25 mg Oral Daily   mirabegron ER  50 mg Oral Daily   pravastatin  20 mg Oral QHS   sodium chloride flush  3 mL Intravenous Q12H   Continuous Infusions:  sodium chloride Stopped (07/04/22 0017)   levETIRAcetam 500 mg (07/07/22 0511)     LOS: 5 days   Time spent: 78mn  Ronna Herskowitz C Brigitte Soderberg, DO [facility]  If 7PM-7AM, please contact night-coverage [email or web address]  07/07/2022, 7:39 AM

## 2022-07-07 NOTE — Progress Notes (Signed)
Subjective: Patient reports no headache today  Objective: Vital signs in last 24 hours: Temp:  [98.3 F (36.8 C)-99.2 F (37.3 C)] 98.9 F (37.2 C) (12/04 2024) Pulse Rate:  [59-69] 66 (12/04 2024) Resp:  [16-20] 18 (12/04 2024) BP: (87-110)/(42-75) 96/45 (12/04 2024) SpO2:  [95 %-100 %] 99 % (12/04 2024)  Intake/Output from previous day: 12/03 0701 - 12/04 0700 In: 200 [IV Piggyback:200] Out: 300 [Urine:300] Intake/Output this shift: Total I/O In: 183.6 [IV Piggyback:183.6] Out: -   Awake, alert Conversant, confused  Lab Results: Recent Labs    07/07/22 0736  WBC 4.9  HGB 11.8*  HCT 37.1  PLT 187   BMET Recent Labs    07/07/22 0736  NA 139  K 4.5  CL 110  CO2 20*  GLUCOSE 104*  BUN 16  CREATININE 1.05*  CALCIUM 8.9    Studies/Results: No results found.  Assessment/Plan: Left precuneus parasagittal meningioma - surgical resection tomorrow   Vallarie Mare 07/07/2022, 10:49 PM

## 2022-07-07 NOTE — TOC Progression Note (Signed)
Transition of Care [facility]) - Progression Note    Patient Details  Name: Jenna Powell MRN: [id number] Date of Birth: 1950/09/25  Transition of Care [facility]) CM/SW Contact  Pollie Friar, RN Phone Number: 07/07/2022, 11:30 AM  Clinical Narrative:    Patient with left parasagittal meningioma. The tentative plan is for surgery on 07/08/2022 with neurosurgery.  TOC following for d/c needs.      Expected Discharge Plan and Services                                                 Social Determinants of Health (SDOH) Interventions    Readmission Risk Interventions     No data to display

## 2022-07-08 ENCOUNTER — Encounter (HOSPITAL_COMMUNITY): Payer: Self-pay | Admitting: Internal Medicine

## 2022-07-08 ENCOUNTER — Inpatient Hospital Stay (HOSPITAL_COMMUNITY)
Admission: EM | Disposition: A | Discharge status: Skilled Nursing Facility | Payer: Self-pay | Source: Skilled Nursing Facility | Attending: Internal Medicine

## 2022-07-08 ENCOUNTER — Inpatient Hospital Stay (HOSPITAL_COMMUNITY): Payer: Medicare Other | Admitting: Anesthesiology

## 2022-07-08 ENCOUNTER — Other Ambulatory Visit: Payer: Self-pay

## 2022-07-08 ENCOUNTER — Inpatient Hospital Stay (HOSPITAL_COMMUNITY): Payer: Medicare Other

## 2022-07-08 DIAGNOSIS — I10 Essential (primary) hypertension: Secondary | ICD-10-CM

## 2022-07-08 DIAGNOSIS — E119 Type 2 diabetes mellitus without complications: Secondary | ICD-10-CM

## 2022-07-08 DIAGNOSIS — G9340 Encephalopathy, unspecified: Secondary | ICD-10-CM | POA: Diagnosis not present

## 2022-07-08 DIAGNOSIS — C709 Malignant neoplasm of meninges, unspecified: Secondary | ICD-10-CM

## 2022-07-08 DIAGNOSIS — E669 Obesity, unspecified: Secondary | ICD-10-CM

## 2022-07-08 HISTORY — PX: APPLICATION OF CRANIAL NAVIGATION: SHX6578

## 2022-07-08 HISTORY — PX: CRANIOTOMY: SHX93

## 2022-07-08 LAB — ABO/RH: ABO/RH(D): O POS

## 2022-07-08 LAB — GLUCOSE, CAPILLARY
Glucose-Capillary: 97 mg/dL (ref 70–99)
Glucose-Capillary: 97 mg/dL (ref 70–99)
Glucose-Capillary: 86 mg/dL (ref 70–99)
Glucose-Capillary: 90 mg/dL (ref 70–99)

## 2022-07-08 LAB — TYPE AND SCREEN
ABO/RH(D): O POS
Antibody Screen: NEGATIVE

## 2022-07-08 LAB — SURGICAL PCR SCREEN
MRSA, PCR: NEGATIVE
Staphylococcus aureus: NEGATIVE

## 2022-07-08 LAB — CREATININE, SERUM
Creatinine, Ser: 1.02 mg/dL — ABNORMAL HIGH (ref 0.44–1.00)
GFR, Estimated: 59 mL/min — ABNORMAL LOW (ref >60)

## 2022-07-08 SURGERY — CRANIOTOMY TUMOR EXCISION
Anesthesia: General | Laterality: Left

## 2022-07-08 MED ORDER — MUPIROCIN 2 % EX OINT
1.0000 | TOPICAL_OINTMENT | Freq: Two times a day (BID) | CUTANEOUS | Status: DC
  Administered 2022-07-09 – 2022-07-12 (×8): 1 via NASAL
  Filled 2022-07-08 (×3): qty 22

## 2022-07-08 MED ORDER — PHENYLEPHRINE 80 MCG/ML (10ML) SYRINGE FOR IV PUSH (FOR BLOOD PRESSURE SUPPORT)
PREFILLED_SYRINGE | INTRAVENOUS | Status: AC
  Filled 2022-07-08: qty 10

## 2022-07-08 MED ORDER — THROMBIN 20000 UNITS EX SOLR
CUTANEOUS | Status: AC
  Filled 2022-07-08: qty 20000

## 2022-07-08 MED ORDER — PROPOFOL 10 MG/ML IV BOLUS
INTRAVENOUS | Status: AC
  Filled 2022-07-08: qty 20

## 2022-07-08 MED ORDER — THROMBIN 5000 UNITS EX SOLR
CUTANEOUS | Status: AC
  Filled 2022-07-08: qty 5000

## 2022-07-08 MED ORDER — CHLORHEXIDINE GLUCONATE CLOTH 2 % EX PADS
6.0000 | MEDICATED_PAD | Freq: Once | CUTANEOUS | Status: AC
  Administered 2022-07-08: 6 via TOPICAL

## 2022-07-08 MED ORDER — DEXMEDETOMIDINE HCL IN NACL 80 MCG/20ML IV SOLN
INTRAVENOUS | Status: DC | PRN
  Administered 2022-07-08: 8 ug via BUCCAL

## 2022-07-08 MED ORDER — EPHEDRINE 5 MG/ML INJ
INTRAVENOUS | Status: AC
  Filled 2022-07-08: qty 5

## 2022-07-08 MED ORDER — SODIUM CHLORIDE 0.9 % IV SOLN: INTRAVENOUS | Status: DC

## 2022-07-08 MED ORDER — SODIUM CHLORIDE 0.9 % IV SOLN: INTRAVENOUS | Status: DC | PRN

## 2022-07-08 MED ORDER — ONDANSETRON HCL 4 MG/2ML IJ SOLN
INTRAMUSCULAR | Status: DC | PRN
  Administered 2022-07-08: 4 mg via INTRAVENOUS

## 2022-07-08 MED ORDER — ACETAMINOPHEN 10 MG/ML IV SOLN
INTRAVENOUS | Status: DC | PRN
  Administered 2022-07-08: 1000 mg via INTRAVENOUS

## 2022-07-08 MED ORDER — OXYCODONE HCL 5 MG/5ML PO SOLN: 5.0000 mg | Freq: Once | ORAL | Status: DC | PRN

## 2022-07-08 MED ORDER — LIDOCAINE-EPINEPHRINE 1 %-1:100000 IJ SOLN
INTRAMUSCULAR | Status: DC | PRN
  Administered 2022-07-08: 10 mL

## 2022-07-08 MED ORDER — MANNITOL 25 % IV SOLN
INTRAVENOUS | Status: DC | PRN
  Administered 2022-07-08: 25 g via INTRAVENOUS

## 2022-07-08 MED ORDER — THROMBIN 20000 UNITS EX SOLR
CUTANEOUS | Status: DC | PRN
  Administered 2022-07-08 (×2): 20 mL via TOPICAL

## 2022-07-08 MED ORDER — LIDOCAINE 2% (20 MG/ML) 5 ML SYRINGE
INTRAMUSCULAR | Status: AC
  Filled 2022-07-08: qty 5

## 2022-07-08 MED ORDER — 0.9 % SODIUM CHLORIDE (POUR BTL) OPTIME
TOPICAL | Status: DC | PRN
  Administered 2022-07-08: 3000 mL

## 2022-07-08 MED ORDER — PROPOFOL 10 MG/ML IV BOLUS
INTRAVENOUS | Status: DC | PRN
  Administered 2022-07-08: 50 mg via INTRAVENOUS
  Administered 2022-07-08: 150 mg via INTRAVENOUS

## 2022-07-08 MED ORDER — CEFAZOLIN SODIUM-DEXTROSE 2-4 GM/100ML-% IV SOLN
2.0000 g | INTRAVENOUS | Status: AC
  Administered 2022-07-08: 2 g via INTRAVENOUS
  Filled 2022-07-08: qty 100

## 2022-07-08 MED ORDER — MEPERIDINE HCL 25 MG/ML IJ SOLN: 6.2500 mg | INTRAMUSCULAR | Status: DC | PRN

## 2022-07-08 MED ORDER — DEXAMETHASONE SODIUM PHOSPHATE 10 MG/ML IJ SOLN
INTRAMUSCULAR | Status: AC
  Filled 2022-07-08: qty 1

## 2022-07-08 MED ORDER — FENTANYL CITRATE (PF) 250 MCG/5ML IJ SOLN
INTRAMUSCULAR | Status: AC
  Filled 2022-07-08: qty 5

## 2022-07-08 MED ORDER — CHLORHEXIDINE GLUCONATE 0.12 % MT SOLN
OROMUCOSAL | Status: AC
  Administered 2022-07-08: 15 mL via OROMUCOSAL
  Filled 2022-07-08: qty 15

## 2022-07-08 MED ORDER — BACITRACIN ZINC 500 UNIT/GM EX OINT
TOPICAL_OINTMENT | CUTANEOUS | Status: AC
  Filled 2022-07-08: qty 28.35

## 2022-07-08 MED ORDER — THROMBIN 5000 UNITS EX SOLR
OROMUCOSAL | Status: DC | PRN
  Administered 2022-07-08 (×2): 5 mL via TOPICAL

## 2022-07-08 MED ORDER — BACITRACIN ZINC 500 UNIT/GM EX OINT
TOPICAL_OINTMENT | CUTANEOUS | Status: DC | PRN
  Administered 2022-07-08: 1 via TOPICAL

## 2022-07-08 MED ORDER — MIDAZOLAM HCL 2 MG/2ML IJ SOLN: 0.5000 mg | Freq: Once | INTRAMUSCULAR | Status: DC | PRN

## 2022-07-08 MED ORDER — FENTANYL CITRATE (PF) 100 MCG/2ML IJ SOLN: 25.0000 ug | INTRAMUSCULAR | Status: DC | PRN

## 2022-07-08 MED ORDER — ROCURONIUM BROMIDE 10 MG/ML (PF) SYRINGE
PREFILLED_SYRINGE | INTRAVENOUS | Status: AC
  Filled 2022-07-08: qty 10

## 2022-07-08 MED ORDER — MICROFIBRILLAR COLL HEMOSTAT EX PADS
MEDICATED_PAD | CUTANEOUS | Status: DC | PRN
  Administered 2022-07-08: 1 via TOPICAL

## 2022-07-08 MED ORDER — DEXAMETHASONE SODIUM PHOSPHATE 10 MG/ML IJ SOLN
INTRAMUSCULAR | Status: DC | PRN
  Administered 2022-07-08: 10 mg via INTRAVENOUS

## 2022-07-08 MED ORDER — ORAL CARE MOUTH RINSE: 15.0000 mL | Freq: Once | OROMUCOSAL | Status: AC

## 2022-07-08 MED ORDER — CEFAZOLIN SODIUM-DEXTROSE 2-4 GM/100ML-% IV SOLN: 2.0000 g | INTRAVENOUS | Status: DC

## 2022-07-08 MED ORDER — PROMETHAZINE HCL 25 MG/ML IJ SOLN: 6.2500 mg | INTRAMUSCULAR | Status: DC | PRN

## 2022-07-08 MED ORDER — EPHEDRINE SULFATE (PRESSORS) 50 MG/ML IJ SOLN
INTRAMUSCULAR | Status: DC | PRN
  Administered 2022-07-08: 5 mg via INTRAVENOUS

## 2022-07-08 MED ORDER — CHLORHEXIDINE GLUCONATE 0.12 % MT SOLN: 15.0000 mL | Freq: Once | OROMUCOSAL | Status: AC

## 2022-07-08 MED ORDER — LIDOCAINE-EPINEPHRINE 1 %-1:100000 IJ SOLN
INTRAMUSCULAR | Status: AC
  Filled 2022-07-08: qty 1

## 2022-07-08 MED ORDER — SUCCINYLCHOLINE CHLORIDE 200 MG/10ML IV SOSY
PREFILLED_SYRINGE | INTRAVENOUS | Status: AC
  Filled 2022-07-08: qty 10

## 2022-07-08 MED ORDER — PHENYLEPHRINE HCL-NACL 20-0.9 MG/250ML-% IV SOLN
INTRAVENOUS | Status: DC | PRN
  Administered 2022-07-08: 40 ug/min via INTRAVENOUS

## 2022-07-08 MED ORDER — ROCURONIUM BROMIDE 100 MG/10ML IV SOLN
INTRAVENOUS | Status: DC | PRN
  Administered 2022-07-08: 20 mg via INTRAVENOUS
  Administered 2022-07-08 (×2): 40 mg via INTRAVENOUS
  Administered 2022-07-08: 60 mg via INTRAVENOUS

## 2022-07-08 MED ORDER — OXYCODONE HCL 5 MG PO TABS: 5.0000 mg | ORAL_TABLET | Freq: Once | ORAL | Status: DC | PRN

## 2022-07-08 MED ORDER — FENTANYL CITRATE (PF) 100 MCG/2ML IJ SOLN
INTRAMUSCULAR | Status: DC | PRN
  Administered 2022-07-08: 50 ug via INTRAVENOUS
  Administered 2022-07-08: 200 ug via INTRAVENOUS
  Administered 2022-07-08: 50 ug via INTRAVENOUS
  Administered 2022-07-08 (×2): 25 ug via INTRAVENOUS
  Administered 2022-07-08: 50 ug via INTRAVENOUS

## 2022-07-08 MED ORDER — ONDANSETRON HCL 4 MG/2ML IJ SOLN
INTRAMUSCULAR | Status: AC
  Filled 2022-07-08: qty 2

## 2022-07-08 MED ORDER — SUGAMMADEX SODIUM 200 MG/2ML IV SOLN
INTRAVENOUS | Status: DC | PRN
  Administered 2022-07-08: 300 mg via INTRAVENOUS

## 2022-07-08 SURGICAL SUPPLY — 105 items
BAG COUNTER SPONGE SURGICOUNT (BAG) ×1 IMPLANT
BAND RUBBER #18 3X1/16 STRL (MISCELLANEOUS) IMPLANT
BENZOIN TINCTURE PRP APPL 2/3 (GAUZE/BANDAGES/DRESSINGS) IMPLANT
BLADE CLIPPER SURG (BLADE) ×1 IMPLANT
BLADE SAW GIGLI 16 STRL (MISCELLANEOUS) IMPLANT
BLADE SURG 11 STRL SS (BLADE) ×1 IMPLANT
BLADE SURG 15 STRL LF DISP TIS (BLADE) IMPLANT
BLADE SURG 15 STRL SS (BLADE)
BNDG GAUZE DERMACEA FLUFF 4 (GAUZE/BANDAGES/DRESSINGS) IMPLANT
BNDG STRETCH 4X75 STRL LF (GAUZE/BANDAGES/DRESSINGS) IMPLANT
BUR ACORN 9.0 PRECISION (BURR) ×1 IMPLANT
BUR ROUND FLUTED 4 SOFT TCH (BURR) IMPLANT
BUR SPIRAL ROUTER 2.3 (BUR) ×1 IMPLANT
BUR TAPERED ROUTER 3.0 (BURR) IMPLANT
CANISTER SUCT 3000ML PPV (MISCELLANEOUS) ×2 IMPLANT
CASSETTE SUCT IRRIG SONOPET IQ (MISCELLANEOUS) IMPLANT
CATH VENTRIC 35X38 W/TROCAR LG (CATHETERS) IMPLANT
CLIP VESOCCLUDE MED 6/CT (CLIP) IMPLANT
CNTNR URN SCR LID CUP LEK RST (MISCELLANEOUS) ×1 IMPLANT
CONT SPEC 4OZ STRL OR WHT (MISCELLANEOUS) ×2
COVER BURR HOLE 14 (Orthopedic Implant) IMPLANT
COVER MAYO STAND STRL (DRAPES) IMPLANT
COVERAGE SUPPORT O-ARM STEALTH (MISCELLANEOUS) ×1 IMPLANT
DRAIN SUBARACHNOID (WOUND CARE) IMPLANT
DRAPE 3/4 80X56 (DRAPES) ×1 IMPLANT
DRAPE HALF SHEET 40X57 (DRAPES) ×1 IMPLANT
DRAPE MICROSCOPE SLANT 54X150 (MISCELLANEOUS) IMPLANT
DRAPE NEUROLOGICAL W/INCISE (DRAPES) ×1 IMPLANT
DRAPE STERI IOBAN 125X83 (DRAPES) IMPLANT
DRAPE SURG 17X23 STRL (DRAPES) IMPLANT
DRAPE WARM FLUID 44X44 (DRAPES) ×1 IMPLANT
DRSG ADAPTIC 3X8 NADH LF (GAUZE/BANDAGES/DRESSINGS) IMPLANT
DRSG AQUACEL AG ADV 3.5X 6 (GAUZE/BANDAGES/DRESSINGS) IMPLANT
DRSG TELFA 3X8 NADH STRL (GAUZE/BANDAGES/DRESSINGS) IMPLANT
DURAPREP 6ML APPLICATOR 50/CS (WOUND CARE) ×1 IMPLANT
ELECT COATED BLADE 2.86 ST (ELECTRODE) ×1 IMPLANT
ELECT REM PT RETURN 9FT ADLT (ELECTROSURGICAL) ×1
ELECTRODE REM PT RTRN 9FT ADLT (ELECTROSURGICAL) ×1 IMPLANT
EVACUATOR 1/8 PVC DRAIN (DRAIN) IMPLANT
EVACUATOR SILICONE 100CC (DRAIN) IMPLANT
FEE COVERAGE SUPPORT O-ARM (MISCELLANEOUS) ×1 IMPLANT
FEE SRVC MNAV LABOR AFTER HRS (MISCELLANEOUS) IMPLANT
FORCEPS BIPO MALIS IRRIG 9X1.5 (NEUROSURGERY SUPPLIES) ×1 IMPLANT
GAUZE 4X4 16PLY ~~LOC~~+RFID DBL (SPONGE) IMPLANT
GAUZE SPONGE 4X4 12PLY STRL (GAUZE/BANDAGES/DRESSINGS) IMPLANT
GAUZE XEROFORM 1X8 LF (GAUZE/BANDAGES/DRESSINGS) IMPLANT
GLOVE BIO SURGEON STRL SZ7.5 (GLOVE) ×1 IMPLANT
GLOVE BIOGEL PI IND STRL 7.5 (GLOVE) ×1 IMPLANT
GLOVE ECLIPSE 7.5 STRL STRAW (GLOVE) IMPLANT
GLOVE EXAM NITRILE LRG STRL (GLOVE) IMPLANT
GLOVE EXAM NITRILE XL STR (GLOVE) IMPLANT
GLOVE SURG ENC MOIS LTX SZ8 (GLOVE) ×2 IMPLANT
GLOVE SURG UNDER POLY LF SZ8.5 (GLOVE) ×2 IMPLANT
GOWN STRL REUS W/ TWL LRG LVL3 (GOWN DISPOSABLE) ×2 IMPLANT
GOWN STRL REUS W/ TWL XL LVL3 (GOWN DISPOSABLE) ×2 IMPLANT
GOWN STRL REUS W/TWL 2XL LVL3 (GOWN DISPOSABLE) IMPLANT
GOWN STRL REUS W/TWL LRG LVL3 (GOWN DISPOSABLE)
GOWN STRL REUS W/TWL XL LVL3 (GOWN DISPOSABLE) ×3
GRAFT DURAGEN MATRIX 3WX3L (Graft) ×1 IMPLANT
GRAFT DURAGEN MATRIX 3X3 SNGL (Graft) IMPLANT
HEMOSTAT POWDER KIT SURGIFOAM (HEMOSTASIS) ×1 IMPLANT
HEMOSTAT SURGICEL 2X14 (HEMOSTASIS) ×1 IMPLANT
HEMOSTAT SURGICEL 2X4 FIBR (HEMOSTASIS) IMPLANT
HOOK DURA 1/2IN (MISCELLANEOUS) ×1 IMPLANT
HOOK RETRACTION 12 ELAST STAY (MISCELLANEOUS) IMPLANT
IV NS 1000ML (IV SOLUTION) ×1
IV NS 1000ML BAXH (IV SOLUTION) ×1 IMPLANT
KIT BASIN OR (CUSTOM PROCEDURE TRAY) ×1 IMPLANT
KIT DRAIN CSF ACCUDRAIN (MISCELLANEOUS) IMPLANT
KIT TURNOVER KIT B (KITS) ×1 IMPLANT
MARKER SPHERE PSV REFLC NDI (MISCELLANEOUS) ×3 IMPLANT
NDL SPNL 18GX3.5 QUINCKE PK (NEEDLE) IMPLANT
NEEDLE HYPO 22GX1.5 SAFETY (NEEDLE) ×1 IMPLANT
NEEDLE SPNL 18GX3.5 QUINCKE PK (NEEDLE) IMPLANT
NS IRRIG 1000ML POUR BTL (IV SOLUTION) ×3 IMPLANT
PACK BATTERY CMF DISP FOR DVR (ORTHOPEDIC DISPOSABLE SUPPLIES) IMPLANT
PACK CRANIOTOMY CUSTOM (CUSTOM PROCEDURE TRAY) ×1 IMPLANT
PATTIES SURGICAL .25X.25 (GAUZE/BANDAGES/DRESSINGS) IMPLANT
PATTIES SURGICAL .5 X.5 (GAUZE/BANDAGES/DRESSINGS) IMPLANT
PATTIES SURGICAL .5 X3 (DISPOSABLE) IMPLANT
PATTIES SURGICAL 1/4 X 3 (GAUZE/BANDAGES/DRESSINGS) IMPLANT
PATTIES SURGICAL 1X1 (DISPOSABLE) IMPLANT
PIN MAYFIELD SKULL DISP (PIN) ×1 IMPLANT
SCREW UNIII AXS SD 1.5X4 (Screw) IMPLANT
SERVICE MNAV LABOR AFTER HRS (MISCELLANEOUS) ×3 IMPLANT
SET TUBING IRRIGATION DISP (TUBING) ×1 IMPLANT
SPIKE FLUID TRANSFER (MISCELLANEOUS) ×1 IMPLANT
SPONGE NEURO XRAY DETECT 1X3 (DISPOSABLE) IMPLANT
SPONGE SURGIFOAM ABS GEL 100 (HEMOSTASIS) ×1 IMPLANT
STAPLER VISISTAT 35W (STAPLE) ×1 IMPLANT
STOCKINETTE STERILE 6X72 (MISCELLANEOUS) IMPLANT
SUT ETHILON 3 0 FSL (SUTURE) IMPLANT
SUT ETHILON 3 0 PS 1 (SUTURE) IMPLANT
SUT MNCRL AB 3-0 PS2 18 (SUTURE) IMPLANT
SUT NURALON 4 0 TR CR/8 (SUTURE) ×3 IMPLANT
SUT SILK 0 TIES 10X30 (SUTURE) IMPLANT
SUT VIC AB 2-0 CP2 18 (SUTURE) ×1 IMPLANT
SUT VICRYL RAPIDE 3 0 (SUTURE) IMPLANT
TIP TISSUE SONOPET IQ STD 12 (TIP) IMPLANT
TOWEL GREEN STERILE (TOWEL DISPOSABLE) ×1 IMPLANT
TOWEL GREEN STERILE FF (TOWEL DISPOSABLE) ×1 IMPLANT
TRAY FOLEY MTR SLVR 16FR STAT (SET/KITS/TRAYS/PACK) ×1 IMPLANT
TUBE CONNECTING 12X1/4 (SUCTIONS) ×1 IMPLANT
UNDERPAD 30X36 HEAVY ABSORB (UNDERPADS AND DIAPERS) ×1 IMPLANT
WATER STERILE IRR 1000ML POUR (IV SOLUTION) ×1 IMPLANT

## 2022-07-08 NOTE — Op Note (Signed)
Procedure(s): PARIETAL CRANIOTOMY TUMOR EXCISION APPLICATION OF CRANIAL NAVIGATION Procedure Note  Jenna Powell female 71 y.o. 07/08/2022  Procedure(s) and Anesthesia Type:    * PARIETAL CRANIOTOMY TUMOR EXCISION - General    * APPLICATION OF CRANIAL NAVIGATION - General  Surgeon(s) and Role:    Marcello Moores, Dorcas Carrow, MD - Primary   Indications: This is a 71 year old woman with dementia, diabetes who presented to the hospital with seizures.  She was found to have a large left parietal parasagittal meningioma that had significantly grown in size over the past 2 years, with associated vasogenic edema and mass effect on the parietal lobe.  I do long discussion with the patient's family.  Given the large size of the tumor, mass effect and irritation on the brain likely inducing seizures, and the fairly rapid growth, surgical resection was offered as an option for both diagnostic and therapeutic benefit.  I discussed the general technique of surgery, as well as risks, benefits, alternatives, and expected convalescence. Risks discussed included, but were not limited to bleeding, pain, infection, scar, stroke, seizure, neurologic deficit, recurrence, death. Informed consent was obtained.    Surgeon: Vallarie Mare   Assistants: none  Anesthesia: General endotracheal anesthesia   Procedure Detail  1.  Left parietal craniotomy for resection of parasagittal meningioma 2.  Use of neuronavigation  The patient brought the operating room.  General anesthesia was induced and patient was intubated by the anesthesia service.  After appropriate lines and monitors were placed, patient was positioned in the lateral decubitus position with the right side up to allow gravity retraction of the left side of the brain.  The patient was placed in the [place] head holder and affixed to the bed.  Preoperative thin MRI was reconstructed 3D image.  This was used to perform surface match registration with the  Medtronic Stealth system.  A curvilinear incision was planned on the left side of midline.  The scalp was clipped, preprepped with alcohol and prepped and draped in sterile fashion.  1% lidocaine with epinephrine was injected in the skin.  Preoperative antibiotics, dexamethasone, and mannitol were given.  Incision was made with a 10 blade and the galea was opened sharply.  Periosteum was dissected off the skull.  The sagittal suture and the lambdoid suture were identified and confirmed accuracy of the navigation.  High-speed drill was used to perform bur holes.  Bur holes over the parietal lobe revealed the dura was calcified and completely fused to the skull and inseparable from the skull.  I had planned initially planned a craniotomy going across the midline to the right parietal skull but given the findings of the bur hole,s I did not think it was safe to fully expose the sagittal sinus given the likelihood that the dura was calcified and fused to the skull.  As such I decided to place a bur hole just lateral to the sinus on the left side.  Again, the dura was found to be fused to the skull.  Craniotome was then used to perform a craniotomy.  The tumor was identified at the surface.  The pia arachnoid was sharply dissected from the lateral aspect of the tumor.  The tumor capsule was coagulated.  The capsule was sharply opened and the tumor was debulked with the sonopet.  This allowed infolding of the capsule with further dissection from the brain.  Over the surface of the tumor, there was a thrombosed bridging vein noted.  This was coagulated and cut.  The tumor was then dissected from the lateral wall of the sagittal sinus and feeders from the falx were coagulated and cut sharply and the tumor capsule was peeled from the falx.  There were patches where the tumor was inseparable from the lateral wall of the sagittal sinus and so tiny layer of tumor remnant was left and coagulated well.  Circumferential dissection  was performed and the tumor was removed.  The cavity was closely inspected and no remaining tumor was able to be visualized, other than coagulated remnants on the sagittal sinus wall. Meticulous hemostasis was obtained.  DuraGen plus onlay was placed.  Part of the inner table of the skull appeared involved with tumor so this was drilled away.  Stryker cranial plating system was used to replace the bone flap.  The wound was irrigated thoroughly.  The scalp was closed with 2-0 Vicryl stitches in bariatric fashion.  The skin was closed with staples and a sterile dressing was placed.  Patient was then removed from the [place]field head holder and flipped supine with the expectation of extubation in the anesthesia service.  All counts were correct at the end of surgery.  No complications were noted.   Findings: Likely meningioma with involvement of the lateral wall of the sagittal sinus.  Estimated Blood Loss:  less than 100 mL         Drains: None         Total IV Fluids: See anesthesia records  Blood Given: none          Specimens: Parasagittal extra-axial mass         Implants: Stryker cranial plating system        Complications:  * No complications entered in OR log *         Disposition: PACU - hemodynamically stable.         Condition: stable

## 2022-07-08 NOTE — Anesthesia Procedure Notes (Signed)
Procedure Name: Intubation Date/Time: 07/08/2022 8:16 PM  Performed by: Kaylah Chiasson T, CRNAPre-anesthesia Checklist: Patient identified, Emergency Drugs available, Suction available and Patient being monitored Patient Re-evaluated:Patient Re-evaluated prior to induction Oxygen Delivery Method: Circle system utilized Preoxygenation: Pre-oxygenation with 100% oxygen Induction Type: IV induction Ventilation: Mask ventilation without difficulty Laryngoscope Size: Mac and 4 Grade View: Grade I Tube type: Oral Tube size: 7.0 mm Number of attempts: 1 Airway Equipment and Method: Stylet and Oral airway Placement Confirmation: ETT inserted through vocal cords under direct vision, positive ETCO2 and breath sounds checked- equal and bilateral Secured at: 21 cm Tube secured with: Tape Dental Injury: Teeth and Oropharynx as per pre-operative assessment

## 2022-07-08 NOTE — Anesthesia Procedure Notes (Signed)
Arterial Line Insertion Start/End12/12/2021 7:50 PM, 07/08/2022 8:02 PM Performed by: Annye Asa, MD, Rande Brunt, CRNA, anesthesiologist  Patient location: OR. Preanesthetic checklist: patient identified, IV checked, site marked, risks and benefits discussed, surgical consent, monitors and equipment checked, pre-op evaluation, timeout performed and anesthesia consent Patient sedated Left, radial was placed Catheter size: 20 G Hand hygiene performed  and Seldinger technique used Allen's test indicative of satisfactory collateral circulation Attempts: 2 Procedure performed using ultrasound guided technique. Ultrasound Notes:anatomy identified, needle tip was noted to be adjacent to the nerve/plexus identified, no ultrasound evidence of intravascular and/or intraneural injection and image(s) printed for medical record Following insertion, dressing applied and Biopatch. Post procedure assessment: normal  Post procedure complications: second provider assisted and unsuccessful attempts. Patient tolerated the procedure well with no immediate complications.

## 2022-07-08 NOTE — Anesthesia Preprocedure Evaluation (Addendum)
Anesthesia Evaluation  Patient identified by MRN, date of birth, ID band Patient awake    Reviewed: Allergy & Precautions, NPO status , Patient's Chart, lab work & pertinent test results, reviewed documented beta blocker date and time   History of Anesthesia Complications Negative for: history of anesthetic complications  Airway Mallampati: II  TM Distance: >3 FB Neck ROM: Full    Dental  (+) Edentulous Upper, Edentulous Lower   Pulmonary neg pulmonary ROS   breath sounds clear to auscultation       Cardiovascular hypertension, Pt. on medications and Pt. on home beta blockers (-) angina  Rhythm:Regular Rate:Normal     Neuro/Psych Seizures -, Well Controlled,     Bipolar Disorder  Dementia meningioma    GI/Hepatic Neg liver ROS,GERD  Medicated and Controlled,,  Endo/Other  diabetes (glu 90)  obese  Renal/GU negative Renal ROS     Musculoskeletal   Abdominal  (+) + obese  Peds  Hematology Hb 11.8, plt 187k   Anesthesia Other Findings   Reproductive/Obstetrics                             Anesthesia Physical Anesthesia Plan  ASA: 3  Anesthesia Plan: General   Post-op Pain Management: Ofirmev IV (intra-op)*   Induction: Intravenous  PONV Risk Score and Plan: 3 and Ondansetron, Dexamethasone and Treatment may vary due to age or medical condition  Airway Management Planned: Oral ETT  Additional Equipment: Arterial line  Intra-op Plan:   Post-operative Plan: Extubation in OR  Informed Consent: I have reviewed the patients History and Physical, chart, labs and discussed the procedure including the risks, benefits and alternatives for the proposed anesthesia with the patient or authorized representative who has indicated his/her understanding and acceptance.     Dental advisory given and Consent reviewed with POA  Plan Discussed with: CRNA and Surgeon  Anesthesia Plan Comments:  (Discussed with patient and her son)        Anesthesia Quick Evaluation

## 2022-07-08 NOTE — Progress Notes (Signed)
Subjective: Patient reports no headaches.  Objective: Vital signs in last 24 hours: Temp:  [98.4 F (36.9 C)-98.9 F (37.2 C)] 98.9 F (37.2 C) (12/05 1724) Pulse Rate:  [58-69] 61 (12/05 1724) Resp:  [16-18] 16 (12/05 1724) BP: (93-121)/(42-70) 97/54 (12/05 1724) SpO2:  [98 %-100 %] 100 % (12/05 1724)  Intake/Output from previous day: 12/04 0701 - 12/05 0700 In: 183.6 [IV Piggyback:183.6] Out: -  Intake/Output this shift: No intake/output data recorded.  Patient drowsy, eyes open to voice.  oriented to name, FC x4 with no drift  Lab Results: Recent Labs    07/07/22 0736  WBC 4.9  HGB 11.8*  HCT 37.1  PLT 187   BMET Recent Labs    07/07/22 0736 07/08/22 0650  NA 139  --   K 4.5  --   CL 110  --   CO2 20*  --   GLUCOSE 104*  --   BUN 16  --   CREATININE 1.05* 1.02*  CALCIUM 8.9  --     Studies/Results: CT DBS HEAD W/O CONTRAST (1MM)  Result Date: 07/08/2022 CLINICAL DATA:  Brain mass.  CT for surgical planning. EXAM: CT HEAD WITHOUT CONTRAST TECHNIQUE: Contiguous axial images were obtained from the base of the skull through the vertex without intravenous contrast. RADIATION DOSE REDUCTION: This exam was performed according to the departmental dose-optimization program which includes automated exposure control, adjustment of the mA and/or kV according to patient size and/or use of iterative reconstruction technique. COMPARISON:  Head MRI 07/03/2022 and head CT 07/01/2022 FINDINGS: Brain: An extra-axial mass along the posterior aspect of the falx in the left parietal region is unchanged from the recent prior examinations, and associated mild vasogenic edema in the left parietal white matter is also unchanged. There is no evidence of an acute infarct, intracranial hemorrhage, midline shift, or extra-axial fluid collection. The ventricles are normal in size. There is a partially empty sella. Vascular: No hyperdense vessel. Skull: No fracture or suspicious osseous lesion.  Sinuses/Orbits: Minimal mucosal thickening in the paranasal sinuses. No significant mastoid fluid. Unremarkable orbits. Other: None. IMPRESSION: CT for surgical planning. 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 Electronically Signed   By: Jenna Powell M.D.   On: 07/08/2022 09:40    Assessment/Plan: [name] with seizures, meningioma with significant growth - I had a long discussion with the patient's son at the bedside as well as other family by phone.  I explained her diagnosis, with the recommendation for resection based on rapidity of growth and mass effect and irritation of brain. I discussed the general technique of surgery, as well as risks, benefits, alternatives, and expected convalescence.  Risks discussed included, but were not limited to bleeding, pain, infection, scar, stroke, seizure, neurologic deficit, recurrence, death.  Informed consent was obtained. She currently lives in assisted living-- she likely will require SNF posotoperatively.   Jenna Powell 07/08/2022, 6:08 PM

## 2022-07-08 NOTE — Progress Notes (Signed)
PROGRESS NOTE    Jenna Powell  OVF:[id number] DOB: May 22, 1951 DOA: 07/01/2022 PCP: Merryl Hacker, No   Brief Narrative:  Jenna Powell is a 71 y.o. female with medical history significant of bipolar disorder, dementia, hypertension, diabetes who was found down at her facility.  Initial history obtained from facility and chart review, patient's mental status appears to be limited at baseline.  Patient episodes of vomiting with pinpoint pupils per facility found to be minimally responsive.  In the ED patient's mental status continued to wax and wane minimally improving, admitted for seizure evaluation by hospitalist team, neurology consulting.  Neurosurgery consulted due to mass as noted on CT.  Assessment & Plan:   Principal Problem:   Acute encephalopathy Active Problems:   Dementia without behavioral disturbance (HCC)   Bipolar disorder (HCC)   DM (diabetes mellitus), type 2 (HCC)   Essential hypertension   Meningioma (HCC)   Vasogenic edema (HCC)   Seizure ([place])  Questionable acute onset seizure Acute encephalopathy, improving Rule out polypharmacy -Likely provoked seizure secondary to meningioma as below -continue Keppra per neurology -Concern for polypharmacy on multiple sedating medications - these have been held as below -Hold clozapine, donepezil, risperidone, sertraline, trazodone, lithium, Cogentin given concern for polypharmacy   Meningioma -CT head remarkable for 3 x 3.5 cm mass concerning for meningioma with questionable vasogenic edema  - MRI indicates 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 -Neurology and neurosurgery consulted, discussed with family plan for surgery today 07/08/2022 -likely transition to neuro ICU over the next 24 hours for monitoring postoperatively   Bipolar disorder Dementia - As above, holding home clozapine, donepezil, lithium, risperidone, sertraline, trazodone,  Cogentin -Patient symptoms remain well-controlled at this time, would likely need to resume these medications postoperatively pending patient's mental status.  Again there is concern for polypharmacy given above findings although provoked seizure in the setting of meningioma is also likely contributing.   Hypertension - Continue metoprolol   Diabetes - SSI  Contaminated blood culture - 1/2 positive for aerococcus - no signs/symptoms of infection, follow   DVT prophylaxis:      Lovenox Code Status:              Full Family Communication: Son updated by phone  Status is: Inpatient  Dispo: The patient is from: Facility              Anticipated d/c is to: Same              Anticipated d/c date is: 48 to 72 hours pending clinical/surgical  course              Patient currently not medically stable for discharge  Consultants:  Neurosurgery, neurology  Procedures:  None yet  Antimicrobials:  Not indicated  Subjective: No acute issues or events overnight, patient's mental status appears to be back to baseline(pleasantly confused )off her multiple medications as above.  Review of system generally unremarkable denies chest pain shortness of breath nausea vomiting diarrhea constipation.  Objective: Vitals:   07/07/22 2024 07/07/22 2352 07/08/22 0539 07/08/22 0541  BP: (!) 96/45 121/70 (!) 93/58 (!) 99/44  Pulse: 66 (!) 58 60   Resp: '18 16 18   '$ Temp: 98.9 F (37.2 C) 98.4 F (36.9 C) 98.4 F (36.9 C)   TempSrc: Oral Oral Oral   SpO2: 99% 100% 99%   Height:        Intake/Output Summary (Last 24 hours) at  07/08/2022 3329 Last data filed at 07/07/2022 1913 Gross per 24 hour  Intake 183.59 ml  Output --  Net 183.59 ml    There were no vitals filed for this visit.  Examination:  General:  Pleasantly resting in bed, No acute distress. HEENT:  Normocephalic atraumatic.  Sclerae nonicteric, noninjected.  Extraocular movements intact bilaterally. Neck:  Without mass or  deformity.  Trachea is midline. Lungs:  Clear to auscultate bilaterally without rhonchi, wheeze, or rales. Heart:  Regular rate and rhythm.  Without murmurs, rubs, or gallops. Abdomen:  Soft, nontender, nondistended.  Without guarding or rebound. Extremities: Without cyanosis, clubbing, or obvious deformity. Skin:  Warm and dry, no erythema  Data Reviewed: I have personally reviewed following labs and imaging studies  CBC: Recent Labs  Lab 07/01/22 1225 07/01/22 1237 07/02/22 0515 07/07/22 0736  WBC 5.8  --  9.2 4.9  NEUTROABS 4.5  --   --   --   HGB 12.5 13.3 13.5 11.8*  HCT 41.3 39.0 43.4 37.1  MCV 90.6  --  88.4 86.7  PLT 134*  --  194 518    Basic Metabolic Panel: Recent Labs  Lab 07/01/22 1237 07/02/22 0515 07/07/22 0736  NA 140 137 139  K 4.8 4.6 4.5  CL  --  105 110  CO2  --  23 20*  GLUCOSE  --  117* 104*  BUN  --  12 16  CREATININE  --  1.00 1.05*  CALCIUM  --  9.7 8.9    GFR: CrCl cannot be calculated (Unknown ideal weight.). Liver Function Tests: Recent Labs  Lab 07/02/22 0515  AST 19  ALT 21  ALKPHOS 89  BILITOT 0.4  PROT 6.5  ALBUMIN 3.7    No results for input(s): "LIPASE", "AMYLASE" in the last 168 hours. Recent Labs  Lab 07/01/22 1515  AMMONIA 48*    Coagulation Profile: No results for input(s): "INR", "PROTIME" in the last 168 hours. Cardiac Enzymes: No results for input(s): "CKTOTAL", "CKMB", "CKMBINDEX", "TROPONINI" in the last 168 hours. BNP (last 3 results) No results for input(s): "PROBNP" in the last 8760 hours. HbA1C: No results for input(s): "HGBA1C" in the last 72 hours. CBG: Recent Labs  Lab 07/07/22 0647 07/07/22 1209 07/07/22 1634 07/07/22 2138 07/08/22 0613  GLUCAP 105* 83 99 101* 97    Lipid Profile: No results for input(s): "CHOL", "HDL", "LDLCALC", "TRIG", "CHOLHDL", "LDLDIRECT" in the last 72 hours. Thyroid Function Tests: No results for input(s): "TSH", "T4TOTAL", "FREET4", "T3FREE", "THYROIDAB" in  the last 72 hours.  Anemia Panel: No results for input(s): "VITAMINB12", "FOLATE", "FERRITIN", "TIBC", "IRON", "RETICCTPCT" in the last 72 hours. Sepsis Labs: Recent Labs  Lab 07/01/22 1225  LATICACIDVEN 5.4*     Recent Results (from the past 240 hour(s))  Blood Culture (Routine X 2)     Status: None   Collection Time: 07/01/22 12:21 PM   Specimen: BLOOD  Result Value Ref Range Status   Specimen Description BLOOD SITE NOT SPECIFIED  Final   Special Requests   Final    BOTTLES DRAWN AEROBIC AND ANAEROBIC Blood Culture results may not be optimal due to an inadequate volume of blood received in culture bottles   Culture   Final    NO GROWTH 5 DAYS Performed at [facility], [place] [address] [place], [place] [address]    Report Status 07/06/2022 FINAL  Final  Blood Culture (Routine X 2)     Status: Abnormal   Collection Time: 07/01/22  12:26 PM   Specimen: BLOOD  Result Value Ref Range Status   Specimen Description BLOOD SITE NOT SPECIFIED  Final   Special Requests (A)  Final    AEROCOCCUS SPECIES Blood Culture results may not be optimal due to an inadequate volume of blood received in culture bottles   Culture   Final    NO GROWTH 5 DAYS Performed at [facility], [place] [address] [place], [place] [address]    Report Status 07/06/2022 FINAL  Final  Resp Panel by RT-PCR (Flu A&B, Covid) Anterior Nasal Swab     Status: None   Collection Time: 07/01/22  3:15 PM   Specimen: Anterior Nasal Swab  Result Value Ref Range Status   SARS Coronavirus 2 by RT PCR NEGATIVE NEGATIVE Final    Comment: (NOTE) SARS-CoV-2 target nucleic acids are NOT DETECTED.  The SARS-CoV-2 RNA is generally detectable in upper respiratory specimens during the acute phase of infection. The lowest concentration of SARS-CoV-2 viral copies this assay can detect is 138 copies/mL. A negative result does not preclude SARS-Cov-2 infection and should not be used as the sole basis for treatment or other  patient management decisions. A negative result may occur with  improper specimen collection/handling, submission of specimen other than nasopharyngeal swab, presence of viral mutation(s) within the areas targeted by this assay, and inadequate number of viral copies(<138 copies/mL). A negative result must be combined with clinical observations, patient history, and epidemiological information. The expected result is Negative.  Fact Sheet for Patients:  EntrepreneurPulse.com.au  Fact Sheet for Healthcare Providers:  IncredibleEmployment.be  This test is no t yet approved or cleared by the [place] FDA and  has been authorized for detection and/or diagnosis of SARS-CoV-2 by FDA under an Emergency Use Authorization (EUA). This EUA will remain  in effect (meaning this test can be used) for the duration of the COVID-19 declaration under Section 564(b)(1) of the Act, 21 U.S.C.section 360bbb-3(b)(1), unless the authorization is terminated  or revoked sooner.       Influenza A by PCR NEGATIVE NEGATIVE Final   Influenza B by PCR NEGATIVE NEGATIVE Final    Comment: (NOTE) The Xpert Xpress SARS-CoV-2/FLU/RSV plus assay is intended as an aid in the diagnosis of influenza from Nasopharyngeal swab specimens and should not be used as a sole basis for treatment. Nasal washings and aspirates are unacceptable for Xpert Xpress SARS-CoV-2/FLU/RSV testing.  Fact Sheet for Patients: EntrepreneurPulse.com.au  Fact Sheet for Healthcare Providers: IncredibleEmployment.be  This test is not yet approved or cleared by the [place] FDA and has been authorized for detection and/or diagnosis of SARS-CoV-2 by FDA under an Emergency Use Authorization (EUA). This EUA will remain in effect (meaning this test can be used) for the duration of the COVID-19 declaration under Section 564(b)(1) of the Act, 21 U.S.C. section  360bbb-3(b)(1), unless the authorization is terminated or revoked.  Performed at [facility], [place] [address] [place], [place] [address]   Surgical PCR screen     Status: None   Collection Time: 07/08/22  3:40 AM   Specimen: Nasal Mucosa; Nasal Swab  Result Value Ref Range Status   MRSA, PCR NEGATIVE NEGATIVE Final   Staphylococcus aureus NEGATIVE NEGATIVE Final    Comment: (NOTE) The Xpert SA Assay (FDA approved for NASAL specimens in patients 16 years of age and older), is one component of a comprehensive surveillance program. It is not intended to diagnose infection nor to guide or monitor treatment. Performed at  [facility], [place] [address] [place], [place] [address]          Radiology Studies: No results found.   Scheduled Meds:  benztropine  1 mg Oral BID   enoxaparin (LOVENOX) injection  40 mg Subcutaneous Q24H   insulin aspart  0-9 Units Subcutaneous TID WC   lithium carbonate  150 mg Oral Q breakfast   lithium carbonate  300 mg Oral Q supper   mirabegron ER  50 mg Oral Daily   mupirocin ointment  1 Application Nasal BID   pravastatin  20 mg Oral QHS   sodium chloride flush  3 mL Intravenous Q12H   Continuous Infusions:  sodium chloride Stopped (07/04/22 0017)   0.9 % NaCl with KCl 20 mEq / L 100 mL/hr at 07/08/22 0240   levETIRAcetam 500 mg (07/08/22 0646)     LOS: 6 days   Time spent: 69mn  Kyren Vaux C Eleanor Gatliff, DO [facility]  If 7PM-7AM, please contact night-coverage [email or web address]  07/08/2022, 7:21 AM

## 2022-07-09 ENCOUNTER — Inpatient Hospital Stay (HOSPITAL_COMMUNITY): Payer: Medicare Other

## 2022-07-09 ENCOUNTER — Other Ambulatory Visit: Payer: Self-pay | Admitting: Radiation Therapy

## 2022-07-09 DIAGNOSIS — G9340 Encephalopathy, unspecified: Secondary | ICD-10-CM | POA: Diagnosis not present

## 2022-07-09 LAB — CBC WITH DIFFERENTIAL/PLATELET
Abs Immature Granulocytes: 0.1 10*3/uL — ABNORMAL HIGH (ref 0.00–0.07)
Basophils Absolute: 0 10*3/uL (ref 0.0–0.1)
Basophils Relative: 0 %
Eosinophils Absolute: 0 10*3/uL (ref 0.0–0.5)
Eosinophils Relative: 0 %
HCT: 35.3 % — ABNORMAL LOW (ref 36.0–46.0)
Hemoglobin: 11.3 g/dL — ABNORMAL LOW (ref 12.0–15.0)
Immature Granulocytes: 2 %
Lymphocytes Relative: 8 %
Lymphs Abs: 0.5 10*3/uL — ABNORMAL LOW (ref 0.7–4.0)
MCH: 28 pg (ref 26.0–34.0)
MCHC: 32 g/dL (ref 30.0–36.0)
MCV: 87.4 fL (ref 80.0–100.0)
Monocytes Absolute: 0.1 10*3/uL (ref 0.1–1.0)
Monocytes Relative: 2 %
Neutro Abs: 5.4 10*3/uL (ref 1.7–7.7)
Neutrophils Relative %: 88 %
Platelets: 192 10*3/uL (ref 150–400)
RBC: 4.04 MIL/uL (ref 3.87–5.11)
RDW: 12.5 % (ref 11.5–15.5)
WBC: 6.1 10*3/uL (ref 4.0–10.5)
nRBC: 0 % (ref 0.0–0.2)

## 2022-07-09 LAB — GLUCOSE, CAPILLARY
Glucose-Capillary: 103 mg/dL — ABNORMAL HIGH (ref 70–99)
Glucose-Capillary: 114 mg/dL — ABNORMAL HIGH (ref 70–99)
Glucose-Capillary: 123 mg/dL — ABNORMAL HIGH (ref 70–99)
Glucose-Capillary: 125 mg/dL — ABNORMAL HIGH (ref 70–99)
Glucose-Capillary: 150 mg/dL — ABNORMAL HIGH (ref 70–99)
Glucose-Capillary: 161 mg/dL — ABNORMAL HIGH (ref 70–99)

## 2022-07-09 LAB — BASIC METABOLIC PANEL
Anion gap: 6 (ref 5–15)
BUN: 12 mg/dL (ref 8–23)
CO2: 20 mmol/L — ABNORMAL LOW (ref 22–32)
Calcium: 8 mg/dL — ABNORMAL LOW (ref 8.9–10.3)
Chloride: 113 mmol/L — ABNORMAL HIGH (ref 98–111)
Creatinine, Ser: 0.97 mg/dL (ref 0.44–1.00)
GFR, Estimated: >60 mL/min (ref >60)
Glucose, Bld: 157 mg/dL — ABNORMAL HIGH (ref 70–99)
Potassium: 4.5 mmol/L (ref 3.5–5.1)
Sodium: 139 mmol/L (ref 135–145)

## 2022-07-09 MED ORDER — HYDROCODONE-ACETAMINOPHEN 5-325 MG PO TABS
1.0000 | ORAL_TABLET | ORAL | Status: DC | PRN
  Administered 2022-07-09 – 2022-07-10 (×2): 1 via ORAL
  Filled 2022-07-09 (×2): qty 1

## 2022-07-09 MED ORDER — DONEPEZIL HCL 10 MG PO TABS
10.0000 mg | ORAL_TABLET | Freq: Every day | ORAL | Status: DC
  Administered 2022-07-10 – 2022-07-13 (×4): 10 mg via ORAL
  Filled 2022-07-09 (×5): qty 1

## 2022-07-09 MED ORDER — ONDANSETRON HCL 4 MG PO TABS: 4.0000 mg | ORAL_TABLET | ORAL | Status: DC | PRN

## 2022-07-09 MED ORDER — SERTRALINE HCL 100 MG PO TABS
100.0000 mg | ORAL_TABLET | Freq: Every morning | ORAL | Status: DC
  Administered 2022-07-09 – 2022-07-14 (×6): 100 mg via ORAL
  Filled 2022-07-09: qty 2
  Filled 2022-07-09: qty 1
  Filled 2022-07-09: qty 2
  Filled 2022-07-09 (×3): qty 1

## 2022-07-09 MED ORDER — DEXAMETHASONE SODIUM PHOSPHATE 4 MG/ML IJ SOLN
4.0000 mg | Freq: Three times a day (TID) | INTRAMUSCULAR | Status: AC
  Administered 2022-07-09 – 2022-07-10 (×4): 4 mg via INTRAVENOUS
  Filled 2022-07-09 (×4): qty 1

## 2022-07-09 MED ORDER — DEXAMETHASONE 2 MG PO TABS
2.0000 mg | ORAL_TABLET | Freq: Three times a day (TID) | ORAL | Status: AC
  Administered 2022-07-10 – 2022-07-11 (×3): 2 mg via ORAL
  Filled 2022-07-09: qty 1
  Filled 2022-07-09: qty 4
  Filled 2022-07-09: qty 1

## 2022-07-09 MED ORDER — ENOXAPARIN SODIUM 40 MG/0.4ML IJ SOSY
40.0000 mg | PREFILLED_SYRINGE | Freq: Every day | INTRAMUSCULAR | Status: DC
  Administered 2022-07-09 – 2022-07-14 (×6): 40 mg via SUBCUTANEOUS
  Filled 2022-07-09 (×6): qty 0.4

## 2022-07-09 MED ORDER — MORPHINE SULFATE (PF) 2 MG/ML IV SOLN
1.0000 mg | INTRAVENOUS | Status: DC | PRN
  Administered 2022-07-09: 2 mg via INTRAVENOUS
  Filled 2022-07-09: qty 1

## 2022-07-09 MED ORDER — DEXAMETHASONE 2 MG PO TABS: 1.0000 mg | ORAL_TABLET | Freq: Every day | ORAL | Status: DC

## 2022-07-09 MED ORDER — PROMETHAZINE HCL 25 MG PO TABS: 12.5000 mg | ORAL_TABLET | ORAL | Status: DC | PRN

## 2022-07-09 MED ORDER — DEXAMETHASONE 0.5 MG PO TABS: 2.0000 mg | ORAL_TABLET | Freq: Two times a day (BID) | ORAL | Status: DC

## 2022-07-09 MED ORDER — NICARDIPINE HCL IN NACL 20-0.86 MG/200ML-% IV SOLN: 3.0000 mg/h | INTRAVENOUS | Status: DC

## 2022-07-09 MED ORDER — DEXAMETHASONE 2 MG PO TABS
2.0000 mg | ORAL_TABLET | Freq: Two times a day (BID) | ORAL | Status: AC
  Administered 2022-07-11 – 2022-07-13 (×4): 2 mg via ORAL
  Filled 2022-07-09 (×4): qty 1

## 2022-07-09 MED ORDER — ORAL CARE MOUTH RINSE: 15.0000 mL | OROMUCOSAL | Status: DC | PRN

## 2022-07-09 MED ORDER — RISPERIDONE 0.5 MG PO TABS
3.0000 mg | ORAL_TABLET | Freq: Every day | ORAL | Status: DC
  Administered 2022-07-10 – 2022-07-13 (×4): 3 mg via ORAL
  Filled 2022-07-09: qty 6
  Filled 2022-07-09: qty 1
  Filled 2022-07-09 (×2): qty 6
  Filled 2022-07-09: qty 1
  Filled 2022-07-09: qty 6

## 2022-07-09 MED ORDER — LABETALOL HCL 5 MG/ML IV SOLN: 10.0000 mg | INTRAVENOUS | Status: DC | PRN

## 2022-07-09 MED ORDER — LORAZEPAM 2 MG/ML IJ SOLN
2.0000 mg | Freq: Once | INTRAMUSCULAR | Status: AC
  Administered 2022-07-09: 2 mg via INTRAVENOUS
  Filled 2022-07-09: qty 1

## 2022-07-09 MED ORDER — ACETAMINOPHEN 650 MG RE SUPP: 650.0000 mg | RECTAL | Status: DC | PRN

## 2022-07-09 MED ORDER — CEFAZOLIN SODIUM-DEXTROSE 1-4 GM/50ML-% IV SOLN
1.0000 g | Freq: Three times a day (TID) | INTRAVENOUS | Status: AC
  Administered 2022-07-09 (×3): 1 g via INTRAVENOUS
  Filled 2022-07-09 (×3): qty 50

## 2022-07-09 MED ORDER — ACETAMINOPHEN 325 MG PO TABS: 650.0000 mg | ORAL_TABLET | ORAL | Status: DC | PRN

## 2022-07-09 MED ORDER — ENALAPRILAT 1.25 MG/ML IV SOLN: 1.2500 mg | Freq: Four times a day (QID) | INTRAVENOUS | Status: DC | PRN

## 2022-07-09 MED ORDER — DEXAMETHASONE 0.5 MG PO TABS
2.0000 mg | ORAL_TABLET | Freq: Three times a day (TID) | ORAL | Status: DC
  Filled 2022-07-09: qty 4

## 2022-07-09 MED ORDER — CHLORHEXIDINE GLUCONATE CLOTH 2 % EX PADS
6.0000 | MEDICATED_PAD | Freq: Every day | CUTANEOUS | Status: DC
  Administered 2022-07-09 – 2022-07-12 (×4): 6 via TOPICAL

## 2022-07-09 MED ORDER — CLOZAPINE 25 MG PO TABS
75.0000 mg | ORAL_TABLET | Freq: Two times a day (BID) | ORAL | Status: DC
  Administered 2022-07-09 – 2022-07-14 (×10): 75 mg via ORAL
  Filled 2022-07-09 (×13): qty 3

## 2022-07-09 MED ORDER — DEXAMETHASONE 0.5 MG PO TABS: 1.0000 mg | ORAL_TABLET | Freq: Two times a day (BID) | ORAL | Status: DC

## 2022-07-09 MED ORDER — ORAL CARE MOUTH RINSE
15.0000 mL | OROMUCOSAL | Status: DC
  Administered 2022-07-09 – 2022-07-14 (×21): 15 mL via OROMUCOSAL

## 2022-07-09 MED ORDER — ONDANSETRON HCL 4 MG/2ML IJ SOLN: 4.0000 mg | INTRAMUSCULAR | Status: DC | PRN

## 2022-07-09 MED ORDER — DEXAMETHASONE 2 MG PO TABS
1.0000 mg | ORAL_TABLET | Freq: Two times a day (BID) | ORAL | Status: DC
  Administered 2022-07-13 – 2022-07-14 (×2): 1 mg via ORAL
  Filled 2022-07-09 (×2): qty 1

## 2022-07-09 MED ORDER — DEXAMETHASONE 0.5 MG PO TABS: 1.0000 mg | ORAL_TABLET | Freq: Every day | ORAL | Status: DC

## 2022-07-09 MED FILL — Thrombin For Soln 5000 Unit: CUTANEOUS | Qty: 5000 | Status: AC

## 2022-07-09 MED FILL — Thrombin For Soln 20000 Unit: CUTANEOUS | Qty: 1 | Status: AC

## 2022-07-09 NOTE — Progress Notes (Signed)
Subjective: Patient reports headache this morning, has responded to pain medication  Objective: Vital signs in last 24 hours: Temp:  [97.1 F (36.2 C)-100.1 F (37.8 C)] 100.1 F (37.8 C) (12/06 1200) Pulse Rate:  [59-104] 79 (12/06 1500) Resp:  [0-26] 26 (12/06 1500) BP: (97-144)/(45-101) 119/45 (12/06 1500) SpO2:  [92 %-100 %] 95 % (12/06 1500) Arterial Line BP: (87-130)/(52-94) 119/88 (12/06 0700)  Intake/Output from previous day: 12/05 0701 - 12/06 0700 In: 2990.8 [I.V.:2740.8; IV Piggyback:250] Out: 2125 [Urine:1875; Blood:250] Intake/Output this shift: Total I/O In: 774.7 [I.V.:624.8; IV Piggyback:149.9] Out: 950 [Urine:950]  Awake, alert.  Oriented to person, confused but exam limited by language barrier.  Per family, her mentation unchanged from preoperatively FC x 4, no drift Scalp dressing reinforced with gauze, currently c/d  Lab Results: Recent Labs    07/07/22 0736 07/09/22 0356  WBC 4.9 6.1  HGB 11.8* 11.3*  HCT 37.1 35.3*  PLT 187 192   BMET Recent Labs    07/07/22 0736 07/08/22 0650 07/09/22 0356  NA 139  --  139  K 4.5  --  4.5  CL 110  --  113*  CO2 20*  --  20*  GLUCOSE 104*  --  157*  BUN 16  --  12  CREATININE 1.05* 1.02* 0.97  CALCIUM 8.9  --  8.0*    Studies/Results: CT DBS HEAD W/O CONTRAST (1MM)  Result Date: 07/08/2022 CLINICAL DATA:  Brain mass.  CT for surgical planning. EXAM: CT HEAD WITHOUT CONTRAST TECHNIQUE: Contiguous axial images were obtained from the base of the skull through the vertex without intravenous contrast. RADIATION DOSE REDUCTION: This exam was performed according to the departmental dose-optimization program which includes automated exposure control, adjustment of the mA and/or kV according to patient size and/or use of iterative reconstruction technique. COMPARISON:  Head MRI 07/03/2022 and head CT 07/01/2022 FINDINGS: Brain: An extra-axial mass along the posterior aspect of the falx in the left parietal region  is unchanged from the recent prior examinations, and associated mild vasogenic edema in the left parietal white matter is also unchanged. There is no evidence of an acute infarct, intracranial hemorrhage, midline shift, or extra-axial fluid collection. The ventricles are normal in size. There is a partially empty sella. Vascular: No hyperdense vessel. Skull: No fracture or suspicious osseous lesion. Sinuses/Orbits: Minimal mucosal thickening in the paranasal sinuses. No significant mastoid fluid. Unremarkable orbits. Other: None. IMPRESSION: CT for surgical planning. 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 Electronically Signed   By: Logan Bores M.D.   On: 07/08/2022 09:40    Assessment/Plan: S/p resection of parasagittal meningioma - awaiting MRI with oral sedation - likely transfer out of ICU tomorrow morning - cont Keppra indefinitely - dex taper - activity ad lib   Vallarie Mare 07/09/2022, 4:09 PM

## 2022-07-09 NOTE — Progress Notes (Signed)
PROGRESS NOTE  Jenna Powell FMB:[id number] DOB: 1951-02-07 DOA: 07/01/2022 PCP: Pcp, No   LOS: 7 days   Brief Narrative / Interim history: Jenna Powell is a 71 y.o. female with medical history significant of bipolar disorder, dementia, hypertension, diabetes who was found down at her facility.  Initial history obtained from facility and chart review, patient's mental status appears to be limited at baseline.  Patient episodes of vomiting with pinpoint pupils per facility found to be minimally responsive.  In the ED patient's mental status continued to wax and wane minimally improving, admitted for seizure evaluation by hospitalist team, neurology consulting.  Neurosurgery consulted due to mass as noted on CT.   Subjective / 24h Interval events: Fused, somewhat agitated this morning.  Assesement and Plan: Principal Problem:   Acute encephalopathy Active Problems:   Dementia without behavioral disturbance (HCC)   Bipolar disorder (HCC)   DM (diabetes mellitus), type 2 (HCC)   Essential hypertension   Meningioma (HCC)   Vasogenic edema (HCC)   Seizure ([facility])   Principal problem Questionable acute onset seizure -Likely provoked seizure secondary to meningioma as below -continue Keppra per neurology.  Active problems Acute metabolic encephalopathy -increasingly agitated this morning, requiring mittens.  Has underlying dementia.  There was concern for polypharmacy, however would favor resuming some of her home medications to prevent withdrawals.  Resume clozapine, risperidone, Zoloft, Aricept.  Continue to hold trazodone.  Continue Cogentin  Meningioma -CT head remarkable for 3 x 3.5 cm mass concerning for meningioma with questionable vasogenic edema.  Neurosurgery consulted, she was taken to the OR on 12/5 and is status post tumor excision.  Continue Decadron taper as per neurosurgery.   Bipolar disorder, dementia -chronic medications due to agitation.  Hypertension - Continue  metoprolol   Diabetes - SSI  CBG (last 3)  Recent Labs    07/08/22 1817 07/09/22 0014 07/09/22 0815  GLUCAP 90 103* 125*   Contaminated blood culture- 1/2 positive for aerococcus - no signs/symptoms of infection, follow  Scheduled Meds:  benztropine  1 mg Oral BID   Chlorhexidine Gluconate Cloth  6 each Topical Daily   clozapine  75 mg Oral BID   dexamethasone (DECADRON) injection  4 mg Intravenous Q8H   dexamethasone  2 mg Oral Q8H   Followed by   [START ON 07/11/2022] dexamethasone  2 mg Oral Q12H   Followed by   Derrill Memo ON 07/13/2022] dexamethasone  1 mg Oral Q12H   Followed by   Derrill Memo ON 07/15/2022] dexamethasone  1 mg Oral Daily   Followed by   Derrill Memo ON 07/17/2022] dexamethasone  1 mg Oral Daily   donepezil  10 mg Oral QHS   enoxaparin (LOVENOX) injection  40 mg Subcutaneous Daily   insulin aspart  0-9 Units Subcutaneous TID WC   lithium carbonate  150 mg Oral Q breakfast   lithium carbonate  300 mg Oral Q supper   mirabegron ER  50 mg Oral Daily   mupirocin ointment  1 Application Nasal BID   mouth rinse  15 mL Mouth Rinse 4 times per day   pravastatin  20 mg Oral QHS   risperiDONE  3 mg Oral QHS   sertraline  100 mg Oral q AM   sodium chloride flush  3 mL Intravenous Q12H   Continuous Infusions:  0.9 % NaCl with KCl 20 mEq / L 100 mL/hr at 07/09/22 0600    ceFAZolin (ANCEF) IV Stopped (07/09/22 0345)   levETIRAcetam 500 mg (07/09/22 1039)  niCARDipine Stopped (07/09/22 0315)   PRN Meds:.acetaminophen **OR** acetaminophen, enalaprilat, HYDROcodone-acetaminophen, labetalol, morphine injection, ondansetron **OR** ondansetron (ZOFRAN) IV, mouth rinse, mouth rinse, polyethylene glycol, promethazine  Current Outpatient Medications  Medication Instructions   aspirin 81 mg, Oral, Every morning   benztropine (COGENTIN) 1 mg, Oral, 2 times daily   Calcium Carb-Cholecalciferol (CALCIUM + VITAMIN D3) 600-10 MG-MCG TABS 1 tablet, Oral, Every 12 hours   Cholecalciferol  2,000 Units, Oral, Every morning   clozapine (CLOZARIL) 75 mg, Oral, 2 times daily   Dextran 70-Hypromellose, PF, (GENTEAL TEARS MODERATE PF) 0.1-0.3 % SOLN 1 drop, Both Eyes, 3 times daily   docusate sodium (COLACE) 100 mg, Oral, 3 times daily   donepezil (ARICEPT) 10 mg, Oral, Daily at bedtime   famotidine (PEPCID) 20 mg, Oral, Daily at bedtime   Fiber-Lax 625 mg, Oral, 2 times daily   lactulose (CHRONULAC) 20 g, Oral, Every morning   lithium carbonate 150-300 mg, Oral, See admin instructions, 2 entries on MAR:<BR>150 mg every morning, 300 mg at bedtime   metoprolol succinate (TOPROL-XL) 25 mg, Oral, Every morning   mirabegron ER (MYRBETRIQ) 50 mg, Oral, Every morning   pravastatin (PRAVACHOL) 20 mg, Oral, Daily at bedtime   risperiDONE (RISPERDAL) 3 mg, Oral, Daily at bedtime   Salicylic Acid (SELSUN BL[employer]UR[employer] DRY SCALP) 3 % SHAM 1 application , Topical, See admin instructions, Apply to scalp on shower days (Monday, Wednesday)   senna (SENOKOT) 17.2 mg, Oral, Daily at bedtime   sertraline (ZOLOFT) 100 mg, Oral, Every morning   traZODone (DESYREL) 50 mg, Oral, Daily at bedtime    Diet Orders (From admission, onward)     Start     Ordered   07/09/22 0845  Diet heart healthy/carb modified Room service appropriate? Yes with Assist; Fluid consistency: Thin  Diet effective now       Comments: Please send house tray  Question Answer Comment  Diet-HS Snack? Nothing   Room service appropriate? Yes with Assist   Fluid consistency: Thin      07/09/22 0845            DVT prophylaxis: enoxaparin (LOVENOX) injection 40 mg Start: 07/09/22 1800 SCDs Start: 07/09/22 0211 SCD's Start: 07/08/22 0816   Lab Results  Component Value Date   PLT 192 07/09/2022      Code Status: Full Code  Family Communication: no family at bedside   Status is: Inpatient  Remains inpatient appropriate because: severity of illness  Level of care: ICU  Consultants:  Neurology Neurosurgery    Objective: Vitals:   07/09/22 0400 07/09/22 0500 07/09/22 0600 07/09/22 0800  BP: 125/80 132/70 127/74   Pulse: 77 85 82   Resp: 13 12 (!) 0   Temp: 99 F (37.2 C)   98.3 F (36.8 C)  TempSrc: Axillary   Axillary  SpO2: 98% 99% 99%   Height:        Intake/Output Summary (Last 24 hours) at 07/09/2022 1055 Last data filed at 07/09/2022 0600 Gross per 24 hour  Intake 2990.79 ml  Output 2125 ml  Net 865.79 ml   Wt Readings from Last 3 Encounters:  06/13/21 100.2 kg  01/26/20 90.7 kg  11/21/18 90.7 kg    Examination:  Constitutional: Agitated Eyes: no scleral icterus ENMT: Mucous membranes are moist.  Neck: normal, supple Respiratory: clear to auscultation bilaterally, no wheezing, no crackles.  Cardiovascular: Regular rate and rhythm, no murmurs / rubs / gallops. No LE edema.  Abdomen: non distended, no tenderness.  Bowel sounds positive.  Musculoskeletal: no clubbing / cyanosis.  Skin: no rashes Neurologic: non focal   Data Reviewed: I have independently reviewed following labs and imaging studies   CBC Recent Labs  Lab 07/07/22 0736 07/09/22 0356  WBC 4.9 6.1  HGB 11.8* 11.3*  HCT 37.1 35.3*  PLT 187 192  MCV 86.7 87.4  MCH 27.6 28.0  MCHC 31.8 32.0  RDW 12.5 12.5  LYMPHSABS  --  0.5*  MONOABS  --  0.1  EOSABS  --  0.0  BASOSABS  --  0.0    Recent Labs  Lab 07/07/22 0736 07/08/22 0650 07/09/22 0356  NA 139  --  139  K 4.5  --  4.5  CL 110  --  113*  CO2 20*  --  20*  GLUCOSE 104*  --  157*  BUN 16  --  12  CREATININE 1.05* 1.02* 0.97  CALCIUM 8.9  --  8.0*    ------------------------------------------------------------------------------------------------------------------ No results for input(s): "CHOL", "HDL", "LDLCALC", "TRIG", "CHOLHDL", "LDLDIRECT" in the last 72 hours.  No results found for: "HGBA1C" ------------------------------------------------------------------------------------------------------------------ No results for  input(s): "TSH", "T4TOTAL", "T3FREE", "THYROIDAB" in the last 72 hours.  Invalid input(s): "FREET3"  Cardiac Enzymes No results for input(s): "CKMB", "TROPONINI", "MYOGLOBIN" in the last 168 hours.  Invalid input(s): "CK" ------------------------------------------------------------------------------------------------------------------ No results found for: "BNP"  CBG: Recent Labs  Lab 07/08/22 1234 07/08/22 1722 07/08/22 1817 07/09/22 0014 07/09/22 0815  GLUCAP 97 86 90 103* 125*    Recent Results (from the past 240 hour(s))  Blood Culture (Routine X 2)     Status: None   Collection Time: 07/01/22 12:21 PM   Specimen: BLOOD  Result Value Ref Range Status   Specimen Description BLOOD SITE NOT SPECIFIED  Final   Special Requests   Final    BOTTLES DRAWN AEROBIC AND ANAEROBIC Blood Culture results may not be optimal due to an inadequate volume of blood received in culture bottles   Culture   Final    NO GROWTH 5 DAYS Performed at [facility], [place] [address], [place], [place] [address]    Report Status 07/06/2022 FINAL  Final  Blood Culture (Routine X 2)     Status: Abnormal   Collection Time: 07/01/22 12:26 PM   Specimen: BLOOD  Result Value Ref Range Status   Specimen Description BLOOD SITE NOT SPECIFIED  Final   Special Requests (A)  Final    AEROCOCCUS SPECIES Blood Culture results may not be optimal due to an inadequate volume of blood received in culture bottles   Culture   Final    NO GROWTH 5 DAYS Performed at [facility], [place] [address], [place], [place] [address]    Report Status 07/06/2022 FINAL  Final  Resp Panel by RT-PCR (Flu A&B, Covid) Anterior Nasal Swab     Status: None   Collection Time: 07/01/22  3:15 PM   Specimen: Anterior Nasal Swab  Result Value Ref Range Status   SARS Coronavirus 2 by RT PCR NEGATIVE NEGATIVE Final    Comment: (NOTE) SARS-CoV-2 target nucleic acids are NOT DETECTED.  The SARS-CoV-2 RNA is generally detectable  in upper respiratory specimens during the acute phase of infection. The lowest concentration of SARS-CoV-2 viral copies this assay can detect is 138 copies/mL. A negative result does not preclude SARS-Cov-2 infection and should not be used as the sole basis for treatment or other patient management decisions. A negative result may occur with  improper  specimen collection/handling, submission of specimen other than nasopharyngeal swab, presence of viral mutation(s) within the areas targeted by this assay, and inadequate number of viral copies(<138 copies/mL). A negative result must be combined with clinical observations, patient history, and epidemiological information. The expected result is Negative.  Fact Sheet for Patients:  EntrepreneurPulse.com.au  Fact Sheet for Healthcare Providers:  IncredibleEmployment.be  This test is no t yet approved or cleared by the [place] FDA and  has been authorized for detection and/or diagnosis of SARS-CoV-2 by FDA under an Emergency Use Authorization (EUA). This EUA will remain  in effect (meaning this test can be used) for the duration of the COVID-19 declaration under Section 564(b)(1) of the Act, 21 U.S.C.section 360bbb-3(b)(1), unless the authorization is terminated  or revoked sooner.       Influenza A by PCR NEGATIVE NEGATIVE Final   Influenza B by PCR NEGATIVE NEGATIVE Final    Comment: (NOTE) The Xpert Xpress SARS-CoV-2/FLU/RSV plus assay is intended as an aid in the diagnosis of influenza from Nasopharyngeal swab specimens and should not be used as a sole basis for treatment. Nasal washings and aspirates are unacceptable for Xpert Xpress SARS-CoV-2/FLU/RSV testing.  Fact Sheet for Patients: EntrepreneurPulse.com.au  Fact Sheet for Healthcare Providers: IncredibleEmployment.be  This test is not yet approved or cleared by the [place] FDA and has  been authorized for detection and/or diagnosis of SARS-CoV-2 by FDA under an Emergency Use Authorization (EUA). This EUA will remain in effect (meaning this test can be used) for the duration of the COVID-19 declaration under Section 564(b)(1) of the Act, 21 U.S.C. section 360bbb-3(b)(1), unless the authorization is terminated or revoked.  Performed at [facility], [place] [address] [place], [place] [address]   Surgical PCR screen     Status: None   Collection Time: 07/08/22  3:40 AM   Specimen: Nasal Mucosa; Nasal Swab  Result Value Ref Range Status   MRSA, PCR NEGATIVE NEGATIVE Final   Staphylococcus aureus NEGATIVE NEGATIVE Final    Comment: (NOTE) The Xpert SA Assay (FDA approved for NASAL specimens in patients 46 years of age and older), is one component of a comprehensive surveillance program. It is not intended to diagnose infection nor to guide or monitor treatment. Performed at [facility], [place] [address] [place], [place] [address]      Radiology Studies: No results found.   Marzetta Board, MD, PhD [facility]  Between 7 am - 7 pm I am available, please contact me via Amion (for emergencies) or Securechat (non urgent messages)  Between 7 pm - 7 am I am not available, please contact night coverage MD/APP via Amion

## 2022-07-09 NOTE — Transfer of Care (Signed)
Immediate Anesthesia Transfer of Care Note  Patient: Jenna Powell  Procedure(s) Performed: PARIETAL CRANIOTOMY TUMOR EXCISION (Left) APPLICATION OF CRANIAL NAVIGATION (Left)  Patient Location: PACU  Anesthesia Type:General  Level of Consciousness: drowsy  Airway & Oxygen Therapy: Patient Spontanous Breathing and Patient connected to nasal cannula oxygen  Post-op Assessment: Report given to RN, Post -op Vital signs reviewed and stable, and Patient moving all extremities  Post vital signs: Reviewed and stable  Last Vitals:  Vitals Value Taken Time  BP 108/59 07/09/22 0011  Temp    Pulse 66 07/09/22 0012  Resp 17 07/09/22 0016  SpO2 93 % 07/09/22 0012  Vitals shown include unvalidated device data.  Last Pain:  Vitals:   07/08/22 1908  TempSrc: Oral  PainSc:          Complications: No notable events documented.

## 2022-07-09 NOTE — Progress Notes (Signed)
Patient has been hard to arouse since her arrival to recovery. Unable to assess neuro functions effectively after her procedure. Vitals are stable and patient is protecting her airway. Dr. Marcello Moores and Dr. Glennon Mac with Anesthesia made aware.

## 2022-07-09 NOTE — Anesthesia Postprocedure Evaluation (Signed)
Anesthesia Post Note  Patient: Jenna Powell  Procedure(s) Performed: PARIETAL CRANIOTOMY TUMOR EXCISION (Left) APPLICATION OF CRANIAL NAVIGATION (Left)     Patient location during evaluation: PACU Anesthesia Type: General Level of consciousness: sedated and lethargic Pain management: pain level controlled Vital Signs Assessment: post-procedure vital signs reviewed and stable Respiratory status: spontaneous breathing, respiratory function stable and patient connected to nasal cannula oxygen Cardiovascular status: blood pressure returned to baseline Postop Assessment: no apparent nausea or vomiting Anesthetic complications: no   No notable events documented.  Last Vitals:  Vitals:   07/09/22 0030 07/09/22 0045  BP: 117/69 118/63  Pulse: 62 65  Resp: 11 14  Temp:    SpO2: 99% 100%    Last Pain:  Vitals:   07/08/22 1908  TempSrc: Oral  PainSc:                  Seleta Rhymes. Treveon Bourcier

## 2022-07-09 NOTE — Progress Notes (Addendum)
PHARMACIST - PHYSICIAN ORDER COMMUNICATION  Jenna Powell is a 71 y.o. year old female with a history of bipolar disorder/schizophrenia on Clozapine PTA. Continuing this medication order as an inpatient requires that monitoring parameters per REMS requirements must be met.   Clozapine REMS Dispense Authorization was obtained, and will dispense inpatient.  RDA code [id number].  Verified Clozapine dose: 21m PO BID  (noted - birthday listed as [phone] clozapine REMS site)  Last ANC value and date reported on the Clozapine REMS website: 07/09/2022 AHowellmonitoring frequency: monthly Jenna AEdwards AFBreporting is due on (date) 08/10/22.  Jenna Powell 07/09/2022, 11:53 AM

## 2022-07-10 DIAGNOSIS — G9340 Encephalopathy, unspecified: Secondary | ICD-10-CM | POA: Diagnosis not present

## 2022-07-10 LAB — CBC
HCT: 37.8 % (ref 36.0–46.0)
Hemoglobin: 12.3 g/dL (ref 12.0–15.0)
MCH: 28.2 pg (ref 26.0–34.0)
MCHC: 32.5 g/dL (ref 30.0–36.0)
MCV: 86.7 fL (ref 80.0–100.0)
Platelets: 200 10*3/uL (ref 150–400)
RBC: 4.36 MIL/uL (ref 3.87–5.11)
RDW: 13 % (ref 11.5–15.5)
WBC: 9 10*3/uL (ref 4.0–10.5)
nRBC: 0 % (ref 0.0–0.2)

## 2022-07-10 LAB — COMPREHENSIVE METABOLIC PANEL
ALT: 12 U/L (ref 0–44)
AST: 13 U/L — ABNORMAL LOW (ref 15–41)
Albumin: 3.1 g/dL — ABNORMAL LOW (ref 3.5–5.0)
Alkaline Phosphatase: 71 U/L (ref 38–126)
Anion gap: 6 (ref 5–15)
BUN: 10 mg/dL (ref 8–23)
CO2: 21 mmol/L — ABNORMAL LOW (ref 22–32)
Calcium: 9 mg/dL (ref 8.9–10.3)
Chloride: 113 mmol/L — ABNORMAL HIGH (ref 98–111)
Creatinine, Ser: 0.99 mg/dL (ref 0.44–1.00)
GFR, Estimated: >60 mL/min (ref >60)
Glucose, Bld: 135 mg/dL — ABNORMAL HIGH (ref 70–99)
Potassium: 4.7 mmol/L (ref 3.5–5.1)
Sodium: 140 mmol/L (ref 135–145)
Total Bilirubin: 0.4 mg/dL (ref 0.3–1.2)
Total Protein: 6 g/dL — ABNORMAL LOW (ref 6.5–8.1)

## 2022-07-10 LAB — MAGNESIUM: Magnesium: 2.2 mg/dL (ref 1.7–2.4)

## 2022-07-10 LAB — GLUCOSE, CAPILLARY
Glucose-Capillary: 131 mg/dL — ABNORMAL HIGH (ref 70–99)
Glucose-Capillary: 146 mg/dL — ABNORMAL HIGH (ref 70–99)
Glucose-Capillary: 95 mg/dL (ref 70–99)
Glucose-Capillary: 113 mg/dL — ABNORMAL HIGH (ref 70–99)

## 2022-07-10 MED ORDER — LEVETIRACETAM 500 MG PO TABS
500.0000 mg | ORAL_TABLET | Freq: Two times a day (BID) | ORAL | Status: DC
  Administered 2022-07-10 – 2022-07-14 (×8): 500 mg via ORAL
  Filled 2022-07-10 (×8): qty 1

## 2022-07-10 NOTE — Progress Notes (Signed)
Subjective: Patient reports no headache currently  Objective: Vital signs in last 24 hours: Temp:  [99 F (37.2 C)-100.1 F (37.8 C)] 99 F (37.2 C) (12/07 0800) Pulse Rate:  [72-108] 77 (12/07 0900) Resp:  [15-32] 22 (12/07 1000) BP: (111-141)/(45-101) 138/75 (12/07 1000) SpO2:  [95 %-99 %] 97 % (12/07 0900) Weight:  [101.1 kg] 101.1 kg (12/07 0500)  Intake/Output from previous day: 12/06 0701 - 12/07 0700 In: 2318.1 [I.V.:2018.1; IV Piggyback:300] Out: 3299 [Urine:3325] Intake/Output this shift: Total I/O In: 500 [I.V.:500] Out: -   Alert, eyes open spont, PERRL, oriented to person FC x 4, no drift Dressing c/d  Lab Results: Recent Labs    07/09/22 0356 07/10/22 0431  WBC 6.1 9.0  HGB 11.3* 12.3  HCT 35.3* 37.8  PLT 192 200   BMET Recent Labs    07/09/22 0356 07/10/22 0431  NA 139 140  K 4.5 4.7  CL 113* 113*  CO2 20* 21*  GLUCOSE 157* 135*  BUN 12 10  CREATININE 0.97 0.99  CALCIUM 8.0* 9.0    Studies/Results: MR BRAIN WO CONTRAST  Result Date: 07/09/2022 CLINICAL DATA:  Brain CNS neoplasm.  Assess treatment response. EXAM: MRI HEAD WITHOUT CONTRAST TECHNIQUE: Multiplanar, multiecho pulse sequences of the brain and surrounding structures were obtained without intravenous contrast. COMPARISON:  MRI Brain 07/03/22, CT DBS 07/08/22 FINDINGS: Limitations: Markedly limited exam due to the degree of severe motion artifact, even on the diffusion-weighted and propeller sequences. Brain: There are postsurgical changes from an interval left parietal craniotomy and tumor resection. There is T2/FLAIR hyperintense signal abnormality along the anterior margin of the presumed resection cavity, unchanged from prior exam. There is a small amount of blood products within the resection cavity. On the sagittal T1 weighted imaging a small amount of the left parietal cortex appears extend into the craniotomy site (series 5, image 16). Vascular: Incompletely assessed due to the  degree of motion artifact. Skull and upper cervical spine: Normal marrow signal. Sinuses/Orbits: Negative. Other: There is soft tissue swelling along the parietal and occipital scalp (series 5, image 14). IMPRESSION: 1. Markedly limited exam due to the degree of severe motion artifact, even on the diffusion-weighted and propeller sequences. 2. Postsurgical changes from an interval left parietal craniotomy and tumor resection. On the sagittal T1 weighted imaging a small amount of the left parietal cortex appears to extend into the craniotomy defect. This may be artifactual due to the degree of motion artifact, but further evaluation with a CT of the brain could be considered for more definitive characterization. Electronically Signed   By: Marin Roberts M.D.   On: 07/09/2022 16:55    Assessment/Plan: S/p craniotomy for parietal parasagittal meningioma - MRI significantly limited by motion.  However, the quality is sufficient for me to confidently say no adverse postoperative findings are present. - cont Keppra - dex taper - transfer out of ICU - will need staple removal in 12 days   Vallarie Mare 07/10/2022, 11:05 AM

## 2022-07-10 NOTE — Progress Notes (Signed)
OT Cancellation Note  Patient Details Name: Jenna Powell MRN: [id number] DOB: 04/15/1951   Cancelled Treatment:    Reason Eval/Treat Not Completed: Patient at procedure or test/ unavailable.  OT to continue efforts as appropriate.    Reginia Battie D Larysa Pall 07/10/2022, 1:56 PM 07/10/2022  RP, OTR/L  Acute Rehabilitation Services  Office:  [phone]

## 2022-07-10 NOTE — Progress Notes (Signed)
PROGRESS NOTE  Jenna Powell UDJ:[id number] DOB: 03/05/1951 DOA: 07/01/2022 PCP: Pcp, No   LOS: 8 days   Brief Narrative / Interim history: Jenna Powell is a 71 y.o. female with medical history significant of bipolar disorder, dementia, hypertension, diabetes who was found down at her facility.  Initial history obtained from facility and chart review, patient's mental status appears to be limited at baseline.  Patient episodes of vomiting with pinpoint pupils per facility found to be minimally responsive.  In the ED patient's mental status continued to wax and wane minimally improving, admitted for seizure evaluation by hospitalist team, neurology consulting.  Neurosurgery consulted due to mass as noted on CT.   Subjective / 24h Interval events: Appears Calmer than yesterday morning.  No complaints for me  Assesement and Plan: Principal Problem:   Acute encephalopathy Active Problems:   Dementia without behavioral disturbance (HCC)   Bipolar disorder (HCC)   DM (diabetes mellitus), type 2 ([place])   Essential hypertension   Meningioma (HCC)   Vasogenic edema (HCC)   Seizure ([place])   Principal problem Questionable acute onset seizure -Likely provoked seizure secondary to meningioma as below -continue Keppra per neurology.  Active problems Acute metabolic encephalopathy -better today, home medications resumed, continue  Meningioma -CT head remarkable for 3 x 3.5 cm mass concerning for meningioma with questionable vasogenic edema.  Neurosurgery consulted, she was taken to the OR on 12/5 and is status post tumor excision.  Continue Decadron taper as per neurosurgery.   Bipolar disorder, dementia -continue chronic medications due to agitation.  Hypertension - Continue metoprolol   Diabetes - SSI, CBGs acceptable, as below  CBG (last 3)  Recent Labs    07/09/22 1802 07/09/22 2116 07/10/22 0801  GLUCAP 123* 161* 131*    Contaminated blood culture- 1/2 positive for  aerococcus - no signs/symptoms of infection, follow  Scheduled Meds:  benztropine  1 mg Oral BID   Chlorhexidine Gluconate Cloth  6 each Topical Daily   clozapine  75 mg Oral BID   dexamethasone  2 mg Oral Q8H   Followed by   Derrill Memo ON 07/11/2022] dexamethasone  2 mg Oral Q12H   Followed by   Derrill Memo ON 07/13/2022] dexamethasone  1 mg Oral Q12H   Followed by   Derrill Memo ON 07/16/2022] dexamethasone  1 mg Oral Daily   Followed by   Derrill Memo ON 07/18/2022] dexamethasone  1 mg Oral Daily   donepezil  10 mg Oral QHS   enoxaparin (LOVENOX) injection  40 mg Subcutaneous Daily   insulin aspart  0-9 Units Subcutaneous TID WC   lithium carbonate  150 mg Oral Q breakfast   lithium carbonate  300 mg Oral Q supper   mirabegron ER  50 mg Oral Daily   mupirocin ointment  1 Application Nasal BID   mouth rinse  15 mL Mouth Rinse 4 times per day   pravastatin  20 mg Oral QHS   risperiDONE  3 mg Oral QHS   sertraline  100 mg Oral q AM   sodium chloride flush  3 mL Intravenous Q12H   Continuous Infusions:  0.9 % NaCl with KCl 20 mEq / L 100 mL/hr at 07/10/22 1000   levETIRAcetam Stopped (07/09/22 2132)   niCARDipine Stopped (07/09/22 0315)   PRN Meds:.acetaminophen **OR** acetaminophen, enalaprilat, HYDROcodone-acetaminophen, labetalol, morphine injection, ondansetron **OR** ondansetron (ZOFRAN) IV, mouth rinse, mouth rinse, mouth rinse, polyethylene glycol, promethazine  Current Outpatient Medications  Medication Instructions   aspirin 81 mg, Oral, Every  morning   benztropine (COGENTIN) 1 mg, Oral, 2 times daily   Calcium Carb-Cholecalciferol (CALCIUM + VITAMIN D3) 600-10 MG-MCG TABS 1 tablet, Oral, Every 12 hours   Cholecalciferol 2,000 Units, Oral, Every morning   clozapine (CLOZARIL) 75 mg, Oral, 2 times daily   Dextran 70-Hypromellose, PF, (GENTEAL TEARS MODERATE PF) 0.1-0.3 % SOLN 1 drop, Both Eyes, 3 times daily   docusate sodium (COLACE) 100 mg, Oral, 3 times daily   donepezil (ARICEPT) 10  mg, Oral, Daily at bedtime   famotidine (PEPCID) 20 mg, Oral, Daily at bedtime   Fiber-Lax 625 mg, Oral, 2 times daily   lactulose (CHRONULAC) 20 g, Oral, Every morning   lithium carbonate 150-300 mg, Oral, See admin instructions, 2 entries on MAR:<BR>150 mg every morning, 300 mg at bedtime   metoprolol succinate (TOPROL-XL) 25 mg, Oral, Every morning   mirabegron ER (MYRBETRIQ) 50 mg, Oral, Every morning   pravastatin (PRAVACHOL) 20 mg, Oral, Daily at bedtime   risperiDONE (RISPERDAL) 3 mg, Oral, Daily at bedtime   Salicylic Acid (SELSUN BLUE [employer]URALS DRY SCALP) 3 % SHAM 1 application , Topical, See admin instructions, Apply to scalp on shower days (Monday, Wednesday)   senna (SENOKOT) 17.2 mg, Oral, Daily at bedtime   sertraline (ZOLOFT) 100 mg, Oral, Every morning   traZODone (DESYREL) 50 mg, Oral, Daily at bedtime    Diet Orders (From admission, onward)     Start     Ordered   07/09/22 0845  Diet heart healthy/carb modified Room service appropriate? Yes with Assist; Fluid consistency: Thin  Diet effective now       Comments: Please send house tray  Question Answer Comment  Diet-HS Snack? Nothing   Room service appropriate? Yes with Assist   Fluid consistency: Thin      07/09/22 0845            DVT prophylaxis: enoxaparin (LOVENOX) injection 40 mg Start: 07/09/22 1800 SCDs Start: 07/09/22 0211 SCD's Start: 07/08/22 0816   Lab Results  Component Value Date   PLT 200 07/10/2022      Code Status: Full Code  Family Communication: no family at bedside   Status is: Inpatient  Remains inpatient appropriate because: severity of illness  Level of care: ICU  Consultants:  Neurology Neurosurgery   Objective: Vitals:   07/10/22 0600 07/10/22 0800 07/10/22 0900 07/10/22 1000  BP: 111/67 127/72 121/73 138/75  Pulse: 73 72 77   Resp: 18 (!) 21 15 (!) 22  Temp:  99 F (37.2 C)    TempSrc:  Axillary    SpO2: 98% 98% 97%   Weight:      Height:         Intake/Output Summary (Last 24 hours) at 07/10/2022 1045 Last data filed at 07/10/2022 1000 Gross per 24 hour  Intake 2516.95 ml  Output 3325 ml  Net -808.05 ml    Wt Readings from Last 3 Encounters:  07/10/22 101.1 kg  06/13/21 100.2 kg  01/26/20 90.7 kg    Examination:  Constitutional: NAD Eyes: lids and conjunctivae normal, no scleral icterus ENMT: mmm Neck: normal, supple Respiratory: clear to auscultation bilaterally, no wheezing, no crackles. Normal respiratory effort.  Cardiovascular: Regular rate and rhythm, no murmurs / rubs / gallops. No LE edema. Abdomen: soft, no distention, no tenderness. Bowel sounds positive.    Data Reviewed: I have independently reviewed following labs and imaging studies   CBC Recent Labs  Lab 07/07/22 0736 07/09/22 0356 07/10/22 0431  WBC 4.9  6.1 9.0  HGB 11.8* 11.3* 12.3  HCT 37.1 35.3* 37.8  PLT 187 192 200  MCV 86.7 87.4 86.7  MCH 27.6 28.0 28.2  MCHC 31.8 32.0 32.5  RDW 12.5 12.5 13.0  LYMPHSABS  --  0.5*  --   MONOABS  --  0.1  --   EOSABS  --  0.0  --   BASOSABS  --  0.0  --      Recent Labs  Lab 07/07/22 0736 07/08/22 0650 07/09/22 0356 07/10/22 0431  NA 139  --  139 140  K 4.5  --  4.5 4.7  CL 110  --  113* 113*  CO2 20*  --  20* 21*  GLUCOSE 104*  --  157* 135*  BUN 16  --  12 10  CREATININE 1.05* 1.02* 0.97 0.99  CALCIUM 8.9  --  8.0* 9.0  AST  --   --   --  13*  ALT  --   --   --  12  ALKPHOS  --   --   --  71  BILITOT  --   --   --  0.4  ALBUMIN  --   --   --  3.1*  MG  --   --   --  2.2     ------------------------------------------------------------------------------------------------------------------ No results for input(s): "CHOL", "HDL", "LDLCALC", "TRIG", "CHOLHDL", "LDLDIRECT" in the last 72 hours.  No results found for: "HGBA1C" ------------------------------------------------------------------------------------------------------------------ No results for input(s): "TSH",  "T4TOTAL", "T3FREE", "THYROIDAB" in the last 72 hours.  Invalid input(s): "FREET3"  Cardiac Enzymes No results for input(s): "CKMB", "TROPONINI", "MYOGLOBIN" in the last 168 hours.  Invalid input(s): "CK" ------------------------------------------------------------------------------------------------------------------ No results found for: "BNP"  CBG: Recent Labs  Lab 07/09/22 1113 07/09/22 1545 07/09/22 1802 07/09/22 2116 07/10/22 0801  GLUCAP 150* 114* 123* 161* 131*     Recent Results (from the past 240 hour(s))  Blood Culture (Routine X 2)     Status: None   Collection Time: 07/01/22 12:21 PM   Specimen: BLOOD  Result Value Ref Range Status   Specimen Description BLOOD SITE NOT SPECIFIED  Final   Special Requests   Final    BOTTLES DRAWN AEROBIC AND ANAEROBIC Blood Culture results may not be optimal due to an inadequate volume of blood received in culture bottles   Culture   Final    NO GROWTH 5 DAYS Performed at [facility], [place] [address], [place], [place] [address]    Report Status 07/06/2022 FINAL  Final  Blood Culture (Routine X 2)     Status: Abnormal   Collection Time: 07/01/22 12:26 PM   Specimen: BLOOD  Result Value Ref Range Status   Specimen Description BLOOD SITE NOT SPECIFIED  Final   Special Requests (A)  Final    AEROCOCCUS SPECIES Blood Culture results may not be optimal due to an inadequate volume of blood received in culture bottles   Culture   Final    NO GROWTH 5 DAYS Performed at [facility], [place] [address], [place], [place] [address]    Report Status 07/06/2022 FINAL  Final  Resp Panel by RT-PCR (Flu A&B, Covid) Anterior Nasal Swab     Status: None   Collection Time: 07/01/22  3:15 PM   Specimen: Anterior Nasal Swab  Result Value Ref Range Status   SARS Coronavirus 2 by RT PCR NEGATIVE NEGATIVE Final    Comment: (NOTE) SARS-CoV-2 target nucleic acids are NOT DETECTED.  The  SARS-CoV-2 RNA is generally detectable in upper  respiratory specimens during the acute phase of infection. The lowest concentration of SARS-CoV-2 viral copies this assay can detect is 138 copies/mL. A negative result does not preclude SARS-Cov-2 infection and should not be used as the sole basis for treatment or other patient management decisions. A negative result may occur with  improper specimen collection/handling, submission of specimen other than nasopharyngeal swab, presence of viral mutation(s) within the areas targeted by this assay, and inadequate number of viral copies(<138 copies/mL). A negative result must be combined with clinical observations, patient history, and epidemiological information. The expected result is Negative.  Fact Sheet for Patients:  EntrepreneurPulse.com.au  Fact Sheet for Healthcare Providers:  IncredibleEmployment.be  This test is no t yet approved or cleared by the [place] FDA and  has been authorized for detection and/or diagnosis of SARS-CoV-2 by FDA under an Emergency Use Authorization (EUA). This EUA will remain  in effect (meaning this test can be used) for the duration of the COVID-19 declaration under Section 564(b)(1) of the Act, 21 U.S.C.section 360bbb-3(b)(1), unless the authorization is terminated  or revoked sooner.       Influenza A by PCR NEGATIVE NEGATIVE Final   Influenza B by PCR NEGATIVE NEGATIVE Final    Comment: (NOTE) The Xpert Xpress SARS-CoV-2/FLU/RSV plus assay is intended as an aid in the diagnosis of influenza from Nasopharyngeal swab specimens and should not be used as a sole basis for treatment. Nasal washings and aspirates are unacceptable for Xpert Xpress SARS-CoV-2/FLU/RSV testing.  Fact Sheet for Patients: EntrepreneurPulse.com.au  Fact Sheet for Healthcare Providers: IncredibleEmployment.be  This test is not yet approved or cleared by the [place] FDA and has been  authorized for detection and/or diagnosis of SARS-CoV-2 by FDA under an Emergency Use Authorization (EUA). This EUA will remain in effect (meaning this test can be used) for the duration of the COVID-19 declaration under Section 564(b)(1) of the Act, 21 U.S.C. section 360bbb-3(b)(1), unless the authorization is terminated or revoked.  Performed at [facility], [place] [address] [place], [place] [address]   Surgical PCR screen     Status: None   Collection Time: 07/08/22  3:40 AM   Specimen: Nasal Mucosa; Nasal Swab  Result Value Ref Range Status   MRSA, PCR NEGATIVE NEGATIVE Final   Staphylococcus aureus NEGATIVE NEGATIVE Final    Comment: (NOTE) The Xpert SA Assay (FDA approved for NASAL specimens in patients 28 years of age and older), is one component of a comprehensive surveillance program. It is not intended to diagnose infection nor to guide or monitor treatment. Performed at [facility], [place] [address], [place], [place] [address]      Radiology Studies: MR BRAIN WO CONTRAST  Result Date: 07/09/2022 CLINICAL DATA:  Brain CNS neoplasm.  Assess treatment response. EXAM: MRI HEAD WITHOUT CONTRAST TECHNIQUE: Multiplanar, multiecho pulse sequences of the brain and surrounding structures were obtained without intravenous contrast. COMPARISON:  MRI Brain 07/03/22, CT DBS 07/08/22 FINDINGS: Limitations: Markedly limited exam due to the degree of severe motion artifact, even on the diffusion-weighted and propeller sequences. Brain: There are postsurgical changes from an interval left parietal craniotomy and tumor resection. There is T2/FLAIR hyperintense signal abnormality along the anterior margin of the presumed resection cavity, unchanged from prior exam. There is a small amount of blood products within the resection cavity. On the sagittal T1 weighted imaging a small amount of the left parietal cortex appears extend into the craniotomy  site (series 5, image 16). Vascular:  Incompletely assessed due to the degree of motion artifact. Skull and upper cervical spine: Normal marrow signal. Sinuses/Orbits: Negative. Other: There is soft tissue swelling along the parietal and occipital scalp (series 5, image 14). IMPRESSION: 1. Markedly limited exam due to the degree of severe motion artifact, even on the diffusion-weighted and propeller sequences. 2. Postsurgical changes from an interval left parietal craniotomy and tumor resection. On the sagittal T1 weighted imaging a small amount of the left parietal cortex appears to extend into the craniotomy defect. This may be artifactual due to the degree of motion artifact, but further evaluation with a CT of the brain could be considered for more definitive characterization. Electronically Signed   By: Marin Roberts M.D.   On: 07/09/2022 16:55     Marzetta Board, MD, PhD [facility]  Between 7 am - 7 pm I am available, please contact me via Amion (for emergencies) or Securechat (non urgent messages)  Between 7 pm - 7 am I am not available, please contact night coverage MD/APP via Amion

## 2022-07-10 NOTE — Progress Notes (Signed)
Pt admitted to 3W12; Responds to voice.  Oriented to self; Denies pain.  Bed alarm set.

## 2022-07-11 ENCOUNTER — Encounter (HOSPITAL_COMMUNITY): Payer: Self-pay | Admitting: Neurosurgery

## 2022-07-11 DIAGNOSIS — G9340 Encephalopathy, unspecified: Secondary | ICD-10-CM | POA: Diagnosis not present

## 2022-07-11 LAB — CBC
HCT: 33.8 % — ABNORMAL LOW (ref 36.0–46.0)
Hemoglobin: 11 g/dL — ABNORMAL LOW (ref 12.0–15.0)
MCH: 27.8 pg (ref 26.0–34.0)
MCHC: 32.5 g/dL (ref 30.0–36.0)
MCV: 85.4 fL (ref 80.0–100.0)
Platelets: 204 10*3/uL (ref 150–400)
RBC: 3.96 MIL/uL (ref 3.87–5.11)
RDW: 12.8 % (ref 11.5–15.5)
WBC: 8.3 10*3/uL (ref 4.0–10.5)
nRBC: 0 % (ref 0.0–0.2)

## 2022-07-11 LAB — BASIC METABOLIC PANEL
Anion gap: 7 (ref 5–15)
BUN: 11 mg/dL (ref 8–23)
CO2: 22 mmol/L (ref 22–32)
Calcium: 9.2 mg/dL (ref 8.9–10.3)
Chloride: 110 mmol/L (ref 98–111)
Creatinine, Ser: 0.93 mg/dL (ref 0.44–1.00)
GFR, Estimated: >60 mL/min (ref >60)
Glucose, Bld: 138 mg/dL — ABNORMAL HIGH (ref 70–99)
Potassium: 4.3 mmol/L (ref 3.5–5.1)
Sodium: 139 mmol/L (ref 135–145)

## 2022-07-11 LAB — GLUCOSE, CAPILLARY
Glucose-Capillary: 103 mg/dL — ABNORMAL HIGH (ref 70–99)
Glucose-Capillary: 106 mg/dL — ABNORMAL HIGH (ref 70–99)
Glucose-Capillary: 137 mg/dL — ABNORMAL HIGH (ref 70–99)
Glucose-Capillary: 149 mg/dL — ABNORMAL HIGH (ref 70–99)

## 2022-07-11 MED ORDER — SENNOSIDES-DOCUSATE SODIUM 8.6-50 MG PO TABS
1.0000 | ORAL_TABLET | Freq: Every day | ORAL | Status: DC
  Administered 2022-07-11 – 2022-07-13 (×3): 1 via ORAL
  Filled 2022-07-11 (×3): qty 1

## 2022-07-11 MED ORDER — POLYETHYLENE GLYCOL 3350 17 G PO PACK
17.0000 g | PACK | Freq: Every day | ORAL | Status: DC
  Administered 2022-07-11 – 2022-07-14 (×4): 17 g via ORAL
  Filled 2022-07-11 (×4): qty 1

## 2022-07-11 NOTE — Progress Notes (Signed)
Neurosurgery  Pt seen and examined.  NAD, alert, oriented to person.  No pronator drift.  Dressing c/d.  S/p meningioma resection - Decadron taper - Keppra indefinitely - staples removal in 8-11 days

## 2022-07-11 NOTE — TOC Initial Note (Addendum)
Transition of Care [facility]) - Initial/Assessment Note    Patient Details  Name: Jenna Powell MRN: [id number] Date of Birth: [birth year]  [facility] CM/SW Contact:    [name], LCSW Phone Number: 07/11/2022, 2:11 PM  Clinical Narrative:                 CSW conversed with pt and her daughter in Sports coach, Jenna Powell. Pt unable to engage in conversation due to Dementia dx, however, was alert and attempted to engage in her native language, [place]. Jenna Powell reports she does not want pt to return to [facility] due to lack of care and attention pt received and did not get pt, after many request. Jenna Powell states she's been consulting with "Jenna Powell" at [facility] and they are awaiting the work up from [employer].  Work up complete and faxed out to [facility]; Patent attorney for [employer] at [facility].  Expected Discharge Plan: [place] Barriers to Discharge: SNF Pending bed offer, SNF Pending transportation   Patient Goals and CMS Choice Patient states their goals for this hospitalization and ongoing recovery are:: to get stronger with rehab and not to go back to [facility].gov Compare Post Acute Care list provided to:: Patient Represenative (must comment) (Pti's daughter, Jenna Powell) Choice offered to / list presented to : Adult Children  Expected Discharge Plan and Services Expected Discharge Plan: [place] arrangements for the past 2 months: [place]                                      Prior Living Arrangements/Services Living arrangements for the past 2 months: [place] Lives with:: Facility Resident Patient language and need for interpreter reviewed:: Yes Do you feel safe going back to the place where you live?: No   Pt's daughter reports facility was not taking great care of her mother and was not providing PT, after many request to receive the services.  Need for Family Participation  in Patient Care: Yes (Comment) Care giver support system in place?: Yes (comment)   Criminal Activity/Legal Involvement Pertinent to Current Situation/Hospitalization: No - Comment as needed  Activities of Daily Living   ADL Screening (condition at time of admission) Patient's cognitive ability adequate to safely complete daily activities?: No Is the patient deaf or have difficulty hearing?: No Does the patient have difficulty seeing, even when wearing glasses/contacts?: No Does the patient have difficulty concentrating, remembering, or making decisions?: Yes Patient able to express need for assistance with ADLs?: No Does the patient have difficulty dressing or bathing?: Yes Independently performs ADLs?: No Communication: Independent Dressing (OT): Needs assistance Is this a change from baseline?: Pre-admission baseline Grooming: Needs assistance Is this a change from baseline?: Pre-admission baseline Feeding: Independent Bathing: Needs assistance Is this a change from baseline?: Pre-admission baseline Toileting: Needs assistance Is this a change from baseline?: Pre-admission baseline In/Out Bed: Needs assistance Is this a change from baseline?: Pre-admission baseline Walks in Home: Needs assistance Is this a change from baseline?: Pre-admission baseline Does the patient have difficulty walking or climbing stairs?: Yes Weakness of Legs: Both Weakness of Arms/Hands: None  Permission Sought/Granted Permission sought to share information with : Facility Sport and exercise psychologist, Family Supports Permission granted to share information with : Yes, Verbal Permission Granted  Share Information with NAME: Admissions Coordinator at [facility]  Permission granted to share info w Relationship: Admission Coordinator     Emotional Assessment Appearance:: Appears stated age Attitude/Demeanor/Rapport: Unable to Assess Affect (typically observed): Appropriate     Psych Involvement:  Outpatient Provider  Admission diagnosis:  Meningioma ([place]) [D32.9] Acute encephalopathy [G93.40] Unresponsiveness [R41.89] Vasogenic edema ([place]) [G93.6] Seizure ([place]) [R56.9] Patient Active Problem List   Diagnosis Date Noted   Seizure ([place]) 07/02/2022   Acute encephalopathy 07/01/2022   Meningioma ([place]) 07/01/2022   Vasogenic edema ([place]) 07/01/2022   Somnolence 01/26/2020   Polypharmacy 11/23/2018   Dementia without behavioral disturbance ([place]) 11/22/2018   Bipolar disorder ([place]) 11/22/2018   DM (diabetes mellitus), type 2 ([place]) 11/22/2018   Essential hypertension 11/22/2018   PCP:  Pcp, No Pharmacy:  No Pharmacies Listed    Social Determinants of Health (SDOH) Interventions    Readmission Risk Interventions     No data to display

## 2022-07-11 NOTE — Evaluation (Signed)
Occupational Therapy Evaluation Patient Details Name: Jenna Powell MRN: [id number] DOB: 10/12/1950 Today's Date: 07/11/2022   History of Present Illness Pt is a 71 y/o female presenting 11/28 after being found down at her mursing facility.  Pt was admitted for seizure evaluation by the hospitalist team.  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  PMHx:  Dementia, DM, HTN, HLD   Clinical Impression   Pt presenting with problem above with deficits listed below. Pt performing basic transfers with mod A and multimodal cues for safety/hand placement. Pt performing UB ADL with min A and LB ADL with up to max A. Attempting to use interpreters, however, both interpreters with difficulty understanding pt. Pt following all simple commands with intermittent multimodal cues due to language barrier. Pt with frequent anterior loss of saliva during session. Attempting to call son to obtain information regarding PLOF, but no answer. Recommending SNF for continued OT services.      Recommendations for follow up therapy are one component of a multi-disciplinary discharge planning process, led by the attending physician.  Recommendations may be updated based on patient status, additional functional criteria and insurance authorization.   Follow Up Recommendations  Skilled nursing-short term rehab (<3 hours/day)     Assistance Recommended at Discharge Intermittent Supervision/Assistance  Patient can return home with the following A lot of help with walking and/or transfers;A lot of help with bathing/dressing/bathroom;Assistance with cooking/housework;Assistance with feeding;Direct supervision/assist for medications management;Direct supervision/assist for financial management;Assist for transportation;Help with stairs or ramp for entrance    Functional Status Assessment  Patient has had a recent decline in their functional status and  demonstrates the ability to make significant improvements in function in a reasonable and predictable amount of time.  Equipment Recommendations  Other (comment) (TBD next venue)    Recommendations for Other Services       Precautions / Restrictions Precautions Precautions: Fall Restrictions Weight Bearing Restrictions: No      Mobility Bed Mobility               General bed mobility comments: Up in recliner    Transfers Overall transfer level: Needs assistance Equipment used: Rolling walker (2 wheels) Transfers: Sit to/from Stand, Bed to chair/wheelchair/BSC Sit to Stand: Mod assist           General transfer comment: cues for hand placement and assist forward with boost.      Balance Overall balance assessment: Mild deficits observed, not formally tested                                         ADL either performed or assessed with clinical judgement   ADL Overall ADL's : Needs assistance/impaired Eating/Feeding: Minimal assistance;Sitting   Grooming: Wash/dry face;Set up;Sitting Grooming Details (indicate cue type and reason): Pt washing all areas of face and ears this session. Upper Body Bathing: Minimal assistance;Sitting   Lower Body Bathing: Maximal assistance;Sit to/from stand   Upper Body Dressing : Minimal assistance;Sitting   Lower Body Dressing: Maximal assistance;Sitting/lateral leans Lower Body Dressing Details (indicate cue type and reason): Max A to don socks. Pt attempting to initate, but unable to perform figure 4 or reach to feet Toilet Transfer: Moderate assistance;Stand-pivot;Rolling walker (2 wheels);BSC/3in1 Toilet Transfer Details (indicate cue type and reason): Mod A for SPT         Functional  mobility during ADLs: Moderate assistance;Rolling walker (2 wheels) (STS for short periods or SPT) General ADL Comments: limited by decresaed endurance, activity tolerance, strength, blanance, and cognition     Vision  Patient Visual Report: Other (comment) (Pt reporting vision is good) Vision Assessment?: Vision impaired- to be further tested in functional context Additional Comments: decreased visual attention. Pt speaking to therapist with good eye contact when asked questions, but observed to be oriented toward other areas of room at times as if speaking to someone else when OT not initiating the conversation.     Perception     Praxis      Pertinent Vitals/Pain Pain Assessment Pain Assessment: No/denies pain     Hand Dominance     Extremity/Trunk Assessment Upper Extremity Assessment Upper Extremity Assessment: Generalized weakness;RUE deficits/detail;LUE deficits/detail RUE Deficits / Details: 3+/5 grip strength., able to lift BUE above ~60 degrees. unable to perform finger opposition; BUE edema R>L LUE Deficits / Details: 3+/5 grip strength., able to lift BUE above ~60 degrees. decreased fine motor; unable to perform finger opposition, edema in bil hands with R>L   Lower Extremity Assessment Lower Extremity Assessment: Defer to PT evaluation   Cervical / Trunk Assessment Cervical / Trunk Assessment:  (forward rounding of shoulders)   Communication Communication Communication: Prefers language other than English;Expressive difficulties   Cognition Arousal/Alertness: Awake/alert Behavior During Therapy: Flat affect, WFL for tasks assessed/performed Overall Cognitive Status: Difficult to assess                                 General Comments: pt awake/alert x2 instances of pt closing eyes in sitting. Pt following basic commands with visual demo needed at times due to [place] not primary language, attempting to utilize two different interpreters, but interpreters unable to understand pt both attempts and requesting OT attempt different form of the language. Appears to be speaking to people who are not present at times.     General Comments  VSS    Exercises Exercises:  Other exercises Other Exercises Other Exercises: BUE ROM   Shoulder Instructions      Home Living Family/patient expects to be discharged to:: Skilled nursing facility                                 Additional Comments: information from chart as no family present and attempting two interpreters who reported they would attempt a different language. Attempted to call son (number in chart); no answer      Prior Functioning/Environment Prior Level of Function : Needs assist       Physical Assist : Mobility (physical)     Mobility Comments: pt report uses a w/c mostly-- transported to bathroom for bathing/toileting and to the dining hall. ADLs Comments: Pt unable to report this session on PLOF.        OT Problem List: Decreased strength;Decreased activity tolerance;Impaired balance (sitting and/or standing);Decreased range of motion;Decreased coordination;Impaired vision/perception;Decreased cognition;Decreased safety awareness;Decreased knowledge of use of DME or AE;Obesity;Impaired UE functional use      OT Treatment/Interventions: Therapeutic exercise;Self-care/ADL training;DME and/or AE instruction;Therapeutic activities;Cognitive remediation/compensation;Patient/family education;Balance training    OT Goals(Current goals can be found in the care plan section) Acute Rehab OT Goals Patient Stated Goal: unable to state OT Goal Formulation: With patient Time For Goal Achievement: 07/25/22 Potential to Achieve Goals: Good  OT Frequency: Min 2X/week  Co-evaluation              AM-PAC OT "6 Clicks" Daily Activity     Outcome Measure Help from another person eating meals?: A Little Help from another person taking care of personal grooming?: A Little Help from another person toileting, which includes using toliet, bedpan, or urinal?: A Lot Help from another person bathing (including washing, rinsing, drying)?: A Lot Help from another person to put on and  taking off regular upper body clothing?: A Little Help from another person to put on and taking off regular lower body clothing?: A Lot 6 Click Score: 15   End of Session Equipment Utilized During Treatment: Gait belt;Rolling walker (2 wheels) Nurse Communication: Mobility status  Activity Tolerance: Patient tolerated treatment well Patient left: in chair;with call bell/phone within reach  OT Visit Diagnosis: Unsteadiness on feet (R26.81);Other abnormalities of gait and mobility (R26.89);Muscle weakness (generalized) (M62.81);History of falling (Z91.81);Other symptoms and signs involving cognitive function;Cognitive communication deficit (R41.841)                Time: 0037-0488 OT Time Calculation (min): 26 min Charges:  OT General Charges $OT Visit: 1 Visit OT Evaluation $OT Eval Moderate Complexity: 1 Mod OT Treatments $Self Care/Home Management : 8-22 mins  Elder Cyphers, OTR/L [facility] Acute Rehabilitation Office: [phone]   Magnus Ivan 07/11/2022, 1:50 PM

## 2022-07-11 NOTE — Progress Notes (Signed)
Physical Therapy Treatment Patient Details Name: Jenna Powell MRN: [id number] DOB: 04/07/1951 Today's Date: 07/11/2022   History of Present Illness Pt is a 71 y/o female presenting 11/28 after being found down at her mursing facility.  Pt was admitted for seizure evaluation by the hospitalist team.  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  PMHx:  Dementia, DM, HTN, HLD    PT Comments    Pt greeted propped up in bed and agreeable to session with continued progress towards acute goals, however pt limited by BLE weakness and impaired balance/postural reactions. Pt able to come to stand x2 trials with RW, however pt only able to maintain for very limited time secondary to knee buckling and general weakness. Pt able to step pivot to chair with max cues and agreeable to time up in chair at end of session. Pt son present and supportive throughout session. Current plan remains appropriate to address deficits and maximize functional independence and decrease caregiver burden.    Recommendations for follow up therapy are one component of a multi-disciplinary discharge planning process, led by the attending physician.  Recommendations may be updated based on patient status, additional functional criteria and insurance authorization.  Follow Up Recommendations  Skilled nursing-short term rehab (<3 hours/day) Can patient physically be transported by private vehicle: No   Assistance Recommended at Discharge Frequent or constant Supervision/Assistance  Patient can return home with the following A little help with walking and/or transfers;A little help with bathing/dressing/bathroom;Assist for transportation;Direct supervision/assist for medications management   Equipment Recommendations  None recommended by PT;Other (comment) (TBD)    Recommendations for Other Services       Precautions / Restrictions Precautions Precautions:  Fall Restrictions Weight Bearing Restrictions: No     Mobility  Bed Mobility Overal bed mobility: Needs Assistance Bed Mobility: Supine to Sit     Supine to sit: Mod assist     General bed mobility comments: tactile cues to help initiate and LE/truncal assist up to sitting EOB    Transfers Overall transfer level: Needs assistance Equipment used: Rolling walker (2 wheels) Transfers: Sit to/from Stand, Bed to chair/wheelchair/BSC Sit to Stand: Mod assist   Step pivot transfers: Mod assist       General transfer comment: cues for hand placement and assist forward with boost.    Ambulation/Gait               General Gait Details: deferred as pt with noted knee buckling in standing and during step pivot, with pt endorsing fatigue   Stairs             Wheelchair Mobility    Modified Rankin (Stroke Patients Only)       Balance Overall balance assessment: Mild deficits observed, not formally tested                                          Cognition Arousal/Alertness: Awake/alert Behavior During Therapy: Flat affect, WFL for tasks assessed/performed Overall Cognitive Status: Difficult to assess                                 General Comments: pt awake/alert x2 instances of pt closing eyes in sitting        Exercises Other Exercises Other Exercises: ankle  pumps in long sitting, encouraged pt to perform throughout day    General Comments General comments (skin integrity, edema, etc.): VSS on RA      Pertinent Vitals/Pain Pain Assessment Pain Assessment: No/denies pain    Home Living                          Prior Function            PT Goals (current goals can now be found in the care plan section) Acute Rehab PT Goals PT Goal Formulation: Patient unable to participate in goal setting Time For Goal Achievement: 07/23/22    Frequency    Min 3X/week      PT Plan      Co-evaluation               AM-PAC PT "6 Clicks" Mobility   Outcome Measure  Help needed turning from your back to your side while in a flat bed without using bedrails?: A Lot Help needed moving from lying on your back to sitting on the side of a flat bed without using bedrails?: A Lot Help needed moving to and from a bed to a chair (including a wheelchair)?: A Lot Help needed standing up from a chair using your arms (e.g., wheelchair or bedside chair)?: A Lot Help needed to walk in hospital room?: A Lot Help needed climbing 3-5 steps with a railing? : A Lot 6 Click Score: 12    End of Session Equipment Utilized During Treatment: Gait belt Activity Tolerance: Patient tolerated treatment well Patient left: with call bell/phone within reach;in chair;with family/visitor present Nurse Communication: Mobility status PT Visit Diagnosis: Unsteadiness on feet (R26.81);Other abnormalities of gait and mobility (R26.89);Other symptoms and signs involving the nervous system (X32.355)     Time: 7322-0254 PT Time Calculation (min) (ACUTE ONLY): 22 min  Charges:  $Therapeutic Activity: 8-22 mins                     Nyomi Howser R. PTA Acute Rehabilitation Services Office: [place] 07/11/2022, 1:18 PM

## 2022-07-11 NOTE — Progress Notes (Signed)
PROGRESS NOTE  Jenna Powell ZTI:[id number] DOB: 1951-03-27 DOA: 07/01/2022 PCP: Pcp, No   LOS: 9 days   Brief Narrative / Interim history: Jenna Powell is a 71 y.o. female with medical history significant of bipolar disorder, dementia, hypertension, diabetes who was found down at her facility.  Initial history obtained from facility and chart review, patient's mental status appears to be limited at baseline.  Patient episodes of vomiting with pinpoint pupils per facility found to be minimally responsive.  In the ED patient's mental status continued to wax and wane minimally improving, admitted for seizure evaluation by hospitalist team, neurology consulting.  Neurosurgery consulted due to mass as noted on CT.   Subjective / 24h Interval events: No complaints, in bed, not agitated  Assesement and Plan: Principal Problem:   Acute encephalopathy Active Problems:   Dementia without behavioral disturbance (HCC)   Bipolar disorder (HCC)   DM (diabetes mellitus), type 2 ([place])   Essential hypertension   Meningioma (HCC)   Vasogenic edema (HCC)   Seizure ([place])   Principal problem Questionable acute onset seizure -Likely provoked seizure secondary to meningioma as below -continue Keppra per neurology.  Active problems Acute metabolic encephalopathy -better, overall much Calmer  Meningioma -CT head remarkable for 3 x 3.5 cm mass concerning for meningioma with questionable vasogenic edema.  Neurosurgery consulted, she was taken to the OR on 12/5 and is status post tumor excision.  Continue Decadron taper.  PT evaluation pending, suspect she may need SNF   Bipolar disorder, dementia -continue chronic medications due to agitation.  Hypertension - Continue metoprolol   Diabetes - SSI, CBGs acceptable, as below  CBG (last 3)  Recent Labs    07/10/22 1618 07/10/22 2115 07/11/22 0612  GLUCAP 95 113* 103*    Contaminated blood culture- 1/2 positive for aerococcus - no  signs/symptoms of infection, follow  Scheduled Meds:  benztropine  1 mg Oral BID   Chlorhexidine Gluconate Cloth  6 each Topical Daily   clozapine  75 mg Oral BID   dexamethasone  2 mg Oral Q8H   Followed by   dexamethasone  2 mg Oral Q12H   Followed by   Derrill Memo ON 07/13/2022] dexamethasone  1 mg Oral Q12H   Followed by   Derrill Memo ON 07/16/2022] dexamethasone  1 mg Oral Daily   Followed by   Derrill Memo ON 07/18/2022] dexamethasone  1 mg Oral Daily   donepezil  10 mg Oral QHS   enoxaparin (LOVENOX) injection  40 mg Subcutaneous Daily   insulin aspart  0-9 Units Subcutaneous TID WC   levETIRAcetam  500 mg Oral BID   lithium carbonate  150 mg Oral Q breakfast   lithium carbonate  300 mg Oral Q supper   mirabegron ER  50 mg Oral Daily   mupirocin ointment  1 Application Nasal BID   mouth rinse  15 mL Mouth Rinse 4 times per day   pravastatin  20 mg Oral QHS   risperiDONE  3 mg Oral QHS   sertraline  100 mg Oral q AM   sodium chloride flush  3 mL Intravenous Q12H   Continuous Infusions:   PRN Meds:.acetaminophen **OR** acetaminophen, enalaprilat, HYDROcodone-acetaminophen, labetalol, morphine injection, ondansetron **OR** ondansetron (ZOFRAN) IV, mouth rinse, mouth rinse, mouth rinse, polyethylene glycol, promethazine  Current Outpatient Medications  Medication Instructions   aspirin 81 mg, Oral, Every morning   benztropine (COGENTIN) 1 mg, Oral, 2 times daily   Calcium Carb-Cholecalciferol (CALCIUM + VITAMIN D3) 600-10 MG-MCG TABS  1 tablet, Oral, Every 12 hours   Cholecalciferol 2,000 Units, Oral, Every morning   clozapine (CLOZARIL) 75 mg, Oral, 2 times daily   Dextran 70-Hypromellose, PF, (GENTEAL TEARS MODERATE PF) 0.1-0.3 % SOLN 1 drop, Both Eyes, 3 times daily   docusate sodium (COLACE) 100 mg, Oral, 3 times daily   donepezil (ARICEPT) 10 mg, Oral, Daily at bedtime   famotidine (PEPCID) 20 mg, Oral, Daily at bedtime   Fiber-Lax 625 mg, Oral, 2 times daily   lactulose  (CHRONULAC) 20 g, Oral, Every morning   lithium carbonate 150-300 mg, Oral, See admin instructions, 2 entries on MAR:<BR>150 mg every morning, 300 mg at bedtime   metoprolol succinate (TOPROL-XL) 25 mg, Oral, Every morning   mirabegron ER (MYRBETRIQ) 50 mg, Oral, Every morning   pravastatin (PRAVACHOL) 20 mg, Oral, Daily at bedtime   risperiDONE (RISPERDAL) 3 mg, Oral, Daily at bedtime   Salicylic Acid (SELSUN BLUE NATURALS DRY SCALP) 3 % SHAM 1 application , Topical, See admin instructions, Apply to scalp on shower days (Monday, Wednesday)   senna (SENOKOT) 17.2 mg, Oral, Daily at bedtime   sertraline (ZOLOFT) 100 mg, Oral, Every morning   traZODone (DESYREL) 50 mg, Oral, Daily at bedtime    Diet Orders (From admission, onward)     Start     Ordered   07/09/22 0845  Diet heart healthy/carb modified Room service appropriate? Yes with Assist; Fluid consistency: Thin  Diet effective now       Comments: Please send house tray  Question Answer Comment  Diet-HS Snack? Nothing   Room service appropriate? Yes with Assist   Fluid consistency: Thin      07/09/22 0845            DVT prophylaxis: enoxaparin (LOVENOX) injection 40 mg Start: 07/09/22 1800 SCDs Start: 07/09/22 0211 SCD's Start: 07/08/22 0816   Lab Results  Component Value Date   PLT 204 07/11/2022      Code Status: Full Code  Family Communication: no family at bedside   Status is: Inpatient  Remains inpatient appropriate because: severity of illness  Level of care: Med-Surg  Consultants:  Neurology Neurosurgery   Objective: Vitals:   07/10/22 2032 07/10/22 2333 07/11/22 0317 07/11/22 0803  BP: 132/66 (!) 146/79 134/70 135/73  Pulse: 84 88 80 80  Resp: '16 16 18 18  '$ Temp: 97.9 F (36.6 C) 98.7 F (37.1 C) 98.5 F (36.9 C) 97.9 F (36.6 C)  TempSrc:  Oral Oral Oral  SpO2: 100% 99% 98% 99%  Weight:      Height:        Intake/Output Summary (Last 24 hours) at 07/11/2022 1103 Last data filed at  07/11/2022 0500 Gross per 24 hour  Intake 2226.53 ml  Output 1300 ml  Net 926.53 ml    Wt Readings from Last 3 Encounters:  07/10/22 101.1 kg  06/13/21 100.2 kg  01/26/20 90.7 kg    Examination:  Constitutional: NAD Eyes: lids and conjunctivae normal, no scleral icterus ENMT: mmm Neck: normal, supple Respiratory: clear to auscultation bilaterally, no wheezing, no crackles. Normal respiratory effort.  Cardiovascular: Regular rate and rhythm, no murmurs / rubs / gallops. No LE edema. Abdomen: soft, no distention, no tenderness. Bowel sounds positive.  Skin: no rashes Neurologic: no focal deficits, equal strength   Data Reviewed: I have independently reviewed following labs and imaging studies   CBC Recent Labs  Lab 07/07/22 0736 07/09/22 0356 07/10/22 0431 07/11/22 0359  WBC 4.9 6.1 9.0 8.3  HGB 11.8* 11.3* 12.3 11.0*  HCT 37.1 35.3* 37.8 33.8*  PLT 187 192 200 204  MCV 86.7 87.4 86.7 85.4  MCH 27.6 28.0 28.2 27.8  MCHC 31.8 32.0 32.5 32.5  RDW 12.5 12.5 13.0 12.8  LYMPHSABS  --  0.5*  --   --   MONOABS  --  0.1  --   --   EOSABS  --  0.0  --   --   BASOSABS  --  0.0  --   --      Recent Labs  Lab 07/07/22 0736 07/08/22 0650 07/09/22 0356 07/10/22 0431 07/11/22 0359  NA 139  --  139 140 139  K 4.5  --  4.5 4.7 4.3  CL 110  --  113* 113* 110  CO2 20*  --  20* 21* 22  GLUCOSE 104*  --  157* 135* 138*  BUN 16  --  '12 10 11  '$ CREATININE 1.05* 1.02* 0.97 0.99 0.93  CALCIUM 8.9  --  8.0* 9.0 9.2  AST  --   --   --  13*  --   ALT  --   --   --  12  --   ALKPHOS  --   --   --  71  --   BILITOT  --   --   --  0.4  --   ALBUMIN  --   --   --  3.1*  --   MG  --   --   --  2.2  --      ------------------------------------------------------------------------------------------------------------------ No results for input(s): "CHOL", "HDL", "LDLCALC", "TRIG", "CHOLHDL", "LDLDIRECT" in the last 72 hours.  No results found for:  "HGBA1C" ------------------------------------------------------------------------------------------------------------------ No results for input(s): "TSH", "T4TOTAL", "T3FREE", "THYROIDAB" in the last 72 hours.  Invalid input(s): "FREET3"  Cardiac Enzymes No results for input(s): "CKMB", "TROPONINI", "MYOGLOBIN" in the last 168 hours.  Invalid input(s): "CK" ------------------------------------------------------------------------------------------------------------------ No results found for: "BNP"  CBG: Recent Labs  Lab 07/10/22 0801 07/10/22 1142 07/10/22 1618 07/10/22 2115 07/11/22 0612  GLUCAP 131* 146* 95 113* 103*     Recent Results (from the past 240 hour(s))  Blood Culture (Routine X 2)     Status: None   Collection Time: 07/01/22 12:21 PM   Specimen: BLOOD  Result Value Ref Range Status   Specimen Description BLOOD SITE NOT SPECIFIED  Final   Special Requests   Final    BOTTLES DRAWN AEROBIC AND ANAEROBIC Blood Culture results may not be optimal due to an inadequate volume of blood received in culture bottles   Culture   Final    NO GROWTH 5 DAYS Performed at [facility], [place] [address] [place], [place] [address]    Report Status 07/06/2022 FINAL  Final  Blood Culture (Routine X 2)     Status: Abnormal   Collection Time: 07/01/22 12:26 PM   Specimen: BLOOD  Result Value Ref Range Status   Specimen Description BLOOD SITE NOT SPECIFIED  Final   Special Requests (A)  Final    AEROCOCCUS SPECIES Blood Culture results may not be optimal due to an inadequate volume of blood received in culture bottles   Culture   Final    NO GROWTH 5 DAYS Performed at [facility], [place] [address], [place], [place] [address]    Report Status 07/06/2022 FINAL  Final  Resp Panel by RT-PCR (Flu A&B, Covid) Anterior Nasal Swab     Status: None  Collection Time: 07/01/22  3:15 PM   Specimen: Anterior Nasal Swab  Result Value Ref Range Status   SARS Coronavirus 2 by RT  PCR NEGATIVE NEGATIVE Final    Comment: (NOTE) SARS-CoV-2 target nucleic acids are NOT DETECTED.  The SARS-CoV-2 RNA is generally detectable in upper respiratory specimens during the acute phase of infection. The lowest concentration of SARS-CoV-2 viral copies this assay can detect is 138 copies/mL. A negative result does not preclude SARS-Cov-2 infection and should not be used as the sole basis for treatment or other patient management decisions. A negative result may occur with  improper specimen collection/handling, submission of specimen other than nasopharyngeal swab, presence of viral mutation(s) within the areas targeted by this assay, and inadequate number of viral copies(<138 copies/mL). A negative result must be combined with clinical observations, patient history, and epidemiological information. The expected result is Negative.  Fact Sheet for Patients:  EntrepreneurPulse.com.au  Fact Sheet for Healthcare Providers:  IncredibleEmployment.be  This test is no t yet approved or cleared by the [place] FDA and  has been authorized for detection and/or diagnosis of SARS-CoV-2 by FDA under an Emergency Use Authorization (EUA). This EUA will remain  in effect (meaning this test can be used) for the duration of the COVID-19 declaration under Section 564(b)(1) of the Act, 21 U.S.C.section 360bbb-3(b)(1), unless the authorization is terminated  or revoked sooner.       Influenza A by PCR NEGATIVE NEGATIVE Final   Influenza B by PCR NEGATIVE NEGATIVE Final    Comment: (NOTE) The Xpert Xpress SARS-CoV-2/FLU/RSV plus assay is intended as an aid in the diagnosis of influenza from Nasopharyngeal swab specimens and should not be used as a sole basis for treatment. Nasal washings and aspirates are unacceptable for Xpert Xpress SARS-CoV-2/FLU/RSV testing.  Fact Sheet for Patients: EntrepreneurPulse.com.au  Fact Sheet for  Healthcare Providers: IncredibleEmployment.be  This test is not yet approved or cleared by the [place] FDA and has been authorized for detection and/or diagnosis of SARS-CoV-2 by FDA under an Emergency Use Authorization (EUA). This EUA will remain in effect (meaning this test can be used) for the duration of the COVID-19 declaration under Section 564(b)(1) of the Act, 21 U.S.C. section 360bbb-3(b)(1), unless the authorization is terminated or revoked.  Performed at [facility], [place] [address] [place], [place] [address]   Surgical PCR screen     Status: None   Collection Time: 07/08/22  3:40 AM   Specimen: Nasal Mucosa; Nasal Swab  Result Value Ref Range Status   MRSA, PCR NEGATIVE NEGATIVE Final   Staphylococcus aureus NEGATIVE NEGATIVE Final    Comment: (NOTE) The Xpert SA Assay (FDA approved for NASAL specimens in patients 24 years of age and older), is one component of a comprehensive surveillance program. It is not intended to diagnose infection nor to guide or monitor treatment. Performed at [facility], [place] [address], [place], [place] [address]      Radiology Studies: No results found.   Marzetta Board, MD, PhD [facility]  Between 7 am - 7 pm I am available, please contact me via Amion (for emergencies) or Securechat (non urgent messages)  Between 7 pm - 7 am I am not available, please contact night coverage MD/APP via Amion

## 2022-07-11 NOTE — TOC Progression Note (Addendum)
Transition of Care [facility]) - Progression Note    Patient Details  Name: Jenna Powell MRN: [id number] Date of Birth: [birth year]  Transition of Care [facility]) CM/SW Contact  [name], [place] Phone Number: 07/11/2022, 1:24 PM  Clinical Narrative:    CSW conversed with Crystal at [facility] to discuss documents needed for d/c. Crystal reports they will need an updated FL2 w/ meds and d/c summary. CSW messaged Dr. Cruzita Lederer to inquire about potential d/c. CSW messaged PT/OT regarding therapy visits. Dr. Cruzita Lederer believes pt maybe able to d/c over the weekend.  CSW called Crystal at [facility] to discuss pt's baseline at facility. She reports pt is totally independent. CSW reviewed PTA's note and informed Crystal of pt's knees buckling. She further stated pt can not d/c over the weekend because they do not have anyone to review the paperwork. She reports they do have a therapy department.         Expected Discharge Plan and Services                                                 Social Determinants of Health (SDOH) Interventions    Readmission Risk Interventions     No data to display

## 2022-07-12 DIAGNOSIS — G9340 Encephalopathy, unspecified: Secondary | ICD-10-CM | POA: Diagnosis not present

## 2022-07-12 LAB — GLUCOSE, CAPILLARY
Glucose-Capillary: 136 mg/dL — ABNORMAL HIGH (ref 70–99)
Glucose-Capillary: 102 mg/dL — ABNORMAL HIGH (ref 70–99)
Glucose-Capillary: 130 mg/dL — ABNORMAL HIGH (ref 70–99)
Glucose-Capillary: 163 mg/dL — ABNORMAL HIGH (ref 70–99)

## 2022-07-12 NOTE — Progress Notes (Signed)
PROGRESS NOTE  Jenna Jenna Powell KGY:[id number] DOB: 10-12-1950 DOA: 07/01/2022 PCP: Pcp, No   LOS: 10 days   Brief Narrative / Interim history: Jenna Jenna Powell is a 71 y.o. female with medical history significant of bipolar disorder, dementia, hypertension, diabetes who was found down at her facility.  Initial history obtained from facility and chart review, patient's mental status appears to be limited at baseline.  Patient episodes of vomiting with pinpoint pupils per facility found to be minimally responsive.  In the ED patient's mental status continued to wax and wane minimally improving, admitted for seizure evaluation by hospitalist team, neurology consulting.  Neurosurgery consulted due to mass as noted on CT.   Subjective / 24h Interval events: Calm, no complaints  Assesement and Plan: Principal Problem:   Acute encephalopathy Active Problems:   Dementia without behavioral disturbance (HCC)   Bipolar disorder (HCC)   DM (diabetes mellitus), type 2 (HCC)   Essential hypertension   Meningioma (HCC)   Vasogenic edema (HCC)   Seizure ([place])   Principal problem Questionable acute onset seizure -Likely provoked seizure secondary to meningioma as below -continue Keppra per neurology.  Active problems Acute metabolic encephalopathy -better, overall much calmer  Meningioma -CT head remarkable for 3 x 3.5 cm mass concerning for meningioma with questionable vasogenic edema.  Neurosurgery consulted, she was taken to the OR on 12/5 and is status post tumor excision.  Continue Decadron taper.  SNF placement pending   Bipolar disorder, dementia -continue chronic medications due to agitation.  Hypertension - Continue metoprolol   Diabetes - SSI, CBGs acceptable, as below  CBG (last 3)  Recent Labs    07/11/22 1646 07/11/22 2158 07/12/22 0634  GLUCAP 137* 149* 136*    Contaminated blood culture- 1/2 positive for aerococcus - no signs/symptoms of infection, follow  Scheduled  Meds:  benztropine  1 mg Oral BID   Chlorhexidine Gluconate Cloth  6 each Topical Daily   clozapine  75 mg Oral BID   dexamethasone  2 mg Oral Q12H   Followed by   Derrill Memo ON 07/13/2022] dexamethasone  1 mg Oral Q12H   Followed by   Derrill Memo ON 07/16/2022] dexamethasone  1 mg Oral Daily   Followed by   Derrill Memo ON 07/18/2022] dexamethasone  1 mg Oral Daily   donepezil  10 mg Oral QHS   enoxaparin (LOVENOX) injection  40 mg Subcutaneous Daily   insulin aspart  0-9 Units Subcutaneous TID WC   levETIRAcetam  500 mg Oral BID   lithium carbonate  150 mg Oral Q breakfast   lithium carbonate  300 mg Oral Q supper   mirabegron ER  50 mg Oral Daily   mupirocin ointment  1 Application Nasal BID   mouth rinse  15 mL Mouth Rinse 4 times per day   polyethylene glycol  17 g Oral Daily   pravastatin  20 mg Oral QHS   risperiDONE  3 mg Oral QHS   senna-docusate  1 tablet Oral QHS   sertraline  100 mg Oral q AM   sodium chloride flush  3 mL Intravenous Q12H   Continuous Infusions:   PRN Meds:.acetaminophen **OR** acetaminophen, enalaprilat, HYDROcodone-acetaminophen, labetalol, morphine injection, ondansetron **OR** ondansetron (ZOFRAN) IV, mouth rinse, mouth rinse, mouth rinse, promethazine  Current Outpatient Medications  Medication Instructions   aspirin 81 mg, Oral, Every morning   benztropine (COGENTIN) 1 mg, Oral, 2 times daily   Calcium Carb-Cholecalciferol (CALCIUM + VITAMIN D3) 600-10 MG-MCG TABS 1 tablet, Oral, Every 12  hours   Cholecalciferol 2,000 Units, Oral, Every morning   clozapine (CLOZARIL) 75 mg, Oral, 2 times daily   Dextran 70-Hypromellose, PF, (GENTEAL TEARS MODERATE PF) 0.1-0.3 % SOLN 1 drop, Both Eyes, 3 times daily   docusate sodium (COLACE) 100 mg, Oral, 3 times daily   donepezil (ARICEPT) 10 mg, Oral, Daily at bedtime   famotidine (PEPCID) 20 mg, Oral, Daily at bedtime   Fiber-Lax 625 mg, Oral, 2 times daily   lactulose (CHRONULAC) 20 g, Oral, Every morning   lithium  carbonate 150-300 mg, Oral, See admin instructions, 2 entries on MAR:<BR>150 mg every morning, 300 mg at bedtime   metoprolol succinate (TOPROL-XL) 25 mg, Oral, Every morning   mirabegron ER (MYRBETRIQ) 50 mg, Oral, Every morning   pravastatin (PRAVACHOL) 20 mg, Oral, Daily at bedtime   risperiDONE (RISPERDAL) 3 mg, Oral, Daily at bedtime   Salicylic Acid (SELSUN BLUE [employer]UR[employer] DRY SCALP) 3 % SHAM 1 application , Topical, See admin instructions, Apply to scalp on shower days (Monday, Wednesday)   senna (SENOKOT) 17.2 mg, Oral, Daily at bedtime   sertraline (ZOLOFT) 100 mg, Oral, Every morning   traZODone (DESYREL) 50 mg, Oral, Daily at bedtime    Diet Orders (From admission, onward)     Start     Ordered   07/09/22 0845  Diet heart healthy/carb modified Room service appropriate? Yes with Assist; Fluid consistency: Thin  Diet effective now       Comments: Please send house tray  Question Answer Comment  Diet-HS Snack? Nothing   Room service appropriate? Yes with Assist   Fluid consistency: Thin      07/09/22 0845            DVT prophylaxis: enoxaparin (LOVENOX) injection 40 mg Start: 07/09/22 1800 SCDs Start: 07/09/22 0211 SCD's Start: 07/08/22 0816   Lab Results  Component Value Date   PLT 204 07/11/2022      Code Status: Full Code  Family Communication: no family at bedside   Status is: Inpatient  Remains inpatient appropriate because: severity of illness  Level of care: Med-Surg  Consultants:  Neurology Neurosurgery   Objective: Vitals:   07/11/22 2013 07/11/22 2345 07/12/22 0400 07/12/22 0740  BP: 121/64 (!) 140/91 (!) 130/93 (!) 141/85  Pulse: 98 90 86 65  Resp: '16 15 16 19  '$ Temp: 99.6 F (37.6 C) 97.9 F (36.6 C) 98.3 F (36.8 C) 97.7 F (36.5 C)  TempSrc: Oral  Oral Oral  SpO2: 100% 100% 100% 98%  Weight:      Height:        Intake/Output Summary (Last 24 hours) at 07/12/2022 0949 Last data filed at 07/11/2022 2200 Gross per 24 hour  Intake  240 ml  Output 1300 ml  Net -1060 ml    Wt Readings from Last 3 Encounters:  07/10/22 101.1 kg  06/13/21 100.2 kg  01/26/20 90.7 kg    Examination:  Constitutional: NAD Respiratory: CTA Cardiovascular: RRR  Data Reviewed: I have independently reviewed following labs and imaging studies   CBC Recent Labs  Lab 07/07/22 0736 07/09/22 0356 07/10/22 0431 07/11/22 0359  WBC 4.9 6.1 9.0 8.3  HGB 11.8* 11.3* 12.3 11.0*  HCT 37.1 35.3* 37.8 33.8*  PLT 187 192 200 204  MCV 86.7 87.4 86.7 85.4  MCH 27.6 28.0 28.2 27.8  MCHC 31.8 32.0 32.5 32.5  RDW 12.5 12.5 13.0 12.8  LYMPHSABS  --  0.5*  --   --   MONOABS  --  0.1  --   --   EOSABS  --  0.0  --   --   BASOSABS  --  0.0  --   --      Recent Labs  Lab 07/07/22 0736 07/08/22 0650 07/09/22 0356 07/10/22 0431 07/11/22 0359  NA 139  --  139 140 139  K 4.5  --  4.5 4.7 4.3  CL 110  --  113* 113* 110  CO2 20*  --  20* 21* 22  GLUCOSE 104*  --  157* 135* 138*  BUN 16  --  '12 10 11  '$ CREATININE 1.05* 1.02* 0.97 0.99 0.93  CALCIUM 8.9  --  8.0* 9.0 9.2  AST  --   --   --  13*  --   ALT  --   --   --  12  --   ALKPHOS  --   --   --  71  --   BILITOT  --   --   --  0.4  --   ALBUMIN  --   --   --  3.1*  --   MG  --   --   --  2.2  --      ------------------------------------------------------------------------------------------------------------------ No results for input(s): "CHOL", "HDL", "LDLCALC", "TRIG", "CHOLHDL", "LDLDIRECT" in the last 72 hours.  No results found for: "HGBA1C" ------------------------------------------------------------------------------------------------------------------ No results for input(s): "TSH", "T4TOTAL", "T3FREE", "THYROIDAB" in the last 72 hours.  Invalid input(s): "FREET3"  Cardiac Enzymes No results for input(s): "CKMB", "TROPONINI", "MYOGLOBIN" in the last 168 hours.  Invalid input(s):  "CK" ------------------------------------------------------------------------------------------------------------------ No results found for: "BNP"  CBG: Recent Labs  Lab 07/11/22 0612 07/11/22 1206 07/11/22 1646 07/11/22 2158 07/12/22 0634  GLUCAP 103* 106* 137* 149* 136*     Recent Results (from the past 240 hour(s))  Surgical PCR screen     Status: None   Collection Time: 07/08/22  3:40 AM   Specimen: Nasal Mucosa; Nasal Swab  Result Value Ref Range Status   MRSA, PCR NEGATIVE NEGATIVE Final   Staphylococcus aureus NEGATIVE NEGATIVE Final    Comment: (NOTE) The Xpert SA Assay (FDA approved for NASAL specimens in patients 56 years of age and older), is one component of a comprehensive surveillance program. It is not intended to diagnose infection nor to guide or monitor treatment. Performed at [facility], [place] [address] [place], [place] [address]      Radiology Studies: No results found.   Marzetta Board, MD, PhD [facility]  Between 7 am - 7 pm I am available, please contact me via Amion (for emergencies) or Securechat (non urgent messages)  Between 7 pm - 7 am I am not available, please contact night coverage MD/APP via Amion

## 2022-07-12 NOTE — Progress Notes (Signed)
Neurosurgery Service Progress Note  Subjective: No acute events overnight, no new complaints   Objective: Vitals:   07/11/22 2013 07/11/22 2345 07/12/22 0400 07/12/22 0740  BP: 121/64 (!) 140/91 (!) 130/93 (!) 141/85  Pulse: 98 90 86 65  Resp: '16 15 16 19  '$ Temp: 99.6 F (37.6 C) 97.9 F (36.6 C) 98.3 F (36.8 C) 97.7 F (36.5 C)  TempSrc: Oral  Oral Oral  SpO2: 100% 100% 100% 98%  Weight:      Height:        Physical Exam: Awake, Ox1, follows commands x4, some dysarthria, incision c/d/i  Assessment & Plan: 71 y.o. woman s/p meningioma resection, recovering well.  -no change in neurosurgical plan of care  Judith Part  07/12/22 10:49 AM

## 2022-07-13 DIAGNOSIS — G9340 Encephalopathy, unspecified: Secondary | ICD-10-CM | POA: Diagnosis not present

## 2022-07-13 LAB — GLUCOSE, CAPILLARY
Glucose-Capillary: 131 mg/dL — ABNORMAL HIGH (ref 70–99)
Glucose-Capillary: 145 mg/dL — ABNORMAL HIGH (ref 70–99)
Glucose-Capillary: 154 mg/dL — ABNORMAL HIGH (ref 70–99)
Glucose-Capillary: 157 mg/dL — ABNORMAL HIGH (ref 70–99)

## 2022-07-13 MED ORDER — LACTULOSE 10 GM/15ML PO SOLN
20.0000 g | Freq: Two times a day (BID) | ORAL | Status: DC
  Administered 2022-07-13 – 2022-07-14 (×3): 20 g via ORAL
  Filled 2022-07-13 (×3): qty 30

## 2022-07-13 NOTE — Progress Notes (Signed)
PROGRESS NOTE  Jenna Powell WVP:[id number] DOB: [birth year] DOA: 07/01/2022 PCP: Pcp, No   LOS: 11 days   Brief Narrative / Interim history: Jenna Powell is a 71 y.o. female with medical history significant of bipolar disorder, dementia, hypertension, diabetes who was found down at her facility.  Initial history obtained from facility and chart review, patient's mental status appears to be limited at baseline.  Patient episodes of vomiting with pinpoint pupils per facility found to be minimally responsive.  In the ED patient's mental status continued to wax and wane minimally improving, admitted for seizure evaluation by hospitalist team, neurology consulting.  Neurosurgery consulted due to mass as noted on CT.   Subjective / 24h Interval events: No complaints for me this morning  Assesement and Plan: Principal Problem:   Acute encephalopathy Active Problems:   Dementia without behavioral disturbance (HCC)   Bipolar disorder (HCC)   DM (diabetes mellitus), type 2 (HCC)   Essential hypertension   Meningioma (HCC)   Vasogenic edema (HCC)   Seizure ([place])   Principal problem Questionable acute onset seizure -Likely provoked seizure secondary to meningioma as below -continue Keppra per neurology.  Active problems Acute metabolic encephalopathy -better, overall much calmer  Meningioma -CT head remarkable for 3 x 3.5 cm mass concerning for meningioma with questionable vasogenic edema.  Neurosurgery consulted, she was taken to the OR on 12/5 and is status post tumor excision.  Continue Decadron taper.  SNF placement pending   Bipolar disorder, dementia -continue chronic medications due to agitation.  Hypertension - Continue metoprolol   Diabetes - SSI, CBGs acceptable, as below  CBG (last 3)  Recent Labs    07/12/22 1604 07/12/22 2103 07/13/22 0635  GLUCAP 130* 163* 131*    Contaminated blood culture- 1/2 positive for aerococcus - no signs/symptoms of infection,  follow  Scheduled Meds:  benztropine  1 mg Oral BID   clozapine  75 mg Oral BID   dexamethasone  2 mg Oral Q12H   Followed by   dexamethasone  1 mg Oral Q12H   Followed by   Derrill Memo ON 07/16/2022] dexamethasone  1 mg Oral Daily   Followed by   Derrill Memo ON 07/18/2022] dexamethasone  1 mg Oral Daily   donepezil  10 mg Oral QHS   enoxaparin (LOVENOX) injection  40 mg Subcutaneous Daily   insulin aspart  0-9 Units Subcutaneous TID WC   levETIRAcetam  500 mg Oral BID   lithium carbonate  150 mg Oral Q breakfast   lithium carbonate  300 mg Oral Q supper   mirabegron ER  50 mg Oral Daily   mouth rinse  15 mL Mouth Rinse 4 times per day   polyethylene glycol  17 g Oral Daily   pravastatin  20 mg Oral QHS   risperiDONE  3 mg Oral QHS   senna-docusate  1 tablet Oral QHS   sertraline  100 mg Oral q AM   sodium chloride flush  3 mL Intravenous Q12H   Continuous Infusions:   PRN Meds:.acetaminophen **OR** acetaminophen, enalaprilat, HYDROcodone-acetaminophen, labetalol, morphine injection, ondansetron **OR** ondansetron (ZOFRAN) IV, mouth rinse, mouth rinse, mouth rinse, promethazine  Current Outpatient Medications  Medication Instructions   aspirin 81 mg, Oral, Every morning   benztropine (COGENTIN) 1 mg, Oral, 2 times daily   Calcium Carb-Cholecalciferol (CALCIUM + VITAMIN D3) 600-10 MG-MCG TABS 1 tablet, Oral, Every 12 hours   Cholecalciferol 2,000 Units, Oral, Every morning   clozapine (CLOZARIL) 75 mg, Oral, 2 times daily  Dextran 70-Hypromellose, PF, (GENTEAL TEARS MODERATE PF) 0.1-0.3 % SOLN 1 drop, Both Eyes, 3 times daily   docusate sodium (COLACE) 100 mg, Oral, 3 times daily   donepezil (ARICEPT) 10 mg, Oral, Daily at bedtime   famotidine (PEPCID) 20 mg, Oral, Daily at bedtime   Fiber-Lax 625 mg, Oral, 2 times daily   lactulose (CHRONULAC) 20 g, Oral, Every morning   lithium carbonate 150-300 mg, Oral, See admin instructions, 2 entries on MAR:<BR>150 mg every morning, 300 mg  at bedtime   metoprolol succinate (TOPROL-XL) 25 mg, Oral, Every morning   mirabegron ER (MYRBETRIQ) 50 mg, Oral, Every morning   pravastatin (PRAVACHOL) 20 mg, Oral, Daily at bedtime   risperiDONE (RISPERDAL) 3 mg, Oral, Daily at bedtime   Salicylic Acid (SELSUN [employer] DRY SCALP) 3 % SHAM 1 application , Topical, See admin instructions, Apply to scalp on shower days (Monday, Wednesday)   senna (SENOKOT) 17.2 mg, Oral, Daily at bedtime   sertraline (ZOLOFT) 100 mg, Oral, Every morning   traZODone (DESYREL) 50 mg, Oral, Daily at bedtime    Diet Orders (From admission, onward)     Start     Ordered   07/09/22 0845  Diet heart healthy/carb modified Room service appropriate? Yes with Assist; Fluid consistency: Thin  Diet effective now       Comments: Please send house tray  Question Answer Comment  Diet-HS Snack? Nothing   Room service appropriate? Yes with Assist   Fluid consistency: Thin      07/09/22 0845            DVT prophylaxis: enoxaparin (LOVENOX) injection 40 mg Start: 07/09/22 1800 SCDs Start: 07/09/22 0211 SCD's Start: 07/08/22 0816   Lab Results  Component Value Date   PLT 204 07/11/2022      Code Status: Full Code  Family Communication: no family at bedside   Status is: Inpatient  Remains inpatient appropriate because: severity of illness  Level of care: Med-Surg  Consultants:  Neurology Neurosurgery   Objective: Vitals:   07/12/22 1927 07/12/22 2303 07/13/22 0314 07/13/22 0730  BP: 131/66 118/81 111/60 134/69  Pulse: 98 83 65 64  Resp: '18 18 16 18  '$ Temp: 98.1 F (36.7 C) 98.3 F (36.8 C) 98.3 F (36.8 C) 98.2 F (36.8 C)  TempSrc: Oral Oral Axillary   SpO2: 99% 95% 98% 97%  Weight:      Height:        Intake/Output Summary (Last 24 hours) at 07/13/2022 1020 Last data filed at 07/13/2022 0300 Gross per 24 hour  Intake 750 ml  Output 1700 ml  Net -950 ml    Wt Readings from Last 3 Encounters:  07/10/22 101.1 kg  06/13/21  100.2 kg  01/26/20 90.7 kg    Examination:  Constitutional: NAD Respiratory: CTA Cardiovascular: RRR  Data Reviewed: I have independently reviewed following labs and imaging studies   CBC Recent Labs  Lab 07/07/22 0736 07/09/22 0356 07/10/22 0431 07/11/22 0359  WBC 4.9 6.1 9.0 8.3  HGB 11.8* 11.3* 12.3 11.0*  HCT 37.1 35.3* 37.8 33.8*  PLT 187 192 200 204  MCV 86.7 87.4 86.7 85.4  MCH 27.6 28.0 28.2 27.8  MCHC 31.8 32.0 32.5 32.5  RDW 12.5 12.5 13.0 12.8  LYMPHSABS  --  0.5*  --   --   MONOABS  --  0.1  --   --   EOSABS  --  0.0  --   --   BASOSABS  --  0.0  --   --      Recent Labs  Lab 07/07/22 0736 07/08/22 0650 07/09/22 0356 07/10/22 0431 07/11/22 0359  NA 139  --  139 140 139  K 4.5  --  4.5 4.7 4.3  CL 110  --  113* 113* 110  CO2 20*  --  20* 21* 22  GLUCOSE 104*  --  157* 135* 138*  BUN 16  --  '12 10 11  '$ CREATININE 1.05* 1.02* 0.97 0.99 0.93  CALCIUM 8.9  --  8.0* 9.0 9.2  AST  --   --   --  13*  --   ALT  --   --   --  12  --   ALKPHOS  --   --   --  71  --   BILITOT  --   --   --  0.4  --   ALBUMIN  --   --   --  3.1*  --   MG  --   --   --  2.2  --      ------------------------------------------------------------------------------------------------------------------ No results for input(s): "CHOL", "HDL", "LDLCALC", "TRIG", "CHOLHDL", "LDLDIRECT" in the last 72 hours.  No results found for: "HGBA1C" ------------------------------------------------------------------------------------------------------------------ No results for input(s): "TSH", "T4TOTAL", "T3FREE", "THYROIDAB" in the last 72 hours.  Invalid input(s): "FREET3"  Cardiac Enzymes No results for input(s): "CKMB", "TROPONINI", "MYOGLOBIN" in the last 168 hours.  Invalid input(s): "CK" ------------------------------------------------------------------------------------------------------------------ No results found for: "BNP"  CBG: Recent Labs  Lab 07/12/22 0634  07/12/22 1157 07/12/22 1604 07/12/22 2103 07/13/22 0635  GLUCAP 136* 102* 130* 163* 131*     Recent Results (from the past 240 hour(s))  Surgical PCR screen     Status: None   Collection Time: 07/08/22  3:40 AM   Specimen: Nasal Mucosa; Nasal Swab  Result Value Ref Range Status   MRSA, PCR NEGATIVE NEGATIVE Final   Staphylococcus aureus NEGATIVE NEGATIVE Final    Comment: (NOTE) The Xpert SA Assay (FDA approved for NASAL specimens in patients 12 years of age and older), is one component of a comprehensive surveillance program. It is not intended to diagnose infection nor to guide or monitor treatment. Performed at [facility], [place] [address], [place], [place] [address]      Radiology Studies: No results found.   Marzetta Board, MD, PhD [facility]  Between 7 am - 7 pm I am available, please contact me via Amion (for emergencies) or Securechat (non urgent messages)  Between 7 pm - 7 am I am not available, please contact night coverage MD/APP via Amion

## 2022-07-13 NOTE — Progress Notes (Signed)
Previous SW notes indicate family requesting [facility]. No response from [facility] at this time. SNF search expanded, will f/u with offers as available. Anticipate dc Monday pending medical clearance. MD updated.   Wandra Feinstein, MSW, LCSW [phone] (coverage)

## 2022-07-13 NOTE — Progress Notes (Signed)
More alert and ready to eat breakfast; assisted to feed; took medications without difficulty; following commands.

## 2022-07-13 NOTE — Progress Notes (Signed)
Very sleepy but arousable; not ready to eat breakfast.

## 2022-07-14 DIAGNOSIS — G9340 Encephalopathy, unspecified: Secondary | ICD-10-CM | POA: Diagnosis not present

## 2022-07-14 LAB — GLUCOSE, CAPILLARY
Glucose-Capillary: 123 mg/dL — ABNORMAL HIGH (ref 70–99)
Glucose-Capillary: 104 mg/dL — ABNORMAL HIGH (ref 70–99)
Glucose-Capillary: 121 mg/dL — ABNORMAL HIGH (ref 70–99)

## 2022-07-14 LAB — SURGICAL PATHOLOGY

## 2022-07-14 MED ORDER — DEXAMETHASONE 1 MG PO TABS: ORAL_TABLET | ORAL | 0 refills | Status: AC

## 2022-07-14 MED ORDER — BISACODYL 10 MG RE SUPP
10.0000 mg | Freq: Once | RECTAL | Status: AC
  Administered 2022-07-14: 10 mg via RECTAL
  Filled 2022-07-14: qty 1

## 2022-07-14 MED ORDER — LEVETIRACETAM 500 MG PO TABS: 500.0000 mg | ORAL_TABLET | Freq: Two times a day (BID) | ORAL | Status: AC

## 2022-07-14 MED ORDER — HYDROCODONE-ACETAMINOPHEN 5-325 MG PO TABS: 1.0000 | ORAL_TABLET | ORAL | 0 refills | Status: AC | PRN

## 2022-07-14 NOTE — Discharge Summary (Signed)
Physician Discharge Summary  Jenna Powell ERD:[id number] DOB: 10-07-1950 DOA: 07/01/2022  PCP: Merryl Hacker, No  Admit date: 07/01/2022 Discharge date: 07/14/2022  Admitted From: SNF Disposition:  SNF  Recommendations for Outpatient Follow-up:  Follow up with PCP in 1-2 weeks Follow-up with neurosurgery as an outpatient  Home Health: none Equipment/Devices: none  Discharge Condition: stable CODE STATUS: Full code  HPI: Per admitting MD, Jenna Powell is a 71 y.o. female with medical history significant of bipolar disorder, dementia, hypertension, diabetes who was found down at her facility. History obtained with assistance of chart review.  Patient was found unresponsive at her facility with last known normal around 10:20 AM.  EMS was called and patient vomited 3 times throughout and had  pinpoint pupils.  Patient had improved in her alertness in the ED. History was obtained with assistance of chart review and family who reported the patient is typically alert and oriented x 2 and interactive and talkative.  History of 2 prior admissions for somnolence that were thought to be due to polypharmacy.  Hospital Course / Discharge diagnoses: Principal Problem:   Acute encephalopathy Active Problems:   Dementia without behavioral disturbance (HCC)   Bipolar disorder (HCC)   DM (diabetes mellitus), type 2 ([place])   Essential hypertension   Meningioma (HCC)   Vasogenic edema (HCC)   Seizure ([place])   Principal problem Questionable acute onset seizure -Likely provoked seizure secondary to meningioma as below -continue Keppra per neurology.   Active problems Acute metabolic encephalopathy -appears back to baseline  Meningioma -CT head remarkable for 3 x 3.5 cm mass concerning for meningioma with questionable vasogenic edema.  Neurosurgery consulted, she was taken to the OR on 12/5 and is status post tumor excision.  Continue Decadron taper.   Bipolar disorder, dementia -continue chronic  medications  Hypertension - Continue metoprolol Diabetes -carb modified diet Contaminated blood culture- 1/2 positive for aerococcus - no signs/symptoms of infection  Sepsis ruled out   Discharge Instructions   Allergies as of 07/14/2022   No Known Allergies      Medication List     STOP taking these medications    aspirin 81 MG chewable tablet       TAKE these medications    benztropine 1 MG tablet Commonly known as: COGENTIN Take 1 tablet (1 mg total) by mouth 2 (two) times daily.   Calcium + Vitamin D3 600-10 MG-MCG Tabs Generic drug: Calcium Carb-Cholecalciferol Take 1 tablet by mouth every 12 (twelve) hours.   Cholecalciferol 50 MCG (2000 UT) Caps Take 2,000 Units by mouth in the morning.   clozapine 50 MG tablet Commonly known as: CLOZARIL Take 75 mg by mouth 2 (two) times daily.   dexamethasone 1 MG tablet Commonly known as: DECADRON Take 1 tablet (1 mg total) by mouth 2 (two) times daily with a meal for 2 days, THEN 1 tablet (1 mg total) daily with breakfast for 4 days. Start taking on: July 14, 2022   docusate sodium 100 MG capsule Commonly known as: COLACE Take 100 mg by mouth 3 (three) times daily.   donepezil 10 MG tablet Commonly known as: ARICEPT Take 10 mg by mouth at bedtime.   famotidine 20 MG tablet Commonly known as: PEPCID Take 20 mg by mouth at bedtime.   Fiber-Lax 625 MG tablet Generic drug: polycarbophil Take 625 mg by mouth 2 (two) times daily.   GenTeal Tears Moderate PF 0.1-0.3 % Soln Generic drug: Dextran 70-Hypromellose (PF) Place 1 drop into  both eyes 3 (three) times daily.   HYDROcodone-acetaminophen 5-325 MG tablet Commonly known as: NORCO/VICODIN Take 1 tablet by mouth every 4 (four) hours as needed for moderate pain.   lactulose 10 GM/15ML solution Commonly known as: CHRONULAC Take 20 g by mouth in the morning.   levETIRAcetam 500 MG tablet Commonly known as: KEPPRA Take 1 tablet (500 mg total) by mouth  2 (two) times daily.   lithium carbonate 150 MG capsule Take 150-300 mg by mouth See admin instructions. 2 entries on MAR: 150 mg every morning, 300 mg at bedtime   metoprolol succinate 25 MG 24 hr tablet Commonly known as: TOPROL-XL Take 25 mg by mouth in the morning.   mirabegron ER 50 MG Tb24 tablet Commonly known as: MYRBETRIQ Take 50 mg by mouth in the morning.   pravastatin 20 MG tablet Commonly known as: PRAVACHOL Take 20 mg by mouth at bedtime.   risperiDONE 3 MG tablet Commonly known as: RISPERDAL Take 3 mg by mouth at bedtime.   Selsun Blue Naturals Dry Scalp 3 % Sham Generic drug: Salicylic Acid Apply 1 application  topically See admin instructions. Apply to scalp on shower days (Monday, Wednesday)   senna 8.6 MG Tabs tablet Commonly known as: SENOKOT Take 17.2 mg by mouth at bedtime.   sertraline 100 MG tablet Commonly known as: ZOLOFT Take 100 mg by mouth in the morning.   traZODone 50 MG tablet Commonly known as: DESYREL Take 50 mg by mouth at bedtime.       Consultations: Neurology neurosurgery  Procedures/Studies:  MR BRAIN WO CONTRAST  Result Date: 07/09/2022 CLINICAL DATA:  Brain CNS neoplasm.  Assess treatment response. EXAM: MRI HEAD WITHOUT CONTRAST TECHNIQUE: Multiplanar, multiecho pulse sequences of the brain and surrounding structures were obtained without intravenous contrast. COMPARISON:  MRI Brain 07/03/22, CT DBS 07/08/22 FINDINGS: Limitations: Markedly limited exam due to the degree of severe motion artifact, even on the diffusion-weighted and propeller sequences. Brain: There are postsurgical changes from an interval left parietal craniotomy and tumor resection. There is T2/FLAIR hyperintense signal abnormality along the anterior margin of the presumed resection cavity, unchanged from prior exam. There is a small amount of blood products within the resection cavity. On the sagittal T1 weighted imaging a small amount of the left parietal  cortex appears extend into the craniotomy site (series 5, image 16). Vascular: Incompletely assessed due to the degree of motion artifact. Skull and upper cervical spine: Normal marrow signal. Sinuses/Orbits: Negative. Other: There is soft tissue swelling along the parietal and occipital scalp (series 5, image 14). IMPRESSION: 1. Markedly limited exam due to the degree of severe motion artifact, even on the diffusion-weighted and propeller sequences. 2. Postsurgical changes from an interval left parietal craniotomy and tumor resection. On the sagittal T1 weighted imaging a small amount of the left parietal cortex appears to extend into the craniotomy defect. This may be artifactual due to the degree of motion artifact, but further evaluation with a CT of the brain could be considered for more definitive characterization. Electronically Signed   By: Marin Roberts M.D.   On: 07/09/2022 16:55   CT DBS HEAD W/O CONTRAST (1MM)  Result Date: 07/08/2022 CLINICAL DATA:  Brain mass.  CT for surgical planning. EXAM: CT HEAD WITHOUT CONTRAST TECHNIQUE: Contiguous axial images were obtained from the base of the skull through the vertex without intravenous contrast. RADIATION DOSE REDUCTION: This exam was performed according to the departmental dose-optimization program which includes automated exposure control, adjustment of  the mA and/or kV according to patient size and/or use of iterative reconstruction technique. COMPARISON:  Head MRI 07/03/2022 and head CT 07/01/2022 FINDINGS: Brain: An extra-axial mass along the posterior aspect of the falx in the left parietal region is unchanged from the recent prior examinations, and associated mild vasogenic edema in the left parietal white matter is also unchanged. There is no evidence of an acute infarct, intracranial hemorrhage, midline shift, or extra-axial fluid collection. The ventricles are normal in size. There is a partially empty sella. Vascular: No hyperdense vessel.  Skull: No fracture or suspicious osseous lesion. Sinuses/Orbits: Minimal mucosal thickening in the paranasal sinuses. No significant mastoid fluid. Unremarkable orbits. Other: None. IMPRESSION: CT for surgical planning. 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 Electronically Signed   By: Logan Bores M.D.   On: 07/08/2022 09:40   MR Brain W and Wo Contrast  Result Date: 07/03/2022 CLINICAL DATA:  Meningioma include Stealth or Brainlab protocol; Headache, no red flags. EXAM: MRI HEAD WITHOUT AND WITH CONTRAST MR VENOGRAM HEAD WITHOUT AND WITH CONTRAST TECHNIQUE: Multiplanar, multi-echo pulse sequences of the brain and surrounding structures were acquired without and with intravenous contrast. Angiographic images of the intracranial venous structures were acquired using MRV technique without and with intravenous contrast. CONTRAST:  6m GADAVIST GADOBUTROL 1 MMOL/ML IV SOLN COMPARISON:  Head CT 07/01/2022 FINDINGS: MRI HEAD WITHOUT AND WITH CONTRAST The study is mildly motion degraded. Brain: A stealth protocol was performed for surgical planning. This does not include all routine brain MRI sequences, and the lack of diffusion weighted imaging specifically limits assessment for acute infarct. An avidly, homogeneously enhancing extra-axial mass along the left aspect of the posterior falx in the parietal region measures 2.3 x 3.1 x 4.2 cm with an associated dural tail. There is local mass effect on the medial left parietal lobe with associated mild vasogenic edema in the left parietal white matter. The superior sagittal sinus is compressed but not frankly invaded by the mass. No other enhancing intracranial lesion is present. There is no midline shift or extra-axial fluid collection. Minimal scattered T2 hyperintensities in the cerebral white matter bilaterally are within normal limits for age. The ventricles are normal in size. There is a partially empty sella. Vascular: Compressed but  grossly preserved superior sagittal sinus flow void on the FLAIR sequence. Skull and upper cervical spine: Unremarkable bone marrow signal. Sinuses/Orbits: No acute finding. Other: None. MR VENOGRAM HEAD WITHOUT AND WITH CONTRAST There is no evidence of dural venous sinus or deep cerebral vein thrombosis. The posterior aspect of the superior sagittal sinus is compressed by the mass but remains patent. 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 Compression of the adjacent superior sagittal sinus which remains patent without gross invasion. Electronically Signed   By: ALogan BoresM.D.   On: 07/03/2022 15:26   MR MRV HEAD W WO CONTRAST  Result Date: 07/03/2022 CLINICAL DATA:  Meningioma include Stealth or Brainlab protocol; Headache, no red flags. EXAM: MRI HEAD WITHOUT AND WITH CONTRAST MR VENOGRAM HEAD WITHOUT AND WITH CONTRAST TECHNIQUE: Multiplanar, multi-echo pulse sequences of the brain and surrounding structures were acquired without and with intravenous contrast. Angiographic images of the intracranial venous structures were acquired using MRV technique without and with intravenous contrast. CONTRAST:  137mGADAVIST GADOBUTROL 1 MMOL/ML IV SOLN COMPARISON:  Head CT 07/01/2022 FINDINGS: MRI HEAD WITHOUT AND WITH CONTRAST The study is mildly motion degraded. Brain: A stealth protocol was  performed for surgical planning. This does not include all routine brain MRI sequences, and the lack of diffusion weighted imaging specifically limits assessment for acute infarct. An avidly, homogeneously enhancing extra-axial mass along the left aspect of the posterior falx in the parietal region measures 2.3 x 3.1 x 4.2 cm with an associated dural tail. There is local mass effect on the medial left parietal lobe with associated mild vasogenic edema in the left parietal white matter. The superior sagittal sinus is compressed but not frankly invaded by the  mass. No other enhancing intracranial lesion is present. There is no midline shift or extra-axial fluid collection. Minimal scattered T2 hyperintensities in the cerebral white matter bilaterally are within normal limits for age. The ventricles are normal in size. There is a partially empty sella. Vascular: Compressed but grossly preserved superior sagittal sinus flow void on the FLAIR sequence. Skull and upper cervical spine: Unremarkable bone marrow signal. Sinuses/Orbits: No acute finding. Other: None. MR VENOGRAM HEAD WITHOUT AND WITH CONTRAST There is no evidence of dural venous sinus or deep cerebral vein thrombosis. The posterior aspect of the superior sagittal sinus is compressed by the mass but remains patent. 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 Compression of the adjacent superior sagittal sinus which remains patent without gross invasion. Electronically Signed   By: Logan Bores M.D.   On: 07/03/2022 15:26   EEG adult  Result Date: 07/01/2022 Lora Havens, MD     07/01/2022  8:58 PM Patient Name: Jenna Powell MRN: [id number] Epilepsy Attending: Lora Havens Referring Physician/Provider: Regan Lemming, MD Date: 07/01/2022 Duration: 24.16 mins Patient history: 71 y.o. female with DM, bipolar disorder, schizophrenia presented to the hospital after an episode of syncope. EEG to evaluate for seizure. Level of alertness: Awake, asleep AEDs during EEG study: LEV, Ativan Technical aspects: This EEG study was done with scalp electrodes positioned according to the 10-20 International system of electrode placement. Electrical activity was reviewed with band pass filter of 1-'70Hz'$ , sensitivity of 7 uV/mm, display speed of 66m/sec with a '60Hz'$  notched filter applied as appropriate. EEG data were recorded continuously and digitally stored.  Video monitoring was available and reviewed as appropriate. Description: No clear posterior  dominant rhythm was seen. Sleep was characterized by sleep spindles (12 to 14 Hz), maximal frontocentral region.  EEG showed continuous generalized 3 to 7 Hz theta-delta slowing. Hyperventilation and photic stimulation were not performed.   ABNORMALITY - Continuous slow, generalized IMPRESSION: This study is suggestive of moderate diffuse encephalopathy, nonspecific etiology. No seizures or epileptiform discharges were seen throughout the recording. Priyanka OBarbra Sarks  CT HEAD WO CONTRAST  Result Date: 07/01/2022 CLINICAL DATA:  Mental status change of unknown cause. EXAM: CT HEAD WITHOUT CONTRAST TECHNIQUE: Contiguous axial images were obtained from the base of the skull through the vertex without intravenous contrast. RADIATION DOSE REDUCTION: This exam was performed according to the departmental dose-optimization program which includes automated exposure control, adjustment of the mA and/or kV according to patient size and/or use of iterative reconstruction technique. COMPARISON:  01/26/2020 FINDINGS: Brain: No abnormality affects the brainstem or cerebellum. The right cerebral hemisphere is normal for age. On the left, there is vasogenic edema in the parietal vertex region. There is a slightly hyperdense extra-axial mass lesion measuring 3 x 3.5 cm in diameter consistent with meningioma. This can be seen as a very subtle finding in 2021, not prospectively visible. This has enlarged. There is  a broad surface along the falx. I cannot make any statement regarding superior sagittal sinus invasion but that is possible. MRI with contrast would be recommended for further evaluation. No hydrocephalus. No hemorrhage. No extra-axial fluid collection. Vascular: No abnormal vascular finding. Skull: Negative Sinuses/Orbits: Clear/normal Other: None IMPRESSION: 3 x 3.5 cm extra-axial mass lesion in the left parietal vertex region with vasogenic edema within the left parietal lobe. This can be seen as a very subtle finding  in 2021, smaller and not prospectively visible. This is consistent with a meningioma. I cannot make any statement regarding superior sagittal sinus invasion but that is possible. MRI with contrast would be recommended for further evaluation. Electronically Signed   By: Nelson Chimes M.D.   On: 07/01/2022 13:53   DG Chest Port 1 View  Result Date: 07/01/2022 CLINICAL DATA:  Altered mental status. EXAM: PORTABLE CHEST 1 VIEW COMPARISON:  Chest x-ray dated January 26, 2020. FINDINGS: Chronic cardiomegaly. Normal pulmonary vascularity. Chronic low lung volumes with bibasilar atelectasis. Chronic elevation of the left hemidiaphragm. No pneumothorax or large pleural effusion. No acute osseous abnormality. IMPRESSION: 1. Chronic low lung volumes with bibasilar atelectasis. Electronically Signed   By: Titus Dubin M.D.   On: 07/01/2022 12:40     Subjective: - no chest pain, shortness of breath, no abdominal pain, nausea or vomiting.   Discharge Exam: BP 122/72 (BP Location: Left Arm)   Pulse 70   Temp 97.8 F (36.6 C) (Axillary)   Resp 19   Ht '5\' 1"'$  (1.549 m)   Wt 101.1 kg   SpO2 100%   BMI 42.11 kg/m   General: Pt is alert, awake, not in acute distress Cardiovascular: RRR, S1/S2 +, no rubs, no gallops Respiratory: CTA bilaterally, no wheezing, no rhonchi Abdominal: Soft, NT, ND, bowel sounds + Extremities: no edema, no cyanosis  The results of significant diagnostics from this hospitalization (including imaging, microbiology, ancillary and laboratory) are listed below for reference.     Microbiology: Recent Results (from the past 240 hour(s))  Surgical PCR screen     Status: None   Collection Time: 07/08/22  3:40 AM   Specimen: Nasal Mucosa; Nasal Swab  Result Value Ref Range Status   MRSA, PCR NEGATIVE NEGATIVE Final   Staphylococcus aureus NEGATIVE NEGATIVE Final    Comment: (NOTE) The Xpert SA Assay (FDA approved for NASAL specimens in patients 4 years of age and older), is one  component of a comprehensive surveillance program. It is not intended to diagnose infection nor to guide or monitor treatment. Performed at [facility], [place] [address], [place], [place] [address]      Labs: Basic Metabolic Panel: Recent Labs  Lab 07/08/22 0650 07/09/22 0356 07/10/22 0431 07/11/22 0359  NA  --  139 140 139  K  --  4.5 4.7 4.3  CL  --  113* 113* 110  CO2  --  20* 21* 22  GLUCOSE  --  157* 135* 138*  BUN  --  '12 10 11  '$ CREATININE 1.02* 0.97 0.99 0.93  CALCIUM  --  8.0* 9.0 9.2  MG  --   --  2.2  --    Liver Function Tests: Recent Labs  Lab 07/10/22 0431  AST 13*  ALT 12  ALKPHOS 71  BILITOT 0.4  PROT 6.0*  ALBUMIN 3.1*   CBC: Recent Labs  Lab 07/09/22 0356 07/10/22 0431 07/11/22 0359  WBC 6.1 9.0 8.3  NEUTROABS 5.4  --   --  HGB 11.3* 12.3 11.0*  HCT 35.3* 37.8 33.8*  MCV 87.4 86.7 85.4  PLT 192 200 204   CBG: Recent Labs  Lab 07/13/22 0635 07/13/22 1135 07/13/22 1701 07/13/22 2053 07/14/22 0606  GLUCAP 131* 145* 154* 157* 123*   Hgb A1c No results for input(s): "HGBA1C" in the last 72 hours. Lipid Profile No results for input(s): "CHOL", "HDL", "LDLCALC", "TRIG", "CHOLHDL", "LDLDIRECT" in the last 72 hours. Thyroid function studies No results for input(s): "TSH", "T4TOTAL", "T3FREE", "THYROIDAB" in the last 72 hours.  Invalid input(s): "FREET3" Urinalysis    Component Value Date/Time   COLORURINE YELLOW 01/26/2020 [place] 01/26/2020 1602   LABSPEC 1.006 01/26/2020 1602   PHURINE 6.0 01/26/2020 1602   GLUCOSEU NEGATIVE 01/26/2020 1602   HGBUR NEGATIVE 01/26/2020 1602   BILIRUBINUR NEGATIVE 01/26/2020 1602   KETONESUR NEGATIVE 01/26/2020 1602   PROTEINUR NEGATIVE 01/26/2020 1602   NITRITE NEGATIVE 01/26/2020 1602   LEUKOCYTESUR NEGATIVE 01/26/2020 1602    FURTHER DISCHARGE INSTRUCTIONS:   Get Medicines reviewed and adjusted: Please take all your medications with you for your next visit with  your Primary MD   Laboratory/radiological data: Please request your Primary MD to go over all hospital tests and procedure/radiological results at the follow up, please ask your Primary MD to get all Hospital records sent to his/her office.   In some cases, they will be blood work, cultures and biopsy results pending at the time of your discharge. Please request that your primary care M.D. goes through all the records of your hospital data and follows up on these results.   Also Note the following: If you experience worsening of your admission symptoms, develop shortness of breath, life threatening emergency, suicidal or homicidal thoughts you must seek medical attention immediately by calling 911 or calling your MD immediately  if symptoms less severe.   You must read complete instructions/literature along with all the possible adverse reactions/side effects for all the Medicines you take and that have been prescribed to you. Take any new Medicines after you have completely understood and accpet all the possible adverse reactions/side effects.    Do not drive when taking Pain medications or sleeping medications (Benzodaizepines)   Do not take more than prescribed Pain, Sleep and Anxiety Medications. It is not advisable to combine anxiety,sleep and pain medications without talking with your primary care practitioner   Special Instructions: If you have smoked or chewed Tobacco  in the last 2 yrs please stop smoking, stop any regular Alcohol  and or any Recreational drug use.   Wear Seat belts while driving.   Please note: You were cared for by a hospitalist during your hospital stay. Once you are discharged, your primary care physician will handle any further medical issues. Please note that NO REFILLS for any discharge medications will be authorized once you are discharged, as it is imperative that you return to your primary care physician (or establish a relationship with a primary care  physician if you do not have one) for your post hospital discharge needs so that they can reassess your need for medications and monitor your lab values.  Time coordinating discharge: 35 minutes  SIGNED:  Marzetta Board, MD, PhD 07/14/2022, 11:17 AM

## 2022-07-14 NOTE — Progress Notes (Signed)
Report called to [facility] RN; awaitng transport.

## 2022-07-14 NOTE — TOC Transition Note (Signed)
Transition of Care  [facility]) - CM/SW Discharge Note   Patient Details  Name: Jenna Powell MRN: [id number] Date of Birth: [birth year]  [facility] CM/SW Contact:  [name], LCSW Phone Number: 07/14/2022, 1:23 PM   Clinical Narrative:     PT to go to [facility] via [place] Call to report [phone] Room 125B  Final next level of care: [place] Barriers to Discharge: No Barriers Identified   Patient Goals and CMS Choice Patient states their goals for this hospitalization and ongoing recovery are:: to get stronger with rehab and not to go back to [facility].gov Compare Post Acute Care list provided to:: Patient Represenative (must comment) (Daughter, Lelan Pons) Choice offered to / list presented to : Adult Children  Discharge Placement              Patient chooses bed at: [facility] Patient to be transferred to facility by: [place] Name of family member notified: Lelan Pons Patient and family notified of of transfer: 07/14/22  Discharge Plan and Services                                     Social Determinants of Health (SDOH) Interventions     Readmission Risk Interventions     No data to display

## 2022-07-14 NOTE — Progress Notes (Signed)
Mobility Specialist: Progress Note   07/14/22 1640  Mobility  Activity Transferred from bed to chair  Level of Assistance Minimal assist, patient does 75% or more  Assistive Device Front wheel walker  Distance Ambulated (ft) 2 ft  Activity Response Tolerated well  Mobility Referral Yes  $Mobility charge 1 Mobility   Pt received in the bed and agreeable to mobility. MinA with bed mobility as well as to stand. Had pt transfer to the chair to change bedding. MinA physical assist during transfer d/t bilateral knee buckling. Pt has call bell in her lap. Chair alarm is on.   Jenna Powell Mobility Specialist Please contact via SecureChat or Rehab office at [phone]

## 2022-07-14 NOTE — Progress Notes (Signed)
Patient discharged out via stretcher via [facility] transport; belongings bag sent with patient; family called to report patient discharge from hospital. AVS reviewed with patient and sent with transport.

## 2022-07-14 NOTE — TOC Progression Note (Signed)
Transition of Care [facility]) - Progression Note    Patient Details  Name: Jenna Powell MRN: [id number] Date of Birth: 06-05-1951  Transition of Care [facility]) CM/SW Contact  [name], [place] Phone Number: 07/14/2022, 10:52 AM  Clinical Narrative:     CSW conversed with Lexine Baton at [facility] who reports they can not take pt due to [facility] hx and not meeting medical necessity for long term care; AF denied. CSW followed up with pt's daughter in law who decided to go with [facility]. Spoke with Juliann Pulse at [facility]   Expected Discharge Plan: [place] Barriers to Discharge: SNF Pending bed offer, SNF Pending transportation  Expected Discharge Plan and Services Expected Discharge Plan: [place] arrangements for the past 2 months: Assisted Living Facility                                       Social Determinants of Health (SDOH) Interventions    Readmission Risk Interventions     No data to display

## 2022-07-21 ENCOUNTER — Inpatient Hospital Stay: Payer: Medicare Other | Attending: Internal Medicine

## 2022-11-05 ENCOUNTER — Other Ambulatory Visit (HOSPITAL_COMMUNITY): Payer: Self-pay | Admitting: Neurosurgery

## 2022-11-05 DIAGNOSIS — D329 Benign neoplasm of meninges, unspecified: Secondary | ICD-10-CM

## 2022-11-20 ENCOUNTER — Ambulatory Visit (HOSPITAL_COMMUNITY)
Admission: RE | Admit: 2022-11-20 | Discharge: 2022-11-20 | Disposition: A | Discharge status: Home / Self Care | Payer: Medicare Other | Source: Ambulatory Visit | Attending: Neurosurgery | Admitting: Neurosurgery

## 2022-11-20 DIAGNOSIS — D329 Benign neoplasm of meninges, unspecified: Secondary | ICD-10-CM | POA: Diagnosis not present

## 2022-11-20 MED ORDER — GADOBUTROL 1 MMOL/ML IV SOLN
8.0000 mL | Freq: Once | INTRAVENOUS | Status: AC | PRN
  Administered 2022-11-20: 8 mL via INTRAVENOUS

## 2023-05-20 ENCOUNTER — Emergency Department (HOSPITAL_COMMUNITY)
Admission: EM | Admit: 2023-05-20 | Discharge: 2023-05-20 | Disposition: A | Discharge status: Home / Self Care | Payer: Medicare Other | Attending: Emergency Medicine | Admitting: Emergency Medicine

## 2023-05-20 ENCOUNTER — Other Ambulatory Visit: Payer: Self-pay

## 2023-05-20 ENCOUNTER — Emergency Department (HOSPITAL_COMMUNITY): Payer: Medicare Other

## 2023-05-20 ENCOUNTER — Encounter (HOSPITAL_COMMUNITY): Payer: Self-pay | Admitting: Emergency Medicine

## 2023-05-20 DIAGNOSIS — Z79899 Other long term (current) drug therapy: Secondary | ICD-10-CM | POA: Diagnosis not present

## 2023-05-20 DIAGNOSIS — E119 Type 2 diabetes mellitus without complications: Secondary | ICD-10-CM | POA: Insufficient documentation

## 2023-05-20 DIAGNOSIS — Z7984 Long term (current) use of oral hypoglycemic drugs: Secondary | ICD-10-CM | POA: Insufficient documentation

## 2023-05-20 DIAGNOSIS — I10 Essential (primary) hypertension: Secondary | ICD-10-CM | POA: Insufficient documentation

## 2023-05-20 DIAGNOSIS — R2689 Other abnormalities of gait and mobility: Secondary | ICD-10-CM | POA: Diagnosis present

## 2023-05-20 DIAGNOSIS — F0390 Unspecified dementia without behavioral disturbance: Secondary | ICD-10-CM | POA: Insufficient documentation

## 2023-05-20 DIAGNOSIS — R269 Unspecified abnormalities of gait and mobility: Secondary | ICD-10-CM

## 2023-05-20 LAB — BASIC METABOLIC PANEL
Anion gap: 6 (ref 5–15)
BUN: 14 mg/dL (ref 8–23)
CO2: 28 mmol/L (ref 22–32)
Calcium: 9.3 mg/dL (ref 8.9–10.3)
Chloride: 106 mmol/L (ref 98–111)
Creatinine, Ser: 1.07 mg/dL — ABNORMAL HIGH (ref 0.44–1.00)
GFR, Estimated: 56 mL/min — ABNORMAL LOW (ref >60)
Glucose, Bld: 113 mg/dL — ABNORMAL HIGH (ref 70–99)
Potassium: 4.2 mmol/L (ref 3.5–5.1)
Sodium: 140 mmol/L (ref 135–145)

## 2023-05-20 LAB — CBC WITH DIFFERENTIAL/PLATELET
Abs Immature Granulocytes: 0.01 10*3/uL (ref 0.00–0.07)
Basophils Absolute: 0 10*3/uL (ref 0.0–0.1)
Basophils Relative: 0 %
Eosinophils Absolute: 0 10*3/uL (ref 0.0–0.5)
Eosinophils Relative: 0 %
HCT: 37.3 % (ref 36.0–46.0)
Hemoglobin: 11.4 g/dL — ABNORMAL LOW (ref 12.0–15.0)
Immature Granulocytes: 0 %
Lymphocytes Relative: 22 %
Lymphs Abs: 1 10*3/uL (ref 0.7–4.0)
MCH: 28.1 pg (ref 26.0–34.0)
MCHC: 30.6 g/dL (ref 30.0–36.0)
MCV: 92.1 fL (ref 80.0–100.0)
Monocytes Absolute: 0.3 10*3/uL (ref 0.1–1.0)
Monocytes Relative: 7 %
Neutro Abs: 3.3 10*3/uL (ref 1.7–7.7)
Neutrophils Relative %: 71 %
Platelets: 196 10*3/uL (ref 150–400)
RBC: 4.05 MIL/uL (ref 3.87–5.11)
RDW: 13.1 % (ref 11.5–15.5)
WBC: 4.7 10*3/uL (ref 4.0–10.5)
nRBC: 0 % (ref 0.0–0.2)

## 2023-05-20 LAB — LITHIUM LEVEL: Lithium Lvl: 0.45 mmol/L — ABNORMAL LOW (ref 0.60–1.20)

## 2023-05-20 NOTE — Discharge Instructions (Addendum)
If you develop new or worsening symptoms, please return to the ER or call 911.  Otherwise follow-up with your primary care physician.

## 2023-05-20 NOTE — ED Notes (Signed)
Contacted [facility] for transport

## 2023-05-20 NOTE — ED Notes (Signed)
Bed alarm placed.

## 2023-05-20 NOTE — ED Notes (Signed)
Called [facility] to give report on Pt
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
## 2023-05-20 NOTE — ED Triage Notes (Signed)
Patient presents from [facility]. The patient will sometimes walk bent over, but the facility noticed her doing this more frequently. They would like her evaluated. They are unsure of the language she speaks, but believe it is [place].   HX Dementia (memory care unit)  EMS vitals: 120/66 BP 74 HR 149 CBG 95% SPO2 on room air

## 2023-05-20 NOTE — ED Provider Notes (Signed)
[place] EMERGENCY DEPARTMENT AT [facility] Provider Note  CSN: [id number] Arrival date & time: 05/20/23 1409  Chief Complaint(s) No chief complaint on file.  HPI Jenna Powell is a 72 y.o. female here today for abnormal gait.   Past Medical History Past Medical History:  Diagnosis Date   Dementia (HCC)    Diabetes mellitus without complication (HCC)    Hyperlipidemia    Hypertension    Patient Active Problem List   Diagnosis Date Noted   Seizure (HCC) 07/02/2022   Acute encephalopathy 07/01/2022   Meningioma (HCC) 07/01/2022   Vasogenic edema (HCC) 07/01/2022   Somnolence 01/26/2020   Polypharmacy 11/23/2018   Dementia without behavioral disturbance (HCC) 11/22/2018   Bipolar disorder (HCC) 11/22/2018   DM (diabetes mellitus), type 2 (HCC) 11/22/2018   Essential hypertension 11/22/2018   Home Medication(s) Prior to Admission medications   Medication Sig Start Date End Date Taking? Authorizing Provider  benztropine (COGENTIN) 1 MG tablet Take 1 tablet (1 mg total) by mouth 2 (two) times daily. 11/23/18   [place], Alvira Philips, DO  Calcium Carb-Cholecalciferol (CALCIUM + VITAMIN D3) 600-10 MG-MCG TABS Take 1 tablet by mouth every 12 (twelve) hours.    [provider]  Cholecalciferol 50 MCG (2000 UT) CAPS Take 2,000 Units by mouth in the morning.    [provider]  clozapine (CLOZARIL) 50 MG tablet Take 75 mg by mouth 2 (two) times daily.    [provider]  Dextran 70-Hypromellose, PF, (GENTEAL TEARS MODERATE PF) 0.1-0.3 % SOLN Place 1 drop into both eyes 3 (three) times daily.    [provider]  docusate sodium (COLACE) 100 MG capsule Take 100 mg by mouth 3 (three) times daily.    [provider]  donepezil (ARICEPT) 10 MG tablet Take 10 mg by mouth at bedtime.    [provider]  famotidine (PEPCID) 20 MG tablet Take 20 mg by mouth at bedtime.    [provider]  HYDROcodone-acetaminophen  (NORCO/VICODIN) 5-325 MG tablet Take 1 tablet by mouth every 4 (four) hours as needed for moderate pain. 07/14/22   Leatha Gilding, MD  lactulose (CHRONULAC) 10 GM/15ML solution Take 20 g by mouth in the morning.    [provider]  levETIRAcetam (KEPPRA) 500 MG tablet Take 1 tablet (500 mg total) by mouth 2 (two) times daily. 07/14/22   Leatha Gilding, MD  lithium carbonate 150 MG capsule Take 150-300 mg by mouth See admin instructions. 2 entries on MAR: 150 mg every morning, 300 mg at bedtime    [provider]  metoprolol succinate (TOPROL-XL) 25 MG 24 hr tablet Take 25 mg by mouth in the morning.    [provider]  mirabegron ER (MYRBETRIQ) 50 MG TB24 tablet Take 50 mg by mouth in the morning.    [provider]  polycarbophil (FIBER-LAX) 625 MG tablet Take 625 mg by mouth 2 (two) times daily.    [provider]  pravastatin (PRAVACHOL) 20 MG tablet Take 20 mg by mouth at bedtime.    [provider]  risperiDONE (RISPERDAL) 3 MG tablet Take 3 mg by mouth at bedtime.    [provider]  Salicylic Acid (SELSUN BLUE NATURALS DRY SCALP) 3 % SHAM Apply 1 application  topically See admin instructions. Apply to scalp on shower days (Monday, Wednesday)    [provider]  senna (SENOKOT) 8.6 MG TABS tablet Take 17.2 mg by mouth at bedtime.    [provider]  sertraline (ZOLOFT) 100 MG tablet Take 100 mg by mouth in the morning.    [provider]  traZODone (DESYREL) 50 MG tablet Take 50 mg by mouth at bedtime.    [provider]                                                                                                                                    Past Surgical History Past Surgical History:  Procedure Laterality Date   APPLICATION OF CRANIAL NAVIGATION Left 07/08/2022   Procedure: APPLICATION OF CRANIAL NAVIGATION;  Surgeon: Bedelia Person, MD;  Location: [facility] OR;  Service:  Neurosurgery;  Laterality: Left;   CRANIOTOMY Left 07/08/2022   Procedure: PARIETAL CRANIOTOMY TUMOR EXCISION;  Surgeon: Bedelia Person, MD;  Location: [facility] OR;  Service: Neurosurgery;  Laterality: Left;   Family History History reviewed. No pertinent family history.  Social History Social History   Tobacco Use   Smoking status: Never   Smokeless tobacco: Never  Substance Use Topics   Alcohol use: No   Drug use: Never   Allergies Patient has no known allergies.  Review of Systems Review of Systems  Physical Exam Vital Signs  I have reviewed the triage vital signs BP 124/64   Pulse 70   Temp 97.9 F (36.6 C) (Oral) Comment (Src): Patient would not keep mouth closed.  Resp 20   SpO2 100%   Physical Exam Cardiovascular:     Rate and Rhythm: Normal rate.  Neurological:     Mental Status: She is alert. Mental status is at baseline.     Motor: No weakness.     Gait: Gait normal.  Psychiatric:        Mood and Affect: Mood normal.     ED Results and Treatments Labs (all labs ordered are listed, but only abnormal results are displayed) Labs Reviewed  CBC WITH DIFFERENTIAL/PLATELET  BASIC METABOLIC PANEL  LITHIUM LEVEL                                                                                                                          Radiology No results found.  Pertinent labs & imaging results that were available during my care of the patient were reviewed by me and considered in my medical decision making (see MDM for details).  Medications Ordered in ED Medications - No data to display  Procedures Procedures  (including critical care time)  Medical Decision Making / ED Course   This patient presents to the ED for concern of abnormal gait, this involves an extensive number of treatment options, and is a complaint that  carries with it a high risk of complications and morbidity.  The differential diagnosis includes deconditioning, less likely fracture, dementia, intracranial hemorrhage.  MDM: 72 year old female with a history of dementia, multiple psychiatric disorders as evidenced by being on lithium and Clozaril, here today for abnormal gait.  I was able to stand the patient up without any difficulty.  She was able to ambulate in the room for me.  Looking through her chart, patient has a history of meningioma, had a craniotomy 1 year ago.  Will obtain imaging of the patient's head.  CBC and BMP ordered.  Anticipate discharge back to the patient's memory care unit, as that is the appropriate level of care for her.  I was able to interview the patient with a video [place] interpreter.  We are able to confirm that this is indeed the patient's language.  She denied any pain to me.  She was very pleasant.  Attempted to call [facility] 2 times, unfortunately the connection was lost 3 times without me being able to speak to anyone.  Additional history obtained: -Additional history obtained from EMS. -External records from outside source obtained and reviewed including: Chart review including previous notes, labs, imaging, consultation notes   Lab Tests: -I ordered, reviewed, and interpreted labs.   The pertinent results include:   Labs Reviewed  CBC WITH DIFFERENTIAL/PLATELET  BASIC METABOLIC PANEL  LITHIUM LEVEL      EKG   EKG Interpretation Date/Time:    Ventricular Rate:    PR Interval:    QRS Duration:    QT Interval:    QTC Calculation:   R Axis:      Text Interpretation:           Imaging Studies ordered: I ordered imaging studies including head CT. I independently visualized and interpreted imaging. I agree with the radiologist interpretation   Medicines ordered and prescription drug management: No orders of the defined types were placed in this encounter.   -I have  reviewed the patients home medicines and have made adjustments as needed   Cardiac Monitoring: The patient was maintained on a cardiac monitor.  I personally viewed and interpreted the cardiac monitored which showed an underlying rhythm of: Normal sinus rhythm  Social Determinants of Health:  Factors impacting patients care include: Nursing home resident, dementia   Reevaluation: After the interventions noted above, I reevaluated the patient and found that they have :improved  Co morbidities that complicate the patient evaluation  Past Medical History:  Diagnosis Date   Dementia (HCC)    Diabetes mellitus without complication (HCC)    Hyperlipidemia    Hypertension       Dispostion: Patient signed out to Dr. Criss Alvine pending BMP and head CT read.     Final Clinical Impression(s) / ED Diagnoses Final diagnoses:  None     @PCDICTATION @    Anders Simmonds T, DO 05/20/23 1632

## 2023-05-20 NOTE — ED Provider Notes (Addendum)
Care transferred to me.  Metabolic panel is unremarkable.  CT head with no head bleed or mass.  Will send back to her facility per prior plan. Of note, she ambulated without difficulty with Dr. Andria Meuse.    Pricilla Loveless, MD 05/20/23 [phone]

## 2023-05-28 ENCOUNTER — Other Ambulatory Visit: Payer: Self-pay

## 2023-05-28 ENCOUNTER — Emergency Department (HOSPITAL_COMMUNITY)
Admission: EM | Admit: 2023-05-28 | Discharge: 2023-05-28 | Disposition: A | Discharge status: Home / Self Care | Payer: Medicare Other | Attending: Emergency Medicine | Admitting: Emergency Medicine

## 2023-05-28 ENCOUNTER — Encounter (HOSPITAL_COMMUNITY): Payer: Self-pay

## 2023-05-28 DIAGNOSIS — E119 Type 2 diabetes mellitus without complications: Secondary | ICD-10-CM | POA: Insufficient documentation

## 2023-05-28 DIAGNOSIS — N814 Uterovaginal prolapse, unspecified: Secondary | ICD-10-CM | POA: Insufficient documentation

## 2023-05-28 DIAGNOSIS — F0390 Unspecified dementia without behavioral disturbance: Secondary | ICD-10-CM | POA: Insufficient documentation

## 2023-05-28 DIAGNOSIS — N939 Abnormal uterine and vaginal bleeding, unspecified: Secondary | ICD-10-CM | POA: Diagnosis present

## 2023-05-28 NOTE — Discharge Instructions (Addendum)
Apply lubricating ointment such as surgilube to the prolapse to help prevent irritation to the vaginal tissue.  Follow up with your OB GYN doctor.  Follow up with the [facility] GYN doctor to discuss possible a pessary.  I did review this information with Hilda Lias and Crist Infante

## 2023-05-28 NOTE — ED Provider Notes (Signed)
[place] EMERGENCY DEPARTMENT AT [facility] Provider Note   CSN: [id number] Arrival date & time: 05/28/23  1024     History  Chief Complaint  Patient presents with   Vaginal Bleeding    Jenna Powell is a 72 y.o. female.   Vaginal Bleeding    Patient has a history of dementia bipolar disorder diabetes seizures uterine prolapse.  [place] translator used during the patient's evaluation.  Patient was speaking continuously.  The translator however said the patient was not saying anything that made sense.  Patient's had a nursing facility.  She has chronic uterine prolapse issues.  They noted that she started to have some bleeding.  She was sent to the ED for evaluation.  Home Medications Prior to Admission medications   Medication Sig Start Date End Date Taking? Authorizing Provider  benztropine (COGENTIN) 1 MG tablet Take 1 tablet (1 mg total) by mouth 2 (two) times daily. 11/23/18   [place], Alvira Philips, DO  Calcium Carb-Cholecalciferol (CALCIUM + VITAMIN D3) 600-10 MG-MCG TABS Take 1 tablet by mouth every 12 (twelve) hours.    [provider]  Cholecalciferol 50 MCG (2000 UT) CAPS Take 2,000 Units by mouth in the morning.    [provider]  clozapine (CLOZARIL) 50 MG tablet Take 75 mg by mouth 2 (two) times daily.    [provider]  Dextran 70-Hypromellose, PF, (GENTEAL TEARS MODERATE PF) 0.1-0.3 % SOLN Place 1 drop into both eyes 3 (three) times daily.    [provider]  docusate sodium (COLACE) 100 MG capsule Take 100 mg by mouth 3 (three) times daily.    [provider]  donepezil (ARICEPT) 10 MG tablet Take 10 mg by mouth at bedtime.    [provider]  famotidine (PEPCID) 20 MG tablet Take 20 mg by mouth at bedtime.    [provider]  HYDROcodone-acetaminophen (NORCO/VICODIN) 5-325 MG tablet Take 1 tablet by mouth every 4 (four) hours as needed for moderate pain. 07/14/22   Leatha Gilding, MD   lactulose (CHRONULAC) 10 GM/15ML solution Take 20 g by mouth in the morning.    [provider]  levETIRAcetam (KEPPRA) 500 MG tablet Take 1 tablet (500 mg total) by mouth 2 (two) times daily. 07/14/22   Leatha Gilding, MD  lithium carbonate 150 MG capsule Take 150-300 mg by mouth See admin instructions. 2 entries on MAR: 150 mg every morning, 300 mg at bedtime    [provider]  metoprolol succinate (TOPROL-XL) 25 MG 24 hr tablet Take 25 mg by mouth in the morning.    [provider]  mirabegron ER (MYRBETRIQ) 50 MG TB24 tablet Take 50 mg by mouth in the morning.    [provider]  polycarbophil (FIBER-LAX) 625 MG tablet Take 625 mg by mouth 2 (two) times daily.    [provider]  pravastatin (PRAVACHOL) 20 MG tablet Take 20 mg by mouth at bedtime.    [provider]  risperiDONE (RISPERDAL) 3 MG tablet Take 3 mg by mouth at bedtime.    [provider]  Salicylic Acid (SELSUN BLUE NATURALS DRY SCALP) 3 % SHAM Apply 1 application  topically See admin instructions. Apply to scalp on shower days (Monday, Wednesday)    [provider]  senna (SENOKOT) 8.6 MG TABS tablet Take 17.2 mg by mouth at bedtime.    [provider]  sertraline (ZOLOFT) 100 MG tablet Take 100 mg by mouth in the morning.  [provider]  traZODone (DESYREL) 50 MG tablet Take 50 mg by mouth at bedtime.    [provider]      Allergies    Patient has no known allergies.    Review of Systems   Review of Systems  Genitourinary:  Positive for vaginal bleeding.    Physical Exam Updated Vital Signs BP (!) 117/59 (BP Location: Left Arm)   Pulse 62   Temp 98.2 F (36.8 C)   Resp 12   Ht 1.549 m (5\' 1" )   Wt 101.1 kg   SpO2 100%   BMI 42.11 kg/m  Physical Exam Vitals and nursing note reviewed.  Constitutional:      General: She is not in acute distress.    Appearance: She is well-developed.  HENT:     Head:  Normocephalic and atraumatic.     Right Ear: External ear normal.     Left Ear: External ear normal.  Eyes:     General: No scleral icterus.       Right eye: No discharge.        Left eye: No discharge.     Conjunctiva/sclera: Conjunctivae normal.  Neck:     Trachea: No tracheal deviation.  Cardiovascular:     Rate and Rhythm: Normal rate.  Pulmonary:     Effort: Pulmonary effort is normal. No respiratory distress.     Breath sounds: No stridor.  Abdominal:     General: There is no distension.     Tenderness: There is no abdominal tenderness. There is no guarding.  Genitourinary:    Comments: Large uterine prolapse noted, blood tinge noted to the patient's diaper, small area of breakdown and irritation the site of the uterine prolapse, no tenderness to palpation, able to reduce the prolapse without difficulty Musculoskeletal:        General: No swelling or deformity.     Cervical back: Neck supple.  Skin:    General: Skin is warm and dry.     Findings: No rash.  Neurological:     Mental Status: She is alert. Mental status is at baseline.     Cranial Nerves: No dysarthria or facial asymmetry.     Motor: No seizure activity.     ED Results / Procedures / Treatments   Labs (all labs ordered are listed, but only abnormal results are displayed) Labs Reviewed - No data to display  EKG None  Radiology No results found.  Procedures Procedures    Medications Ordered in ED Medications - No data to display  ED Course/ Medical Decision Making/ A&P Clinical Course as of 05/28/23 1112  Thu May 28, 2023  1101 Updated pt's son, requested I speak with his wife. [phone] [JK]  1109 Reviewed OB/GYN outpatient notes with the patient's daughter-in-law.  Patient was supposed to follow-up to have a pessary inserted.  Patient did not follow-up for that appointment.  That note was dated back in 2023.  I reviewed this with the patient's daughter-in-law.  They will contact the OB/GYN  doctor that she previously saw [JK]    Clinical Course User Index [JK] Linwood Dibbles, MD                                 Medical Decision Making  Patient presented to the ED with recurrent uterine prolapse.  This is a known issue for her.  Patient was seen in the ED  a couple years ago for similar episode.  Patient was seen in the emergency room on October 16.  She did have a CBC at that time and her hemoglobin was normal.  Patient does not appear to have any signs of infection today.  She otherwise appears well.  She does have a large uterine prolapse with irritation of the skin.  The tissue did appear to be dry and atrophic.  Doubt that patient would be a surgical candidate.   I did review outpatient notes and discussed the patient's case with her son and daughter-in-law.  Notes indicate she did follow-up with an OB/GYN doctor in 2022.  She was supposed to return to have a pessary placed.  There are notes indicating an attempt to schedule an appointment back in May 2023.  I discussed this with the daughter-in-law and she was not aware that the patient was supposed to return.  She will try to schedule an outpatient follow-up appointment with that OB/GYN doctor Will recommend topical lubricating ointments/jelly to help keep the tissue moist, patient is stable for discharge and follow-up with her OB/GYN.        Final Clinical Impression(s) / ED Diagnoses Final diagnoses:  Uterine prolapse    Rx / DC Orders ED Discharge Orders     None         Linwood Dibbles, MD 05/28/23 1112

## 2023-05-28 NOTE — ED Triage Notes (Signed)
Pt came from [facility]. They called EMS for prolapsed uterus and bleeding. They say this happens daily and they normally fix it there. But because of the bleeding today they sent her out.  BP 138/88 HR 84 O2 94% CBG 145

## 2023-05-28 NOTE — ED Triage Notes (Signed)
Patient speaks [place] but patient has dementia and according to interpreter she isn't making sense

## 2023-05-28 NOTE — ED Notes (Signed)
Called [facility]
